# Patient Record
Sex: Female | Born: 1960 | Race: White | Hispanic: No | Marital: Married | Smoking: Former smoker
Health system: Southern US, Community
[De-identification: ages and names within clinical notes are randomized; demographics above are authoritative.]

## PROBLEM LIST (undated history)

## (undated) DIAGNOSIS — R011 Cardiac murmur, unspecified: Secondary | ICD-10-CM

## (undated) DIAGNOSIS — R112 Nausea with vomiting, unspecified: Secondary | ICD-10-CM

## (undated) DIAGNOSIS — Z9889 Other specified postprocedural states: Secondary | ICD-10-CM

## (undated) DIAGNOSIS — E559 Vitamin D deficiency, unspecified: Secondary | ICD-10-CM

## (undated) DIAGNOSIS — G4730 Sleep apnea, unspecified: Secondary | ICD-10-CM

## (undated) DIAGNOSIS — I10 Essential (primary) hypertension: Secondary | ICD-10-CM

## (undated) DIAGNOSIS — M549 Dorsalgia, unspecified: Secondary | ICD-10-CM

## (undated) DIAGNOSIS — F32A Depression, unspecified: Secondary | ICD-10-CM

## (undated) DIAGNOSIS — K219 Gastro-esophageal reflux disease without esophagitis: Secondary | ICD-10-CM

## (undated) DIAGNOSIS — E785 Hyperlipidemia, unspecified: Secondary | ICD-10-CM

## (undated) DIAGNOSIS — Z8619 Personal history of other infectious and parasitic diseases: Secondary | ICD-10-CM

## (undated) DIAGNOSIS — F419 Anxiety disorder, unspecified: Secondary | ICD-10-CM

## (undated) DIAGNOSIS — E042 Nontoxic multinodular goiter: Secondary | ICD-10-CM

## (undated) DIAGNOSIS — Z972 Presence of dental prosthetic device (complete) (partial): Secondary | ICD-10-CM

## (undated) DIAGNOSIS — T7840XA Allergy, unspecified, initial encounter: Secondary | ICD-10-CM

## (undated) DIAGNOSIS — G8929 Other chronic pain: Secondary | ICD-10-CM

## (undated) DIAGNOSIS — F329 Major depressive disorder, single episode, unspecified: Secondary | ICD-10-CM

## (undated) DIAGNOSIS — G43909 Migraine, unspecified, not intractable, without status migrainosus: Secondary | ICD-10-CM

## (undated) DIAGNOSIS — T753XXA Motion sickness, initial encounter: Secondary | ICD-10-CM

## (undated) DIAGNOSIS — M797 Fibromyalgia: Secondary | ICD-10-CM

## (undated) DIAGNOSIS — M5030 Other cervical disc degeneration, unspecified cervical region: Secondary | ICD-10-CM

## (undated) HISTORY — DX: Essential (primary) hypertension: I10

## (undated) HISTORY — DX: Migraine, unspecified, not intractable, without status migrainosus: G43.909

## (undated) HISTORY — DX: Nontoxic multinodular goiter: E04.2

## (undated) HISTORY — DX: Personal history of other infectious and parasitic diseases: Z86.19

## (undated) HISTORY — DX: Cardiac murmur, unspecified: R01.1

## (undated) HISTORY — PX: TONSILLECTOMY: SUR1361

## (undated) HISTORY — PX: TOTAL ABDOMINAL HYSTERECTOMY: SHX209

## (undated) HISTORY — DX: Gastro-esophageal reflux disease without esophagitis: K21.9

## (undated) HISTORY — DX: Other chronic pain: G89.29

## (undated) HISTORY — DX: Major depressive disorder, single episode, unspecified: F32.9

## (undated) HISTORY — PX: KNEE SURGERY: SHX244

## (undated) HISTORY — DX: Fibromyalgia: M79.7

## (undated) HISTORY — PX: LAPAROSCOPY: SHX197

## (undated) HISTORY — DX: Sleep apnea, unspecified: G47.30

## (undated) HISTORY — DX: Hyperlipidemia, unspecified: E78.5

## (undated) HISTORY — PX: CARPAL TUNNEL RELEASE: SHX101

## (undated) HISTORY — PX: ANTERIOR CERVICAL DECOMP/DISCECTOMY FUSION: SHX1161

## (undated) HISTORY — DX: Depression, unspecified: F32.A

## (undated) HISTORY — PX: HERNIA REPAIR: SHX51

## (undated) HISTORY — PX: NISSEN FUNDOPLICATION: SHX2091

## (undated) HISTORY — PX: NECK SURGERY: SHX720

## (undated) HISTORY — DX: Anxiety disorder, unspecified: F41.9

## (undated) HISTORY — PX: BLADDER SUSPENSION: SHX72

## (undated) HISTORY — PX: CHOLECYSTECTOMY: SHX55

## (undated) HISTORY — DX: Other cervical disc degeneration, unspecified cervical region: M50.30

## (undated) HISTORY — DX: Vitamin D deficiency, unspecified: E55.9

## (undated) HISTORY — DX: Allergy, unspecified, initial encounter: T78.40XA

## (undated) HISTORY — DX: Dorsalgia, unspecified: M54.9

---

## 2003-08-02 ENCOUNTER — Other Ambulatory Visit: Payer: Self-pay

## 2004-04-27 ENCOUNTER — Ambulatory Visit: Payer: Self-pay | Admitting: Obstetrics and Gynecology

## 2004-06-09 ENCOUNTER — Ambulatory Visit: Payer: Self-pay | Admitting: Obstetrics and Gynecology

## 2004-06-16 ENCOUNTER — Ambulatory Visit: Payer: Self-pay | Admitting: Pain Medicine

## 2004-06-24 ENCOUNTER — Ambulatory Visit: Payer: Self-pay | Admitting: Pain Medicine

## 2004-08-13 ENCOUNTER — Ambulatory Visit: Payer: Self-pay | Admitting: Pain Medicine

## 2004-08-19 ENCOUNTER — Ambulatory Visit: Payer: Self-pay | Admitting: Pain Medicine

## 2004-09-01 ENCOUNTER — Encounter: Admission: RE | Admit: 2004-09-01 | Discharge: 2004-09-01 | Payer: Self-pay | Admitting: Neurological Surgery

## 2004-09-08 ENCOUNTER — Ambulatory Visit: Payer: Self-pay | Admitting: Pain Medicine

## 2004-09-09 ENCOUNTER — Ambulatory Visit: Payer: Self-pay | Admitting: Pain Medicine

## 2004-09-16 ENCOUNTER — Ambulatory Visit (HOSPITAL_COMMUNITY): Admission: RE | Admit: 2004-09-16 | Discharge: 2004-09-16 | Payer: Self-pay | Admitting: Neurological Surgery

## 2004-09-28 ENCOUNTER — Encounter: Admission: RE | Admit: 2004-09-28 | Discharge: 2004-09-28 | Payer: Self-pay | Admitting: Neurological Surgery

## 2004-10-08 ENCOUNTER — Ambulatory Visit: Payer: Self-pay | Admitting: Pain Medicine

## 2004-10-14 ENCOUNTER — Ambulatory Visit: Payer: Self-pay | Admitting: Pain Medicine

## 2004-10-29 ENCOUNTER — Ambulatory Visit (HOSPITAL_COMMUNITY): Admission: RE | Admit: 2004-10-29 | Discharge: 2004-10-29 | Payer: Self-pay | Admitting: Neurological Surgery

## 2004-11-19 ENCOUNTER — Ambulatory Visit: Payer: Self-pay | Admitting: Neurological Surgery

## 2004-12-08 ENCOUNTER — Ambulatory Visit: Payer: Self-pay | Admitting: Pain Medicine

## 2004-12-16 ENCOUNTER — Ambulatory Visit: Payer: Self-pay | Admitting: Pain Medicine

## 2004-12-31 ENCOUNTER — Ambulatory Visit: Payer: Self-pay | Admitting: Pain Medicine

## 2005-01-13 ENCOUNTER — Ambulatory Visit: Payer: Self-pay | Admitting: Pain Medicine

## 2005-01-28 ENCOUNTER — Ambulatory Visit: Payer: Self-pay | Admitting: Pain Medicine

## 2005-02-01 ENCOUNTER — Ambulatory Visit: Payer: Self-pay | Admitting: Pain Medicine

## 2005-02-23 ENCOUNTER — Ambulatory Visit: Payer: Self-pay | Admitting: Pain Medicine

## 2005-02-24 ENCOUNTER — Ambulatory Visit: Payer: Self-pay | Admitting: Pain Medicine

## 2005-03-22 ENCOUNTER — Ambulatory Visit: Payer: Self-pay | Admitting: Pain Medicine

## 2005-04-06 ENCOUNTER — Ambulatory Visit: Payer: Self-pay | Admitting: Pain Medicine

## 2005-04-14 ENCOUNTER — Ambulatory Visit: Payer: Self-pay | Admitting: Pain Medicine

## 2005-04-15 ENCOUNTER — Ambulatory Visit: Payer: Self-pay | Admitting: Pain Medicine

## 2005-05-04 ENCOUNTER — Ambulatory Visit: Payer: Self-pay | Admitting: Pain Medicine

## 2005-05-11 ENCOUNTER — Ambulatory Visit: Payer: Self-pay | Admitting: Orthopaedic Surgery

## 2005-06-03 ENCOUNTER — Ambulatory Visit: Payer: Self-pay | Admitting: Pain Medicine

## 2005-06-16 ENCOUNTER — Ambulatory Visit: Payer: Self-pay | Admitting: Pain Medicine

## 2005-06-24 ENCOUNTER — Ambulatory Visit: Payer: Self-pay | Admitting: Family Medicine

## 2005-07-01 ENCOUNTER — Ambulatory Visit: Payer: Self-pay | Admitting: Pain Medicine

## 2005-07-14 ENCOUNTER — Ambulatory Visit: Payer: Self-pay | Admitting: Pain Medicine

## 2005-08-03 ENCOUNTER — Ambulatory Visit: Payer: Self-pay | Admitting: Pain Medicine

## 2005-08-03 ENCOUNTER — Ambulatory Visit: Payer: Self-pay | Admitting: Neurological Surgery

## 2005-08-25 ENCOUNTER — Ambulatory Visit: Payer: Self-pay | Admitting: Pain Medicine

## 2005-09-28 ENCOUNTER — Ambulatory Visit: Payer: Self-pay | Admitting: Pain Medicine

## 2005-10-06 ENCOUNTER — Ambulatory Visit: Payer: Self-pay | Admitting: Pain Medicine

## 2005-10-26 ENCOUNTER — Ambulatory Visit: Payer: Self-pay | Admitting: Pain Medicine

## 2005-11-08 ENCOUNTER — Ambulatory Visit: Payer: Self-pay | Admitting: Pain Medicine

## 2005-11-30 ENCOUNTER — Ambulatory Visit: Payer: Self-pay | Admitting: Pain Medicine

## 2005-12-29 ENCOUNTER — Ambulatory Visit: Payer: Self-pay | Admitting: Pain Medicine

## 2006-02-01 ENCOUNTER — Ambulatory Visit: Payer: Self-pay | Admitting: Pain Medicine

## 2006-02-09 ENCOUNTER — Ambulatory Visit: Payer: Self-pay | Admitting: Pain Medicine

## 2006-03-02 ENCOUNTER — Ambulatory Visit: Payer: Self-pay | Admitting: Pain Medicine

## 2006-03-24 ENCOUNTER — Ambulatory Visit: Payer: Self-pay | Admitting: Unknown Physician Specialty

## 2006-03-29 ENCOUNTER — Ambulatory Visit: Payer: Self-pay | Admitting: Pain Medicine

## 2006-04-04 ENCOUNTER — Ambulatory Visit: Payer: Self-pay | Admitting: Unknown Physician Specialty

## 2006-04-13 ENCOUNTER — Ambulatory Visit: Payer: Self-pay | Admitting: Surgery

## 2006-04-26 ENCOUNTER — Ambulatory Visit: Payer: Self-pay | Admitting: Surgery

## 2006-05-03 ENCOUNTER — Ambulatory Visit: Payer: Self-pay | Admitting: Pain Medicine

## 2006-05-11 ENCOUNTER — Ambulatory Visit: Payer: Self-pay | Admitting: Pain Medicine

## 2006-06-02 ENCOUNTER — Ambulatory Visit: Payer: Self-pay | Admitting: Pain Medicine

## 2006-06-06 ENCOUNTER — Ambulatory Visit: Payer: Self-pay | Admitting: Pain Medicine

## 2006-07-02 ENCOUNTER — Emergency Department: Payer: Self-pay | Admitting: Internal Medicine

## 2006-07-11 ENCOUNTER — Ambulatory Visit: Payer: Self-pay | Admitting: Pain Medicine

## 2006-07-13 ENCOUNTER — Ambulatory Visit: Payer: Self-pay | Admitting: Unknown Physician Specialty

## 2006-08-08 ENCOUNTER — Ambulatory Visit: Payer: Self-pay | Admitting: Neurology

## 2006-08-08 ENCOUNTER — Ambulatory Visit: Payer: Self-pay | Admitting: Pain Medicine

## 2006-08-17 ENCOUNTER — Ambulatory Visit: Payer: Self-pay | Admitting: Pain Medicine

## 2006-09-06 ENCOUNTER — Ambulatory Visit: Payer: Self-pay | Admitting: Pain Medicine

## 2006-09-14 ENCOUNTER — Ambulatory Visit: Payer: Self-pay | Admitting: Pain Medicine

## 2006-09-29 ENCOUNTER — Ambulatory Visit: Payer: Self-pay | Admitting: Family Medicine

## 2006-10-04 ENCOUNTER — Ambulatory Visit: Payer: Self-pay | Admitting: Pain Medicine

## 2006-10-12 ENCOUNTER — Ambulatory Visit: Payer: Self-pay | Admitting: Pain Medicine

## 2006-11-03 ENCOUNTER — Ambulatory Visit: Payer: Self-pay | Admitting: Pain Medicine

## 2006-11-09 ENCOUNTER — Ambulatory Visit: Payer: Self-pay | Admitting: Pain Medicine

## 2006-12-01 ENCOUNTER — Ambulatory Visit: Payer: Self-pay | Admitting: Pain Medicine

## 2006-12-14 ENCOUNTER — Ambulatory Visit: Payer: Self-pay | Admitting: Pain Medicine

## 2006-12-27 ENCOUNTER — Ambulatory Visit: Payer: Self-pay | Admitting: Pain Medicine

## 2007-01-11 ENCOUNTER — Ambulatory Visit: Payer: Self-pay | Admitting: Pain Medicine

## 2007-01-30 ENCOUNTER — Ambulatory Visit: Payer: Self-pay | Admitting: Pain Medicine

## 2007-02-23 ENCOUNTER — Ambulatory Visit: Payer: Self-pay | Admitting: Pain Medicine

## 2007-02-27 ENCOUNTER — Ambulatory Visit: Payer: Self-pay | Admitting: Pain Medicine

## 2007-03-30 ENCOUNTER — Ambulatory Visit: Payer: Self-pay | Admitting: Pain Medicine

## 2007-04-12 ENCOUNTER — Ambulatory Visit: Payer: Self-pay | Admitting: Pain Medicine

## 2007-04-27 ENCOUNTER — Ambulatory Visit: Payer: Self-pay | Admitting: Pain Medicine

## 2007-05-08 ENCOUNTER — Ambulatory Visit: Payer: Self-pay | Admitting: Unknown Physician Specialty

## 2007-05-22 ENCOUNTER — Ambulatory Visit: Payer: Self-pay | Admitting: Pain Medicine

## 2007-06-14 ENCOUNTER — Other Ambulatory Visit: Payer: Self-pay

## 2007-06-14 ENCOUNTER — Ambulatory Visit: Payer: Self-pay | Admitting: Surgery

## 2007-06-19 ENCOUNTER — Inpatient Hospital Stay: Payer: Self-pay | Admitting: Surgery

## 2007-06-26 ENCOUNTER — Ambulatory Visit: Payer: Self-pay | Admitting: Pain Medicine

## 2007-07-27 ENCOUNTER — Ambulatory Visit: Payer: Self-pay | Admitting: Pain Medicine

## 2007-07-31 ENCOUNTER — Ambulatory Visit: Payer: Self-pay | Admitting: Pain Medicine

## 2007-08-17 ENCOUNTER — Ambulatory Visit: Payer: Self-pay | Admitting: Pain Medicine

## 2007-08-28 ENCOUNTER — Ambulatory Visit: Payer: Self-pay | Admitting: Pain Medicine

## 2007-09-26 ENCOUNTER — Ambulatory Visit: Payer: Self-pay | Admitting: Pain Medicine

## 2007-10-02 ENCOUNTER — Ambulatory Visit: Payer: Self-pay | Admitting: Pain Medicine

## 2007-10-16 ENCOUNTER — Ambulatory Visit: Payer: Self-pay | Admitting: Pain Medicine

## 2007-11-01 ENCOUNTER — Ambulatory Visit: Payer: Self-pay | Admitting: Pain Medicine

## 2007-12-07 ENCOUNTER — Ambulatory Visit: Payer: Self-pay | Admitting: Family Medicine

## 2007-12-19 ENCOUNTER — Ambulatory Visit: Payer: Self-pay | Admitting: Pain Medicine

## 2007-12-25 ENCOUNTER — Ambulatory Visit: Payer: Self-pay | Admitting: Pain Medicine

## 2008-01-03 ENCOUNTER — Ambulatory Visit: Payer: Self-pay | Admitting: Pain Medicine

## 2008-01-23 ENCOUNTER — Ambulatory Visit: Payer: Self-pay | Admitting: Pain Medicine

## 2008-02-07 ENCOUNTER — Ambulatory Visit: Payer: Self-pay | Admitting: Pain Medicine

## 2008-02-22 ENCOUNTER — Ambulatory Visit: Payer: Self-pay | Admitting: Pain Medicine

## 2008-02-28 ENCOUNTER — Ambulatory Visit: Payer: Self-pay | Admitting: Pain Medicine

## 2008-03-21 ENCOUNTER — Ambulatory Visit: Payer: Self-pay | Admitting: Pain Medicine

## 2008-04-10 ENCOUNTER — Ambulatory Visit: Payer: Self-pay | Admitting: Pain Medicine

## 2008-05-28 ENCOUNTER — Ambulatory Visit: Payer: Self-pay | Admitting: Pain Medicine

## 2008-06-03 ENCOUNTER — Ambulatory Visit: Payer: Self-pay | Admitting: Pain Medicine

## 2008-06-25 ENCOUNTER — Ambulatory Visit: Payer: Self-pay | Admitting: Pain Medicine

## 2008-07-03 ENCOUNTER — Ambulatory Visit: Payer: Self-pay | Admitting: Pain Medicine

## 2008-08-01 ENCOUNTER — Ambulatory Visit: Payer: Self-pay | Admitting: Pain Medicine

## 2008-08-07 ENCOUNTER — Ambulatory Visit: Payer: Self-pay | Admitting: Pain Medicine

## 2008-09-10 ENCOUNTER — Ambulatory Visit: Payer: Self-pay | Admitting: Pain Medicine

## 2008-10-22 ENCOUNTER — Ambulatory Visit: Payer: Self-pay | Admitting: Pain Medicine

## 2008-10-30 ENCOUNTER — Ambulatory Visit: Payer: Self-pay | Admitting: Pain Medicine

## 2008-11-21 ENCOUNTER — Ambulatory Visit: Payer: Self-pay | Admitting: Pain Medicine

## 2008-12-04 ENCOUNTER — Ambulatory Visit: Payer: Self-pay | Admitting: Pain Medicine

## 2008-12-19 ENCOUNTER — Ambulatory Visit: Payer: Self-pay | Admitting: Pain Medicine

## 2009-01-01 ENCOUNTER — Ambulatory Visit: Payer: Self-pay | Admitting: Family Medicine

## 2009-01-13 ENCOUNTER — Ambulatory Visit: Payer: Self-pay | Admitting: Family Medicine

## 2009-01-16 ENCOUNTER — Ambulatory Visit: Payer: Self-pay | Admitting: Pain Medicine

## 2009-01-17 ENCOUNTER — Ambulatory Visit: Payer: Self-pay | Admitting: Pain Medicine

## 2009-03-04 ENCOUNTER — Ambulatory Visit: Payer: Self-pay | Admitting: Pain Medicine

## 2009-03-12 ENCOUNTER — Ambulatory Visit: Payer: Self-pay | Admitting: Pain Medicine

## 2009-04-03 ENCOUNTER — Ambulatory Visit: Payer: Self-pay | Admitting: Pain Medicine

## 2009-04-24 ENCOUNTER — Ambulatory Visit: Payer: Self-pay | Admitting: Pain Medicine

## 2009-05-27 ENCOUNTER — Ambulatory Visit: Payer: Self-pay | Admitting: Pain Medicine

## 2009-07-16 ENCOUNTER — Ambulatory Visit: Payer: Self-pay | Admitting: Pain Medicine

## 2009-08-12 ENCOUNTER — Ambulatory Visit: Payer: Self-pay | Admitting: Pain Medicine

## 2009-08-20 ENCOUNTER — Ambulatory Visit: Payer: Self-pay | Admitting: Pain Medicine

## 2009-09-15 ENCOUNTER — Ambulatory Visit: Payer: Self-pay | Admitting: Pain Medicine

## 2009-10-16 ENCOUNTER — Ambulatory Visit: Payer: Self-pay | Admitting: Pain Medicine

## 2009-10-29 ENCOUNTER — Ambulatory Visit: Payer: Self-pay | Admitting: Pain Medicine

## 2009-11-12 ENCOUNTER — Ambulatory Visit: Payer: Self-pay | Admitting: Pain Medicine

## 2009-11-26 ENCOUNTER — Ambulatory Visit: Payer: Self-pay | Admitting: Pain Medicine

## 2009-12-11 ENCOUNTER — Ambulatory Visit: Payer: Self-pay | Admitting: Pain Medicine

## 2010-01-21 ENCOUNTER — Ambulatory Visit: Payer: Self-pay | Admitting: Pain Medicine

## 2010-02-18 ENCOUNTER — Ambulatory Visit: Payer: Self-pay | Admitting: Pain Medicine

## 2010-03-18 ENCOUNTER — Ambulatory Visit: Payer: Self-pay | Admitting: Pain Medicine

## 2010-03-23 ENCOUNTER — Ambulatory Visit: Payer: Self-pay | Admitting: Pain Medicine

## 2010-04-28 ENCOUNTER — Ambulatory Visit: Payer: Self-pay | Admitting: Pain Medicine

## 2010-05-11 ENCOUNTER — Ambulatory Visit: Payer: Self-pay | Admitting: Pain Medicine

## 2010-05-26 ENCOUNTER — Ambulatory Visit: Payer: Self-pay | Admitting: Pain Medicine

## 2010-06-03 ENCOUNTER — Ambulatory Visit: Payer: Self-pay | Admitting: Pain Medicine

## 2010-06-25 ENCOUNTER — Ambulatory Visit: Payer: Self-pay | Admitting: Pain Medicine

## 2010-07-08 ENCOUNTER — Ambulatory Visit: Payer: Self-pay | Admitting: Pain Medicine

## 2010-07-30 ENCOUNTER — Ambulatory Visit: Payer: Self-pay | Admitting: Pain Medicine

## 2010-08-27 ENCOUNTER — Ambulatory Visit: Payer: Self-pay | Admitting: Pain Medicine

## 2010-09-09 ENCOUNTER — Ambulatory Visit: Payer: Self-pay | Admitting: Pain Medicine

## 2010-09-24 ENCOUNTER — Ambulatory Visit: Payer: Self-pay | Admitting: Pain Medicine

## 2010-09-24 ENCOUNTER — Ambulatory Visit: Payer: Self-pay | Admitting: Family Medicine

## 2010-10-07 ENCOUNTER — Ambulatory Visit: Payer: Self-pay | Admitting: Family Medicine

## 2010-10-22 ENCOUNTER — Ambulatory Visit: Payer: Self-pay | Admitting: Pain Medicine

## 2010-11-04 ENCOUNTER — Ambulatory Visit: Payer: Self-pay | Admitting: Pain Medicine

## 2010-11-16 ENCOUNTER — Ambulatory Visit: Payer: Self-pay | Admitting: Unknown Physician Specialty

## 2010-11-24 ENCOUNTER — Ambulatory Visit: Payer: Self-pay | Admitting: Pain Medicine

## 2010-12-16 ENCOUNTER — Ambulatory Visit: Payer: Self-pay | Admitting: Pain Medicine

## 2011-01-21 ENCOUNTER — Ambulatory Visit: Payer: Self-pay | Admitting: Pain Medicine

## 2011-02-03 ENCOUNTER — Ambulatory Visit: Payer: Self-pay | Admitting: Pain Medicine

## 2011-02-18 ENCOUNTER — Ambulatory Visit: Payer: Self-pay | Admitting: Pain Medicine

## 2011-03-23 ENCOUNTER — Ambulatory Visit: Payer: Self-pay | Admitting: Pain Medicine

## 2011-04-05 ENCOUNTER — Ambulatory Visit: Payer: Self-pay | Admitting: Unknown Physician Specialty

## 2011-04-07 ENCOUNTER — Ambulatory Visit: Payer: Self-pay | Admitting: Pain Medicine

## 2011-04-13 ENCOUNTER — Ambulatory Visit: Payer: Self-pay | Admitting: Pain Medicine

## 2011-04-20 ENCOUNTER — Ambulatory Visit: Payer: Self-pay | Admitting: Pain Medicine

## 2011-04-26 ENCOUNTER — Ambulatory Visit: Payer: Self-pay | Admitting: Pain Medicine

## 2011-05-31 ENCOUNTER — Ambulatory Visit: Payer: Self-pay | Admitting: Pain Medicine

## 2011-06-07 ENCOUNTER — Ambulatory Visit: Payer: Self-pay | Admitting: Pain Medicine

## 2011-07-13 ENCOUNTER — Ambulatory Visit: Payer: Self-pay | Admitting: Pain Medicine

## 2011-07-26 ENCOUNTER — Ambulatory Visit: Payer: Self-pay | Admitting: Pain Medicine

## 2011-08-13 ENCOUNTER — Ambulatory Visit: Payer: Self-pay | Admitting: Pain Medicine

## 2011-09-27 ENCOUNTER — Ambulatory Visit: Payer: Self-pay | Admitting: Pain Medicine

## 2011-11-03 ENCOUNTER — Ambulatory Visit: Payer: Self-pay | Admitting: Pain Medicine

## 2011-12-02 ENCOUNTER — Ambulatory Visit: Payer: Self-pay | Admitting: Pain Medicine

## 2011-12-15 ENCOUNTER — Ambulatory Visit: Payer: Self-pay | Admitting: Pain Medicine

## 2011-12-30 ENCOUNTER — Ambulatory Visit: Payer: Self-pay | Admitting: Pain Medicine

## 2012-01-24 ENCOUNTER — Ambulatory Visit: Payer: Self-pay | Admitting: Pain Medicine

## 2012-02-22 ENCOUNTER — Ambulatory Visit: Payer: Self-pay | Admitting: Pain Medicine

## 2012-03-01 ENCOUNTER — Ambulatory Visit: Payer: Self-pay | Admitting: Pain Medicine

## 2012-04-18 ENCOUNTER — Ambulatory Visit: Payer: Self-pay | Admitting: Pain Medicine

## 2012-05-11 ENCOUNTER — Ambulatory Visit: Payer: Self-pay | Admitting: Family Medicine

## 2012-06-14 ENCOUNTER — Ambulatory Visit: Payer: Self-pay | Admitting: Pain Medicine

## 2012-06-21 ENCOUNTER — Ambulatory Visit: Payer: Self-pay | Admitting: Pain Medicine

## 2012-07-01 ENCOUNTER — Emergency Department: Payer: Self-pay | Admitting: Emergency Medicine

## 2012-07-01 LAB — COMPREHENSIVE METABOLIC PANEL
Albumin: 4.2 g/dL (ref 3.4–5.0)
Chloride: 101 mmol/L (ref 98–107)
Potassium: 3.9 mmol/L (ref 3.5–5.1)
SGOT(AST): 27 U/L (ref 15–37)
SGPT (ALT): 30 U/L (ref 12–78)
Total Protein: 8.5 g/dL — ABNORMAL HIGH (ref 6.4–8.2)

## 2012-07-01 LAB — CK TOTAL AND CKMB (NOT AT ARMC)
CK, Total: 53 U/L (ref 21–215)
CK-MB: <0.5 ng/mL — ABNORMAL LOW (ref 0.5–3.6)

## 2012-07-01 LAB — CBC
HCT: 43.2 % (ref 35.0–47.0)
HGB: 15 g/dL (ref 12.0–16.0)
MCH: 32.1 pg (ref 26.0–34.0)
MCHC: 34.8 g/dL (ref 32.0–36.0)
RDW: 12.2 % (ref 11.5–14.5)

## 2012-07-01 LAB — TROPONIN I: Troponin-I: <0.02 ng/mL

## 2012-07-02 LAB — TROPONIN I: Troponin-I: <0.02 ng/mL

## 2012-07-18 ENCOUNTER — Ambulatory Visit: Payer: Self-pay | Admitting: Pain Medicine

## 2012-07-26 ENCOUNTER — Ambulatory Visit: Payer: Self-pay | Admitting: Pain Medicine

## 2012-09-06 ENCOUNTER — Ambulatory Visit: Payer: Self-pay | Admitting: Pain Medicine

## 2012-09-27 ENCOUNTER — Ambulatory Visit: Payer: Self-pay | Admitting: Pain Medicine

## 2012-10-02 ENCOUNTER — Emergency Department: Payer: Self-pay | Admitting: Emergency Medicine

## 2012-10-02 LAB — COMPREHENSIVE METABOLIC PANEL
Anion Gap: 10 (ref 7–16)
Calcium, Total: 9.6 mg/dL (ref 8.5–10.1)
Chloride: 105 mmol/L (ref 98–107)
Glucose: 107 mg/dL — ABNORMAL HIGH (ref 65–99)
Osmolality: 275 (ref 275–301)
Potassium: 3.9 mmol/L (ref 3.5–5.1)
SGPT (ALT): 22 U/L (ref 12–78)
Sodium: 137 mmol/L (ref 136–145)

## 2012-10-02 LAB — CBC
HCT: 38.2 % (ref 35.0–47.0)
MCHC: 34.7 g/dL (ref 32.0–36.0)
MCV: 91 fL (ref 80–100)
WBC: 8.5 10*3/uL (ref 3.6–11.0)

## 2012-10-02 LAB — CK TOTAL AND CKMB (NOT AT ARMC)
CK, Total: 63 U/L (ref 21–215)
CK-MB: 0.9 ng/mL (ref 0.5–3.6)

## 2012-10-02 LAB — TROPONIN I: Troponin-I: <0.02 ng/mL

## 2012-10-05 ENCOUNTER — Ambulatory Visit: Payer: Self-pay | Admitting: Family Medicine

## 2012-10-12 ENCOUNTER — Ambulatory Visit: Payer: Self-pay | Admitting: Family Medicine

## 2012-10-18 ENCOUNTER — Encounter: Payer: Self-pay | Admitting: *Deleted

## 2012-10-26 ENCOUNTER — Ambulatory Visit (INDEPENDENT_AMBULATORY_CARE_PROVIDER_SITE_OTHER): Payer: Medicare Other | Admitting: Cardiovascular Disease

## 2012-10-26 ENCOUNTER — Encounter: Payer: Self-pay | Admitting: Cardiovascular Disease

## 2012-10-26 VITALS — BP 140/92 | HR 58 | Ht 71.0 in | Wt 200.2 lb

## 2012-10-26 DIAGNOSIS — R002 Palpitations: Secondary | ICD-10-CM

## 2012-10-26 DIAGNOSIS — R079 Chest pain, unspecified: Secondary | ICD-10-CM | POA: Insufficient documentation

## 2012-10-26 DIAGNOSIS — R000 Tachycardia, unspecified: Secondary | ICD-10-CM

## 2012-10-26 NOTE — Progress Notes (Signed)
Primary care physician: Burnell Blanks, MD  HPI  This is a pleasant 52 year old female who is self-referred for evaluation of palpitations and chest pain. She reports being diagnosed with hypertension at the age of 83 after she had preeclampsia. She has been hypertensive since then. There is strong family history of hypertension at an early age. She reports having scarlet fever at the age of 67 with a heart murmur at that time. However, she reports having an echocardiogram at [facility] with no significant abnormalities. She suffers from fibromyalgia and chronic back pain. She is not aware of other cardiac history. She is known to have hyperlipidemia. She quit smoking about 3 years ago. She has been having increased palpitations mostly at night and morning when she is resting. She describes skipping in her heart followed by a pause. There has been no persistent tachycardia. This has not been associated with dizziness, syncope or presyncope. She had a Holter monitor done recently at [facility] which showed normal sinus rhythm with rare PACs and PVCs. She also complains of upper chest discomfort radiating to her neck described as an aching sensation mostly happening with stress. She is not able to do much physical activity due to chronic back pain. She has mild exertional dyspnea.  Allergies  Allergen Reactions  . Codeine   . Hydrocodone   . Morphine And Related      No current outpatient prescriptions on file prior to visit.   No current facility-administered medications on file prior to visit.     Past Medical History  Diagnosis Date  . GERD (gastroesophageal reflux disease)   . Fibromyalgia   . H/O scarlet fever   . Heart murmur     hx  . Hypertension   . Hyperlipidemia   . Multiple thyroid nodules   . DDD (degenerative disc disease), cervical     lumbar     Past Surgical History  Procedure Laterality Date  . Tonsillectomy    . Laparoscopy    . Total abdominal hysterectomy    .  Cholecystectomy    . Carpal tunnel release Bilateral   . Nissen fundoplication    . Anterior cervical decomp/discectomy fusion    . Hernia repair    . Bladder suspension    . Knee surgery Left      Family History  Problem Relation Age of Onset  . Heart disease Mother   . Heart attack Mother     x2     History   Social History  . Marital Status: Single    Spouse Name: N/A    Number of Children: N/A  . Years of Education: N/A   Occupational History  . Not on file.   Social History Main Topics  . Smoking status: Former Smoker -- 0.50 packs/day for 20 years    Types: Cigarettes  . Smokeless tobacco: Not on file  . Alcohol Use: Yes     Comment: 1 month  . Drug Use: No  . Sexually Active: Not on file   Other Topics Concern  . Not on file   Social History Narrative  . No narrative on file     ROS Constitutional: Negative for fever, chills, diaphoresis, activity change, appetite change and fatigue.  HENT: Negative for hearing loss, nosebleeds, congestion, sore throat, facial swelling, drooling, trouble swallowing, neck pain, voice change, sinus pressure and tinnitus.  Eyes: Negative for photophobia, pain, discharge and visual disturbance.  Respiratory: Negative for apnea, cough and wheezing.  Cardiovascular: Negative for  leg swelling.  Gastrointestinal: Negative for nausea, vomiting, abdominal pain, diarrhea, constipation, blood in stool and abdominal distention.  Genitourinary: Negative for dysuria, urgency, frequency, hematuria and decreased urine volume.  Musculoskeletal: Negative for myalgias, back pain, joint swelling, arthralgias and gait problem.  Skin: Negative for color change, pallor, rash and wound.  Neurological: Negative for dizziness, tremors, seizures, syncope, speech difficulty, weakness, light-headedness, numbness and headaches.  Psychiatric/Behavioral: Negative for suicidal ideas, hallucinations, behavioral problems and agitation. The patient is not  nervous/anxious.     PHYSICAL EXAM   BP 140/92  Pulse 58  Ht 5\' 11"  (1.803 m)  Wt 200 lb 4 oz (90.833 kg)  BMI 27.94 kg/m2 Constitutional: She is oriented to person, place, and time. She appears well-developed and well-nourished. No distress.  HENT: No nasal discharge.  Head: Normocephalic and atraumatic.  Eyes: Pupils are equal and round. Right eye exhibits no discharge. Left eye exhibits no discharge.  Neck: Normal range of motion. Neck supple. No JVD present. No thyromegaly present.  Cardiovascular: Normal rate, regular rhythm, normal heart sounds. Exam reveals no gallop and no friction rub. No murmur heard.  Pulmonary/Chest: Effort normal and breath sounds normal. No stridor. No respiratory distress. She has no wheezes. She has no rales. She exhibits no tenderness.  Abdominal: Soft. Bowel sounds are normal. She exhibits no distension. There is no tenderness. There is no rebound and no guarding.  Musculoskeletal: Normal range of motion. She exhibits no edema and no tenderness.  Neurological: She is alert and oriented to person, place, and time. Coordination normal.  Skin: Skin is warm and dry. No rash noted. She is not diaphoretic. No erythema. No pallor.  Psychiatric: She has a normal mood and affect. Her behavior is normal. Judgment and thought content normal.     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   ABNORMAL    ASSESSMENT AND PLAN

## 2012-10-26 NOTE — Assessment & Plan Note (Signed)
Her symptoms are suggestive of premature beats. Recent Holter monitor showed rare PACs and PVCs. Given his significantly abnormal EKG and her symptoms of palpitations, I will obtain an echocardiogram to make sure she has no other structural heart abnormalities.

## 2012-10-26 NOTE — Patient Instructions (Addendum)
Your physician has requested that you have a lexiscan myoview. For further information please visit [email or web address]. Please follow instruction sheet, as given.  Your physician has requested that you have an echocardiogram. Echocardiography is a painless test that uses sound waves to create images of your heart. It provides your doctor with information about the size and shape of your heart and how well your heart's chambers and valves are working. This procedure takes approximately one hour. There are no restrictions for this procedure.  Follow up after tests.  

## 2012-10-26 NOTE — Assessment & Plan Note (Signed)
Her chest pain is somewhat atypical. 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 This was also evident on previous EKGs. She is not aware of previous myocardial infarction. I recommend further evaluation with a pharmacologic nuclear stress test. She's not able to exercise on a treadmill due to chronic back pain.

## 2012-10-31 ENCOUNTER — Ambulatory Visit: Payer: Self-pay | Admitting: Pain Medicine

## 2012-11-06 ENCOUNTER — Telehealth: Payer: Self-pay | Admitting: *Deleted

## 2012-11-06 NOTE — Telephone Encounter (Signed)
Pt wants to know if she can have her stress test moved up. States that she is continuing to have CP

## 2012-11-06 NOTE — Telephone Encounter (Signed)
lmtcb

## 2012-11-07 ENCOUNTER — Other Ambulatory Visit (INDEPENDENT_AMBULATORY_CARE_PROVIDER_SITE_OTHER): Payer: Medicare Other

## 2012-11-07 ENCOUNTER — Other Ambulatory Visit: Payer: Self-pay

## 2012-11-07 DIAGNOSIS — R000 Tachycardia, unspecified: Secondary | ICD-10-CM

## 2012-11-07 DIAGNOSIS — R079 Chest pain, unspecified: Secondary | ICD-10-CM

## 2012-11-07 NOTE — Telephone Encounter (Signed)
I called # listed on note. A woman answered and tells me this is the wrong #. I took # off contact list and tried calling alternate # we had listed  [email or web address]

## 2012-11-08 NOTE — Progress Notes (Signed)
x2 lmtcb

## 2012-11-08 NOTE — Progress Notes (Signed)
lmtcb

## 2012-11-08 NOTE — Progress Notes (Signed)
Pt informed of echo results.

## 2012-11-10 NOTE — Telephone Encounter (Signed)
I have attempted to reach pt several times re:CP. Her stress test is scheduled for 5/12. Will leave as scheduled

## 2012-11-13 ENCOUNTER — Ambulatory Visit: Payer: Self-pay | Admitting: Cardiovascular Disease

## 2012-11-13 ENCOUNTER — Other Ambulatory Visit: Payer: Self-pay | Admitting: *Deleted

## 2012-11-13 DIAGNOSIS — R079 Chest pain, unspecified: Secondary | ICD-10-CM

## 2012-11-20 ENCOUNTER — Encounter: Payer: Self-pay | Admitting: Cardiovascular Disease

## 2012-11-20 ENCOUNTER — Ambulatory Visit (INDEPENDENT_AMBULATORY_CARE_PROVIDER_SITE_OTHER): Payer: Managed Care, Other (non HMO) | Admitting: Cardiovascular Disease

## 2012-11-20 VITALS — BP 122/70 | HR 76 | Ht 71.0 in | Wt 202.2 lb

## 2012-11-20 DIAGNOSIS — R002 Palpitations: Secondary | ICD-10-CM

## 2012-11-20 DIAGNOSIS — R079 Chest pain, unspecified: Secondary | ICD-10-CM

## 2012-11-20 NOTE — Assessment & Plan Note (Signed)
Controlled with current dose of metoprolol.

## 2012-11-20 NOTE — Patient Instructions (Addendum)
Your physician has requested that you have a cardiac catheterization. Cardiac catheterization is used to diagnose and/or treat various heart conditions. Doctors may recommend this procedure for a number of different reasons. The most common reason is to evaluate chest pain. Chest pain can be a symptom of coronary artery disease (CAD), and cardiac catheterization can show whether plaque is narrowing or blocking your heart's arteries. This procedure is also used to evaluate the valves, as well as measure the blood flow and oxygen levels in different parts of your heart. For further information please visit [email or web address] Please follow instruction sheet, as given.  Please go by [facility] Entrance to have Chest X Ray at your convenience prior to Thursday 5/22

## 2012-11-20 NOTE — Progress Notes (Signed)
Primary care physician: Burnell Blanks, MD  HPI  This is a pleasant 52 year old female who is here today for followup visit regarding palpitations and chest pain. She reports being diagnosed with hypertension at the age of 63 after she had preeclampsia. She has been hypertensive since then. There is strong family history of hypertension at an early age. She reports having scarlet fever at the age of 36 with a heart murmur at that time.  She suffers from fibromyalgia and chronic back pain. She is not aware of other cardiac history. She is known to have hyperlipidemia. She quit smoking about 3 years ago. She has been having increased palpitations mostly at night and morning when she is resting. She describes skipping in her heart followed by a pause. There has been no persistent tachycardia. This has not been associated with dizziness, syncope or presyncope. She had a Holter monitor done last month at [facility] which showed normal sinus rhythm with rare PACs and PVCs.She is not able to do much physical activity due to chronic back pain. She has mild exertional dyspnea. She underwent an echocardiogram which overall was unremarkable. She had a pharmacologic nuclear stress test which showed possible distal anterior wall ischemia with normal ejection fraction. She continues to describe frequent episodes of substernal aching sensation happening both with activities and at rest when she is under stress. It is usually last for 5-15 minutes.  Allergies  Allergen Reactions  . Codeine   . Hydrocodone   . Morphine And Related      Current Outpatient Prescriptions on File Prior to Visit  Medication Sig Dispense Refill  . buPROPion (WELLBUTRIN SR) 150 MG 12 hr tablet Take 150 mg by mouth daily.      . busPIRone (BUSPAR) 30 MG tablet Take 30 mg by mouth 2 (two) times daily.      Marland Kitchen estrogens, conjugated, (PREMARIN) 0.3 MG tablet Take 0.3 mg by mouth at bedtime. Take daily for 21 days then do not take for 7 days.       Marland Kitchen ezetimibe (ZETIA) 10 MG tablet Take 10 mg by mouth daily.      . fenofibrate micronized (LOFIBRA) 134 MG capsule Take 134 mg by mouth daily before breakfast.      . gabapentin (NEURONTIN) 600 MG tablet Take 600 mg by mouth 2 (two) times daily.      . methadone (DOLOPHINE) 5 MG tablet Take 5 mg by mouth every 4 (four) hours as needed for pain.      . metoprolol succinate (TOPROL-XL) 50 MG 24 hr tablet Take 50 mg by mouth 2 (two) times daily. Take with or immediately following a meal.      . Multiple Vitamin (MULTIVITAMIN) tablet Take 1 tablet by mouth daily.      . promethazine (PHENERGAN) 25 MG tablet Take 25 mg by mouth every 6 (six) hours as needed for nausea.      . simvastatin (ZOCOR) 80 MG tablet Take 80 mg by mouth at bedtime.      Marland Kitchen tiZANidine (ZANAFLEX) 2 MG tablet 1-2 tablets twice daily.      Marland Kitchen tolterodine (DETROL LA) 4 MG 24 hr capsule Take 4 mg by mouth daily.      Marland Kitchen topiramate (TOPAMAX) 50 MG tablet Take 50 mg by mouth 2 (two) times daily.       No current facility-administered medications on file prior to visit.     Past Medical History  Diagnosis Date  . GERD (gastroesophageal reflux disease)   .  Fibromyalgia   . H/O scarlet fever   . Heart murmur     hx  . Hypertension   . Hyperlipidemia   . Multiple thyroid nodules   . DDD (degenerative disc disease), cervical     lumbar     Past Surgical History  Procedure Laterality Date  . Tonsillectomy    . Laparoscopy    . Total abdominal hysterectomy    . Cholecystectomy    . Carpal tunnel release Bilateral   . Nissen fundoplication    . Anterior cervical decomp/discectomy fusion    . Hernia repair    . Bladder suspension    . Knee surgery Left      Family History  Problem Relation Age of Onset  . Heart disease Mother   . Heart attack Mother     x2     History   Social History  . Marital Status: Single    Spouse Name: N/A    Number of Children: N/A  . Years of Education: N/A   Occupational  History  . Not on file.   Social History Main Topics  . Smoking status: Former Smoker -- 0.50 packs/day for 20 years    Types: Cigarettes  . Smokeless tobacco: Not on file  . Alcohol Use: Yes     Comment: 1 month  . Drug Use: No  . Sexually Active: Not on file   Other Topics Concern  . Not on file   Social History Narrative  . No narrative on file     ROS Constitutional: Negative for fever, chills, diaphoresis, activity change, appetite change and fatigue.  HENT: Negative for hearing loss, nosebleeds, congestion, sore throat, facial swelling, drooling, trouble swallowing, neck pain, voice change, sinus pressure and tinnitus.  Eyes: Negative for photophobia, pain, discharge and visual disturbance.  Respiratory: Negative for apnea, cough and wheezing.  Cardiovascular: Negative for  leg swelling.  Gastrointestinal: Negative for nausea, vomiting, abdominal pain, diarrhea, constipation, blood in stool and abdominal distention.  Genitourinary: Negative for dysuria, urgency, frequency, hematuria and decreased urine volume.  Musculoskeletal: Negative for myalgias, back pain, joint swelling, arthralgias and gait problem.  Skin: Negative for color change, pallor, rash and wound.  Neurological: Negative for dizziness, tremors, seizures, syncope, speech difficulty, weakness, light-headedness, numbness and headaches.  Psychiatric/Behavioral: Negative for suicidal ideas, hallucinations, behavioral problems and agitation. The patient is not nervous/anxious.     PHYSICAL EXAM   BP 122/70  Pulse 76  Ht 5\' 11"  (1.803 m)  Wt 202 lb 4 oz (91.74 kg)  BMI 28.22 kg/m2 Constitutional: She is oriented to person, place, and time. She appears well-developed and well-nourished. No distress.  HENT: No nasal discharge.  Head: Normocephalic and atraumatic.  Eyes: Pupils are equal and round. Right eye exhibits no discharge. Left eye exhibits no discharge.  Neck: Normal range of motion. Neck supple. No  JVD present. No thyromegaly present.  Cardiovascular: Normal rate, regular rhythm, normal heart sounds. Exam reveals no gallop and no friction rub. No murmur heard.  Pulmonary/Chest: Effort normal and breath sounds normal. No stridor. No respiratory distress. She has no wheezes. She has no rales. She exhibits no tenderness.  Abdominal: Soft. Bowel sounds are normal. She exhibits no distension. There is no tenderness. There is no rebound and no guarding.  Musculoskeletal: Normal range of motion. She exhibits no edema and no tenderness.  Neurological: She is alert and oriented to person, place, and time. Coordination normal.  Skin: Skin is warm and dry. No rash  noted. She is not diaphoretic. No erythema. No pallor.  Psychiatric: She has a normal mood and affect. Her behavior is normal. Judgment and thought content normal.     ASSESSMENT AND PLAN

## 2012-11-20 NOTE — Assessment & Plan Note (Signed)
The patient continues to intermittent episodes of chest pain with some anginal in some atypical features . This is difficult to determine due to poor functional capacity related to chronic back pain. She obviously has multiple risk factors for coronary artery disease with abnormal EKG showing previous inferior infarct. Recent nuclear stress test was suggestive of distal anterior wall ischemia. Due to all of that, I recommend proceeding with cardiac catheterization and possible coronary intervention. Risks, benefits and benefits were discussed with her. We also discussed the possibility of obtaining coronary CTA as an alternative test.

## 2012-11-21 LAB — CBC WITH DIFFERENTIAL
Immature Grans (Abs): 0 10*3/uL (ref 0.0–0.1)
Immature Granulocytes: 0 % (ref 0–2)
Lymphocytes Absolute: 2 10*3/uL (ref 0.7–3.1)
Lymphs: 41 % (ref 14–46)
MCHC: 33.9 g/dL (ref 31.5–35.7)
Monocytes: 9 % (ref 4–12)
Platelets: 396 10*3/uL — ABNORMAL HIGH (ref 155–379)
RDW: 13.6 % (ref 12.3–15.4)
WBC: 4.9 10*3/uL (ref 3.4–10.8)

## 2012-11-21 LAB — BASIC METABOLIC PANEL
BUN/Creatinine Ratio: 13 (ref 9–23)
Chloride: 105 mmol/L (ref 97–108)
GFR calc Af Amer: 114 mL/min/{1.73_m2} (ref >59)
GFR calc non Af Amer: 99 mL/min/{1.73_m2} (ref >59)
Glucose: 102 mg/dL — ABNORMAL HIGH (ref 65–99)
Potassium: 4.6 mmol/L (ref 3.5–5.2)
Sodium: 139 mmol/L (ref 134–144)

## 2012-11-21 LAB — PROTIME-INR: INR: 0.9 (ref 0.8–1.2)

## 2012-11-22 ENCOUNTER — Ambulatory Visit: Payer: Self-pay | Admitting: Pain Medicine

## 2012-11-22 ENCOUNTER — Other Ambulatory Visit: Payer: Self-pay

## 2012-11-22 DIAGNOSIS — R079 Chest pain, unspecified: Secondary | ICD-10-CM

## 2012-11-23 ENCOUNTER — Ambulatory Visit: Payer: Self-pay | Admitting: Cardiovascular Disease

## 2012-11-23 DIAGNOSIS — R079 Chest pain, unspecified: Secondary | ICD-10-CM

## 2012-11-23 HISTORY — PX: CARDIAC CATHETERIZATION: SHX172

## 2012-12-05 ENCOUNTER — Ambulatory Visit: Payer: Self-pay | Admitting: Pain Medicine

## 2012-12-06 ENCOUNTER — Encounter: Payer: Self-pay | Admitting: *Deleted

## 2012-12-19 ENCOUNTER — Ambulatory Visit: Payer: Managed Care, Other (non HMO) | Admitting: Cardiovascular Disease

## 2012-12-20 ENCOUNTER — Ambulatory Visit: Payer: Self-pay | Admitting: Pain Medicine

## 2012-12-25 ENCOUNTER — Encounter: Payer: Self-pay | Admitting: Cardiovascular Disease

## 2012-12-25 ENCOUNTER — Ambulatory Visit (INDEPENDENT_AMBULATORY_CARE_PROVIDER_SITE_OTHER): Payer: Medicare Other | Admitting: Cardiovascular Disease

## 2012-12-25 VITALS — BP 100/58 | HR 63 | Ht 71.0 in | Wt 197.8 lb

## 2012-12-25 DIAGNOSIS — R079 Chest pain, unspecified: Secondary | ICD-10-CM

## 2012-12-25 DIAGNOSIS — R002 Palpitations: Secondary | ICD-10-CM

## 2012-12-25 DIAGNOSIS — E785 Hyperlipidemia, unspecified: Secondary | ICD-10-CM

## 2012-12-25 NOTE — Assessment & Plan Note (Signed)
Her symptoms are controlled with metoprolol. Symptoms are Likely due to premature beats.

## 2012-12-25 NOTE — Progress Notes (Signed)
Primary care physician: Burnell Blanks, MD  HPI  This is a pleasant 52 year old female who is here today for followup visit regarding palpitations and chest pain. She has prolonged history of hypertension and hyperlipidemia.  There is strong family history of hypertension at an early age. She reports having scarlet fever at the age of 76 with a heart murmur at that time.  She suffers from fibromyalgia and chronic back pain.  She was seen recently for palpitations mostly at night and morning when she is resting. She had a Holter monitor done last month at [facility] which showed normal sinus rhythm with rare PACs and PVCs.She is not able to do much physical activity due to chronic back pain. Other symptoms included exertional dyspnea and episodes of chest and back discomfort.  She underwent an echocardiogram which overall was unremarkable. She had a pharmacologic nuclear stress test which showed possible distal anterior wall ischemia with normal ejection fraction. Due to that, I proceeded with a left heart catheterization which showed minor luminal irregularities with no evidence of obstructive disease.  Allergies  Allergen Reactions  . Codeine   . Hydrocodone   . Morphine And Related      Current Outpatient Prescriptions on File Prior to Visit  Medication Sig Dispense Refill  . buPROPion (WELLBUTRIN SR) 150 MG 12 hr tablet Take 150 mg by mouth daily.      . busPIRone (BUSPAR) 30 MG tablet Take 30 mg by mouth 2 (two) times daily.      Marland Kitchen esomeprazole (NEXIUM) 40 MG capsule Take 40 mg by mouth 2 (two) times daily.      Marland Kitchen estrogens, conjugated, (PREMARIN) 0.3 MG tablet Take 0.3 mg by mouth at bedtime. Take daily for 21 days then do not take for 7 days.      Marland Kitchen ezetimibe (ZETIA) 10 MG tablet Take 10 mg by mouth daily.      . fenofibrate micronized (LOFIBRA) 134 MG capsule Take 134 mg by mouth daily before breakfast.      . gabapentin (NEURONTIN) 600 MG tablet Take 600 mg by mouth 2 (two) times daily.       . methadone (DOLOPHINE) 5 MG tablet Take 5 mg by mouth every 4 (four) hours as needed for pain.      . metoprolol succinate (TOPROL-XL) 50 MG 24 hr tablet Take 50 mg by mouth daily. Take with or immediately following a meal.      . Multiple Vitamin (MULTIVITAMIN) tablet Take 1 tablet by mouth daily.      . promethazine (PHENERGAN) 25 MG tablet Take 25 mg by mouth every 6 (six) hours as needed for nausea.      . simvastatin (ZOCOR) 80 MG tablet Take 80 mg by mouth at bedtime.      Marland Kitchen tiZANidine (ZANAFLEX) 2 MG tablet 1-2 tablets twice daily.      Marland Kitchen tolterodine (DETROL LA) 4 MG 24 hr capsule Take 4 mg by mouth daily.       No current facility-administered medications on file prior to visit.     Past Medical History  Diagnosis Date  . GERD (gastroesophageal reflux disease)   . Fibromyalgia   . H/O scarlet fever   . Heart murmur     hx  . Hypertension   . Hyperlipidemia   . Multiple thyroid nodules   . DDD (degenerative disc disease), cervical     lumbar     Past Surgical History  Procedure Laterality Date  . Tonsillectomy    .  Laparoscopy    . Total abdominal hysterectomy    . Cholecystectomy    . Carpal tunnel release Bilateral   . Nissen fundoplication    . Anterior cervical decomp/discectomy fusion    . Hernia repair    . Bladder suspension    . Knee surgery Left   . Cardiac catheterization  11/23/12    [facility];EF 65%     Family History  Problem Relation Age of Onset  . Heart disease Mother   . Heart attack Mother     x2     History   Social History  . Marital Status: Single    Spouse Name: N/A    Number of Children: N/A  . Years of Education: N/A   Occupational History  . Not on file.   Social History Main Topics  . Smoking status: Former Smoker -- 0.50 packs/day for 20 years    Types: Cigarettes  . Smokeless tobacco: Not on file  . Alcohol Use: Yes     Comment: 1 month  . Drug Use: No  . Sexually Active: Not on file   Other Topics Concern  .  Not on file   Social History Narrative  . No narrative on file       PHYSICAL EXAM   BP 100/58  Pulse 63  Ht 5\' 11"  (1.803 m)  Wt 197 lb 12 oz (89.699 kg)  BMI 27.59 kg/m2 Constitutional: She is oriented to person, place, and time. She appears well-developed and well-nourished. No distress.  HENT: No nasal discharge.  Head: Normocephalic and atraumatic.  Eyes: Pupils are equal and round. Right eye exhibits no discharge. Left eye exhibits no discharge.  Neck: Normal range of motion. Neck supple. No JVD present. No thyromegaly present.  Cardiovascular: Normal rate, regular rhythm, normal heart sounds. Exam reveals no gallop and no friction rub. No murmur heard.  Pulmonary/Chest: Effort normal and breath sounds normal. No stridor. No respiratory distress. She has no wheezes. She has no rales. She exhibits no tenderness.  Abdominal: Soft. Bowel sounds are normal. She exhibits no distension. There is no tenderness. There is no rebound and no guarding.  Musculoskeletal: Normal range of motion. She exhibits no edema and no tenderness.  Neurological: She is alert and oriented to person, place, and time. Coordination normal.  Skin: Skin is warm and dry. No rash noted. She is not diaphoretic. No erythema. No pallor.  Psychiatric: She has a normal mood and affect. Her behavior is normal. Judgment and thought content normal.  Right radial pulse is normal with no hematoma.  QIO:NGEXB  Rhythm  -Possible old inferior infarct  ABNORMAL    ASSESSMENT AND PLAN

## 2012-12-25 NOTE — Patient Instructions (Addendum)
Follow up as needed

## 2012-12-25 NOTE — Assessment & Plan Note (Addendum)
She seems to have severe mixed hyperlipidemia and currently on fenofibrate, simvastatin 80 mg daily and Zetia 10 mg daily. I suggest switching simvastatin to a smaller dose atorvastatin in order to minimize the risk of drug interaction. I asked her to discuss this with Dr. Nathanial Rancher.

## 2012-12-25 NOTE — Assessment & Plan Note (Signed)
Recent cardiac catheterization showed no evidence of obstructive coronary artery disease. There was only minor luminal irregularities. Continue treatment of risk factors. Followup with me as needed. She does have an abnormal EKG suggestive of previous infarct. However, cardiac imaging showed no evidence of infarcts.

## 2013-01-09 ENCOUNTER — Ambulatory Visit: Payer: Self-pay | Admitting: Pain Medicine

## 2013-01-17 ENCOUNTER — Ambulatory Visit: Payer: Self-pay | Admitting: Pain Medicine

## 2013-01-25 ENCOUNTER — Encounter: Payer: Self-pay | Admitting: *Deleted

## 2013-02-06 ENCOUNTER — Ambulatory Visit: Payer: Self-pay | Admitting: Pain Medicine

## 2013-02-27 ENCOUNTER — Encounter: Payer: Self-pay | Admitting: Cardiovascular Disease

## 2013-03-14 ENCOUNTER — Ambulatory Visit: Payer: Self-pay | Admitting: Pain Medicine

## 2013-03-26 ENCOUNTER — Ambulatory Visit: Payer: Self-pay | Admitting: Pain Medicine

## 2013-04-05 ENCOUNTER — Ambulatory Visit: Payer: Self-pay | Admitting: Pain Medicine

## 2013-05-10 ENCOUNTER — Ambulatory Visit: Payer: Self-pay | Admitting: Pain Medicine

## 2013-05-22 ENCOUNTER — Ambulatory Visit: Payer: Self-pay | Admitting: Family Medicine

## 2013-05-23 ENCOUNTER — Ambulatory Visit: Payer: Self-pay | Admitting: Pain Medicine

## 2013-06-12 ENCOUNTER — Ambulatory Visit: Payer: Self-pay | Admitting: Pain Medicine

## 2013-07-12 ENCOUNTER — Ambulatory Visit: Payer: Self-pay | Admitting: Pain Medicine

## 2013-07-23 ENCOUNTER — Ambulatory Visit: Payer: Self-pay | Admitting: Pain Medicine

## 2013-08-14 ENCOUNTER — Ambulatory Visit: Payer: Self-pay | Admitting: Pain Medicine

## 2013-08-20 ENCOUNTER — Ambulatory Visit: Payer: Self-pay | Admitting: Pain Medicine

## 2013-09-13 ENCOUNTER — Ambulatory Visit: Payer: Self-pay | Admitting: Pain Medicine

## 2013-09-19 ENCOUNTER — Ambulatory Visit: Payer: Self-pay | Admitting: Pain Medicine

## 2013-10-11 ENCOUNTER — Ambulatory Visit: Payer: Self-pay | Admitting: Pain Medicine

## 2013-11-13 ENCOUNTER — Ambulatory Visit: Payer: Self-pay | Admitting: Pain Medicine

## 2013-12-11 ENCOUNTER — Ambulatory Visit: Payer: Self-pay | Admitting: Pain Medicine

## 2013-12-26 ENCOUNTER — Ambulatory Visit: Payer: Self-pay | Admitting: Pain Medicine

## 2014-01-16 ENCOUNTER — Ambulatory Visit: Payer: Self-pay | Admitting: Pain Medicine

## 2014-02-12 ENCOUNTER — Ambulatory Visit: Payer: Self-pay | Admitting: Pain Medicine

## 2014-02-20 ENCOUNTER — Ambulatory Visit: Payer: Self-pay | Admitting: Pain Medicine

## 2014-03-14 ENCOUNTER — Ambulatory Visit: Payer: Self-pay | Admitting: Pain Medicine

## 2014-03-18 ENCOUNTER — Ambulatory Visit: Payer: Self-pay | Admitting: Pain Medicine

## 2014-04-16 ENCOUNTER — Ambulatory Visit: Payer: Self-pay | Admitting: Pain Medicine

## 2014-06-13 ENCOUNTER — Ambulatory Visit: Payer: Self-pay | Admitting: Pain Medicine

## 2014-07-01 ENCOUNTER — Ambulatory Visit: Payer: Self-pay | Admitting: Pain Medicine

## 2014-08-08 ENCOUNTER — Ambulatory Visit: Payer: Self-pay | Admitting: Pain Medicine

## 2014-08-28 ENCOUNTER — Ambulatory Visit: Payer: Self-pay | Admitting: Pain Medicine

## 2014-08-31 ENCOUNTER — Ambulatory Visit: Payer: Self-pay | Admitting: Pain Medicine

## 2014-10-01 ENCOUNTER — Ambulatory Visit: Payer: Self-pay | Admitting: Pain Medicine

## 2014-10-10 ENCOUNTER — Ambulatory Visit
Admit: 2014-10-10 | Disposition: A | Discharge status: Home / Self Care | Payer: Self-pay | Attending: Family Medicine | Admitting: Family Medicine

## 2014-10-23 ENCOUNTER — Ambulatory Visit
Admit: 2014-10-23 | Disposition: A | Discharge status: Home / Self Care | Payer: Self-pay | Attending: Family Medicine | Admitting: Family Medicine

## 2014-10-24 LAB — HM PAP SMEAR: HM Pap smear: NEGATIVE

## 2014-10-31 ENCOUNTER — Ambulatory Visit
Admit: 2014-10-31 | Disposition: A | Discharge status: Home / Self Care | Payer: Self-pay | Attending: Pain Medicine | Admitting: Pain Medicine

## 2014-11-24 ENCOUNTER — Other Ambulatory Visit: Payer: Self-pay | Admitting: Pain Medicine

## 2014-11-24 DIAGNOSIS — M7061 Trochanteric bursitis, right hip: Secondary | ICD-10-CM | POA: Insufficient documentation

## 2014-11-24 DIAGNOSIS — M5030 Other cervical disc degeneration, unspecified cervical region: Secondary | ICD-10-CM

## 2014-11-24 DIAGNOSIS — Z981 Arthrodesis status: Secondary | ICD-10-CM

## 2014-11-24 DIAGNOSIS — M7062 Trochanteric bursitis, left hip: Secondary | ICD-10-CM

## 2014-11-24 DIAGNOSIS — M47817 Spondylosis without myelopathy or radiculopathy, lumbosacral region: Secondary | ICD-10-CM

## 2014-11-24 DIAGNOSIS — M47812 Spondylosis without myelopathy or radiculopathy, cervical region: Secondary | ICD-10-CM

## 2014-11-24 DIAGNOSIS — M533 Sacrococcygeal disorders, not elsewhere classified: Secondary | ICD-10-CM | POA: Insufficient documentation

## 2014-11-25 ENCOUNTER — Ambulatory Visit: Payer: Managed Care, Other (non HMO) | Attending: Pain Medicine | Admitting: Pain Medicine

## 2014-11-25 ENCOUNTER — Encounter (INDEPENDENT_AMBULATORY_CARE_PROVIDER_SITE_OTHER): Payer: Self-pay

## 2014-11-25 ENCOUNTER — Encounter: Payer: Self-pay | Admitting: Pain Medicine

## 2014-11-25 VITALS — BP 107/58 | HR 56 | Temp 97.8°F | Resp 16 | Ht 70.0 in | Wt 180.0 lb

## 2014-11-25 DIAGNOSIS — M7061 Trochanteric bursitis, right hip: Secondary | ICD-10-CM

## 2014-11-25 DIAGNOSIS — M545 Low back pain: Secondary | ICD-10-CM | POA: Diagnosis present

## 2014-11-25 DIAGNOSIS — M5030 Other cervical disc degeneration, unspecified cervical region: Secondary | ICD-10-CM

## 2014-11-25 DIAGNOSIS — M79605 Pain in left leg: Secondary | ICD-10-CM | POA: Diagnosis present

## 2014-11-25 DIAGNOSIS — M533 Sacrococcygeal disorders, not elsewhere classified: Secondary | ICD-10-CM

## 2014-11-25 DIAGNOSIS — M47816 Spondylosis without myelopathy or radiculopathy, lumbar region: Secondary | ICD-10-CM | POA: Insufficient documentation

## 2014-11-25 DIAGNOSIS — M47812 Spondylosis without myelopathy or radiculopathy, cervical region: Secondary | ICD-10-CM

## 2014-11-25 DIAGNOSIS — M7062 Trochanteric bursitis, left hip: Secondary | ICD-10-CM

## 2014-11-25 DIAGNOSIS — M1288 Other specific arthropathies, not elsewhere classified, other specified site: Secondary | ICD-10-CM | POA: Insufficient documentation

## 2014-11-25 DIAGNOSIS — M5126 Other intervertebral disc displacement, lumbar region: Secondary | ICD-10-CM | POA: Diagnosis not present

## 2014-11-25 DIAGNOSIS — M79604 Pain in right leg: Secondary | ICD-10-CM | POA: Diagnosis present

## 2014-11-25 DIAGNOSIS — M48061 Spinal stenosis, lumbar region without neurogenic claudication: Secondary | ICD-10-CM | POA: Insufficient documentation

## 2014-11-25 MED ORDER — LIDOCAINE HCL (PF) 1 % IJ SOLN
INTRAMUSCULAR | Status: AC
  Administered 2014-11-25: 13:00:00
  Filled 2014-11-25: qty 5

## 2014-11-25 MED ORDER — DOXYCYCLINE HYCLATE 100 MG PO TABS
100.0000 mg | ORAL_TABLET | Freq: Two times a day (BID) | ORAL | Status: DC
Start: 2014-11-25 — End: 2015-02-26

## 2014-11-25 MED ORDER — MIDAZOLAM HCL 5 MG/5ML IJ SOLN
INTRAMUSCULAR | Status: AC
  Administered 2014-11-25: 4 mg via INTRAVENOUS
  Filled 2014-11-25: qty 5

## 2014-11-25 MED ORDER — TRIAMCINOLONE ACETONIDE 40 MG/ML IJ SUSP
INTRAMUSCULAR | Status: AC
  Administered 2014-11-25: 13:00:00
  Filled 2014-11-25: qty 1

## 2014-11-25 MED ORDER — ORPHENADRINE CITRATE 30 MG/ML IJ SOLN
INTRAMUSCULAR | Status: AC
  Filled 2014-11-25: qty 2

## 2014-11-25 MED ORDER — SODIUM CHLORIDE 0.9 % IJ SOLN
INTRAMUSCULAR | Status: AC
  Administered 2014-11-25: 13:00:00
  Filled 2014-11-25: qty 20

## 2014-11-25 MED ORDER — BUPIVACAINE HCL (PF) 0.25 % IJ SOLN
INTRAMUSCULAR | Status: AC
  Administered 2014-11-25: 13:00:00
  Filled 2014-11-25: qty 30

## 2014-11-25 MED ORDER — CEFAZOLIN SODIUM 1 G IJ SOLR
INTRAMUSCULAR | Status: AC
  Administered 2014-11-25: 13:00:00
  Filled 2014-11-25: qty 10

## 2014-11-25 MED ORDER — DOXYCYCLINE HYCLATE 100 MG PO TABS: 100.0000 mg | ORAL_TABLET | Freq: Two times a day (BID) | ORAL | Status: DC

## 2014-11-25 MED ORDER — FENTANYL CITRATE (PF) 100 MCG/2ML IJ SOLN
INTRAMUSCULAR | Status: AC
  Administered 2014-11-25: 100 ug via INTRAVENOUS
  Filled 2014-11-25: qty 2

## 2014-11-25 MED ORDER — METHADONE HCL 5 MG PO TABS: ORAL_TABLET | ORAL | Status: DC

## 2014-11-25 NOTE — Progress Notes (Signed)
PROCEDURE PERFORMED: Lumbar epidural steroid injection   NOTE: The patient is a 54 y.o. female who returns to [place] for further evaluation and treatment of pain involving the lumbar and lower extremity region. MRI revealed the patient to be with generative changes lumbar spine L3-4 L4-5 with annular disc bulging L4-5 and far lateral disc component in close proximity to the left extraforaminal L3 nerve root and L5-S1 broad-based disc bulging and bilateral facet arthropathy. The risks, benefits, and expectations of the procedure have been discussed and explained to the patient who was understanding and in agreement with suggested treatment plan. We will proceed with interventional treatment as discussed and explained to the patient who is willing to proceed with procedure as planned.   DESCRIPTION OF PROCEDURE: Lumbar epidural steroid injection with IV Versed, IV fentanyl conscious sedation, EKG, blood pressure, pulse, and pulse oximetry monitoring. The procedure was performed with the patient in the prone position under fluoroscopic guidance. A local anesthetic skin wheal of 1.5% plain lidocaine was accomplished at proposed entry site. An 18-gauge Tuohy epidural needle was inserted at the L 4 vertebral body level left of the midline via loss-of-resistance technique with negative heme and negative CSF return. A total of 40 mL of Preservative-Free normal saline with 40 mg of Kenalog injected incrementally via epidurally placed needle. Needle removed. The patient tolerated the injection well.   PLAN:   1. Medications: We will continue presently prescribed medications. 2. Will consider modification of treatment regimen pending response to treatment rendered on today's visit and follow-up evaluation. 3. The patient is to follow-up with primary care physician regarding blood pressure and general medical condition status post lumbar epidural steroid injection performed on today's  visit. 4. Surgical evaluation. 5. Neurological evaluation. 6. The patient may be a candidate for radiofrequency procedures, implantation device, and other treatment pending response to treatment and follow-up evaluation. 7. The patient has been advised to adhere to proper body mechanics and avoid activities which appear to aggravate condition. 8. The patient has been advised to call the Pain Management Center prior to scheduled return appointment should there be significant change in condition or should there be significant  1. Medications: We will continue presently prescribed medications.  2. Will consider modification of treatment regimen pending response to treatment rendered on today's visit and follow-up evaluation.  3. The patient is to follow-up with primary care physician regarding blood pressure and general medical condition status post lumbar epidural steroid injection performed on today's visit.  4. Surgical evaluation.  5. Neurological evaluation. 6. The patient may be a candidate for radiofrequency procedures, implantation device, and other treatment pending response to treatment and follow-up evaluation.  7. The patient has been advised to adhere to proper body mechanics and avoid activities which appear to aggravate condition.  8. The patient has been advised to call the Pain Management Center prior to scheduled return appointment should there be significant change in condition or should should patient have other concerns regarding condition prior to scheduled return appointment.  The patient is understanding and in agreement with suggested treatment plan.

## 2014-11-25 NOTE — Progress Notes (Signed)
   Subjective:    Patient ID: Jamie Kim, female    DOB: 29-Apr-1961, 54 y.o.   MRN: [id number]  HPI    Review of Systems     Objective:   Physical Exam        Assessment & Plan:

## 2014-11-25 NOTE — Patient Instructions (Addendum)
Continue present medications and antibioticcs  F/U PCP for evaliation of  BP and general medical  condition.  F/U surgical evaluation.  F/U nrurological evaluation.  May consider radiofrequency rhizolysis or intraspinal procedures pending response to present treatment and F/U evaluation.  Patient to call Pain Management Center should patient have concerns prior to scheduled return appointment.  GENERAL RISKS AND COMPLICATIONS  What are the risk, side effects and possible complications? Generally speaking, most procedures are safe.  However, with any procedure there are risks, side effects, and the possibility of complications.  The risks and complications are dependent upon the sites that are lesioned, or the type of nerve block to be performed.  The closer the procedure is to the spine, the more serious the risks are.  Great care is taken when placing the radio frequency needles, block needles or lesioning probes, but sometimes complications can occur. 1. Infection: Any time there is an injection through the skin, there is a risk of infection.  This is why sterile conditions are used for these blocks.  There are four possible types of infection. 1. Localized skin infection. 2. Central Nervous System Infection-This can be in the form of Meningitis, which can be deadly. 3. Epidural Infections-This can be in the form of an epidural abscess, which can cause pressure inside of the spine, causing compression of the spinal cord with subsequent paralysis. This would require an emergency surgery to decompress, and there are no guarantees that the patient would recover from the paralysis. 4. Discitis-This is an infection of the intervertebral discs.  It occurs in about 1% of discography procedures.  It is difficult to treat and it may lead to surgery.        2. Pain: the needles have to go through skin and soft tissues, will cause soreness.       3. Damage to internal structures:  The nerves to be  lesioned may be near blood vessels or    other nerves which can be potentially damaged.       4. Bleeding: Bleeding is more common if the patient is taking blood thinners such as  aspirin, Coumadin, Ticiid, Plavix, etc., or if he/she have some genetic predisposition  such as hemophilia. Bleeding into the spinal canal can cause compression of the spinal  cord with subsequent paralysis.  This would require an emergency surgery to  decompress and there are no guarantees that the patient would recover from the  paralysis.       5. Pneumothorax:  Puncturing of a lung is a possibility, every time a needle is introduced in  the area of the chest or upper back.  Pneumothorax refers to free air around the  collapsed lung(s), inside of the thoracic cavity (chest cavity).  Another two possible  complications related to a similar event would include: Hemothorax and Chylothorax.   These are variations of the Pneumothorax, where instead of air around the collapsed  lung(s), you may have blood or chyle, respectively.       6. Spinal headaches: They may occur with any procedures in the area of the spine.       7. Persistent CSF (Cerebro-Spinal Fluid) leakage: This is a rare problem, but may occur  with prolonged intrathecal or epidural catheters either due to the formation of a fistulous  track or a dural tear.       8. Nerve damage: By working so close to the spinal cord, there is always a possibility of  nerve damage,  which could be as serious as a permanent spinal cord injury with  paralysis.       9. Death:  Although rare, severe deadly allergic reactions known as "Anaphylactic  reaction" can occur to any of the medications used.      10. Worsening of the symptoms:  We can always make thing worse.  What are the chances of something like this happening? Chances of any of this occuring are extremely low.  By statistics, you have more of a chance of getting killed in a motor vehicle accident: while driving to the  hospital than any of the above occurring .  Nevertheless, you should be aware that they are possibilities.  In general, it is similar to taking a shower.  Everybody knows that you can slip, hit your head and get killed.  Does that mean that you should not shower again?  Nevertheless always keep in mind that statistics do not mean anything if you happen to be on the wrong side of them.  Even if a procedure has a 1 (one) in a 1,000,000 (million) chance of going wrong, it you happen to be that one..Also, keep in mind that by statistics, you have more of a chance of having something go wrong when taking medications.  Who should not have this procedure? If you are on a blood thinning medication (e.g. Coumadin, Plavix, see list of "Blood Thinners"), or if you have an active infection going on, you should not have the procedure.  If you are taking any blood thinners, please inform your physician.  How should I prepare for this procedure?  Do not eat or drink anything at least six hours prior to the procedure.  Bring a driver with you .  It cannot be a taxi.  Come accompanied by an adult that can drive you back, and that is strong enough to help you if your legs get weak or numb from the local anesthetic.  Take all of your medicines the morning of the procedure with just enough water to swallow them.  If you have diabetes, make sure that you are scheduled to have your procedure done first thing in the morning, whenever possible.  If you have diabetes, take only half of your insulin dose and notify our nurse that you have done so as soon as you arrive at the clinic.  If you are diabetic, but only take blood sugar pills (oral hypoglycemic), then do not take them on the morning of your procedure.  You may take them after you have had the procedure.  Do not take aspirin or any aspirin-containing medications, at least eleven (11) days prior to the procedure.  They may prolong bleeding.  Wear loose fitting  clothing that may be easy to take off and that you would not mind if it got stained with Betadine or blood.  Do not wear any jewelry or perfume  Remove any nail coloring.  It will interfere with some of our monitoring equipment.  NOTE: Remember that this is not meant to be interpreted as a complete list of all possible complications.  Unforeseen problems may occur.  BLOOD THINNERS The following drugs contain aspirin or other products, which can cause increased bleeding during surgery and should not be taken for 2 weeks prior to and 1 week after surgery.  If you should need take something for relief of minor pain, you may take acetaminophen which is found in Tylenol,m Datril, Anacin-3 and Panadol. It is not blood thinner. The  products listed below are.  Do not take any of the products listed below in addition to any listed on your instruction sheet.  A.P.C or A.P.C with Codeine Codeine Phosphate Capsules #3 Ibuprofen Ridaura  ABC compound Congesprin Imuran rimadil  Advil Cope Indocin Robaxisal  Alka-Seltzer Effervescent Pain Reliever and Antacid Coricidin or Coricidin-D  Indomethacin Rufen  Alka-Seltzer plus Cold Medicine Cosprin Ketoprofen S-A-C Tablets  Anacin Analgesic Tablets or Capsules Coumadin Korlgesic Salflex  Anacin Extra Strength Analgesic tablets or capsules CP-2 Tablets Lanoril Salicylate  Anaprox Cuprimine Capsules Levenox Salocol  Anexsia-D Dalteparin Magan Salsalate  Anodynos Darvon compound Magnesium Salicylate Sine-off  Ansaid Dasin Capsules Magsal Sodium Salicylate  Anturane Depen Capsules Marnal Soma  APF Arthritis pain formula Dewitt's Pills Measurin Stanback  Argesic Dia-Gesic Meclofenamic Sulfinpyrazone  Arthritis Bayer Timed Release Aspirin Diclofenac Meclomen Sulindac  Arthritis pain formula Anacin Dicumarol Medipren Supac  Analgesic (Safety coated) Arthralgen Diffunasal Mefanamic Suprofen  Arthritis Strength Bufferin Dihydrocodeine Mepro Compound Suprol   Arthropan liquid Dopirydamole Methcarbomol with Aspirin Synalgos  ASA tablets/Enseals Disalcid Micrainin Tagament  Ascriptin Doan's Midol Talwin  Ascriptin A/D Dolene Mobidin Tanderil  Ascriptin Extra Strength Dolobid Moblgesic Ticlid  Ascriptin with Codeine Doloprin or Doloprin with Codeine Momentum Tolectin  Asperbuf Duoprin Mono-gesic Trendar  Aspergum Duradyne Motrin or Motrin IB Triminicin  Aspirin plain, buffered or enteric coated Durasal Myochrisine Trigesic  Aspirin Suppositories Easprin Nalfon Trillsate  Aspirin with Codeine Ecotrin Regular or Extra Strength Naprosyn Uracel  Atromid-S Efficin Naproxen Ursinus  Auranofin Capsules Elmiron Neocylate Vanquish  Axotal Emagrin Norgesic Verin  Azathioprine Empirin or Empirin with Codeine Normiflo Vitamin E  Azolid Emprazil Nuprin Voltaren  Bayer Aspirin plain, buffered or children's or timed BC Tablets or powders Encaprin Orgaran Warfarin Sodium  Buff-a-Comp Enoxaparin Orudis Zorpin  Buff-a-Comp with Codeine Equegesic Os-Cal-Gesic   Buffaprin Excedrin plain, buffered or Extra Strength Oxalid   Bufferin Arthritis Strength Feldene Oxphenbutazone   Bufferin plain or Extra Strength Feldene Capsules Oxycodone with Aspirin   Bufferin with Codeine Fenoprofen Fenoprofen Pabalate or Pabalate-SF   Buffets II Flogesic Panagesic   Buffinol plain or Extra Strength Florinal or Florinal with Codeine Panwarfarin   Buf-Tabs Flurbiprofen Penicillamine   Butalbital Compound Four-way cold tablets Penicillin   Butazolidin Fragmin Pepto-Bismol   Carbenicillin Geminisyn Percodan   Carna Arthritis Reliever Geopen Persantine   Carprofen Gold's salt Persistin   Chloramphenicol Goody's Phenylbutazone   Chloromycetin Haltrain Piroxlcam   Clmetidine heparin Plaquenil   Cllnoril Hyco-pap Ponstel   Clofibrate Hydroxy chloroquine Propoxyphen         Before stopping any of these medications, be sure to consult the physician who ordered them.  Some, such as  Coumadin (Warfarin) are ordered to prevent or treat serious conditions such as "deep thrombosis", "pumonary embolisms", and other heart problems.  The amount of time that you may need off of the medication may also vary with the medication and the reason for which you were taking it.  If you are taking any of these medications, please make sure you notify your pain physician before you undergo any procedures.         Pain Management Discharge Instructions  General Discharge Instructions :  If you need to reach your doctor call: Monday-Friday 8:00 am - 4:00 pm at [phone] or toll free [phone].  After clinic hours [phone] to have operator reach doctor.  Bring all of your medication bottles to all your appointments in the pain clinic.  To cancel or reschedule your appointment with Pain  Management please remember to call 24 hours in advance to avoid a fee.  Refer to the educational materials which you have been given on: General Risks, I had my Procedure. Discharge Instructions, Post Sedation.  Post Procedure Instructions:  The drugs you were given will stay in your system until tomorrow, so for the next 24 hours you should not drive, make any legal decisions or drink any alcoholic beverages.  You may eat anything you prefer, but it is better to start with liquids then soups and crackers, and gradually work up to solid foods.  Please notify your doctor immediately if you have any unusual bleeding, trouble breathing or pain that is not related to your normal pain.  Depending on the type of procedure that was done, some parts of your body may feel week and/or numb.  This usually clears up by tonight or the next day.  Walk with the use of an assistive device or accompanied by an adult for the 24 hours.  You may use ice on the affected area for the first 24 hours.  Put ice in a Ziploc bag and cover with a towel and place against area 15 minutes on 15 minutes off.  You may  switch to heat after 24 hours.  A prescription for DOXYCYCLINE and METHADONE was given to you today.

## 2014-11-25 NOTE — Progress Notes (Signed)
Discharged via w/c at 1:45 pm. Tolerating po fluids well. Instructions (verbal and written) given to patient. Teachback x3.

## 2014-11-26 ENCOUNTER — Telehealth: Payer: Self-pay | Admitting: *Deleted

## 2014-11-26 NOTE — Telephone Encounter (Signed)
Patient denies any problems/concerns post procedure. 

## 2014-12-05 ENCOUNTER — Encounter: Payer: Managed Care, Other (non HMO) | Admitting: Pain Medicine

## 2014-12-26 ENCOUNTER — Encounter: Payer: Managed Care, Other (non HMO) | Admitting: Pain Medicine

## 2015-01-07 ENCOUNTER — Encounter: Payer: Self-pay | Admitting: Pain Medicine

## 2015-01-07 ENCOUNTER — Ambulatory Visit: Payer: Managed Care, Other (non HMO) | Attending: Pain Medicine | Admitting: Pain Medicine

## 2015-01-07 VITALS — BP 117/84 | HR 59 | Temp 97.8°F | Resp 18 | Ht 71.0 in | Wt 170.0 lb

## 2015-01-07 DIAGNOSIS — M5481 Occipital neuralgia: Secondary | ICD-10-CM | POA: Insufficient documentation

## 2015-01-07 DIAGNOSIS — M7060 Trochanteric bursitis, unspecified hip: Secondary | ICD-10-CM | POA: Diagnosis not present

## 2015-01-07 DIAGNOSIS — M47817 Spondylosis without myelopathy or radiculopathy, lumbosacral region: Secondary | ICD-10-CM

## 2015-01-07 DIAGNOSIS — M545 Low back pain: Secondary | ICD-10-CM | POA: Diagnosis present

## 2015-01-07 DIAGNOSIS — M47812 Spondylosis without myelopathy or radiculopathy, cervical region: Secondary | ICD-10-CM | POA: Diagnosis not present

## 2015-01-07 DIAGNOSIS — M48061 Spinal stenosis, lumbar region without neurogenic claudication: Secondary | ICD-10-CM

## 2015-01-07 DIAGNOSIS — M461 Sacroiliitis, not elsewhere classified: Secondary | ICD-10-CM | POA: Insufficient documentation

## 2015-01-07 DIAGNOSIS — M5030 Other cervical disc degeneration, unspecified cervical region: Secondary | ICD-10-CM | POA: Diagnosis not present

## 2015-01-07 DIAGNOSIS — M5136 Other intervertebral disc degeneration, lumbar region: Secondary | ICD-10-CM | POA: Diagnosis not present

## 2015-01-07 DIAGNOSIS — M79604 Pain in right leg: Secondary | ICD-10-CM | POA: Diagnosis present

## 2015-01-07 DIAGNOSIS — M47816 Spondylosis without myelopathy or radiculopathy, lumbar region: Secondary | ICD-10-CM | POA: Diagnosis not present

## 2015-01-07 DIAGNOSIS — M7062 Trochanteric bursitis, left hip: Secondary | ICD-10-CM

## 2015-01-07 DIAGNOSIS — M79605 Pain in left leg: Secondary | ICD-10-CM | POA: Diagnosis present

## 2015-01-07 DIAGNOSIS — M7061 Trochanteric bursitis, right hip: Secondary | ICD-10-CM

## 2015-01-07 DIAGNOSIS — Z981 Arthrodesis status: Secondary | ICD-10-CM | POA: Diagnosis not present

## 2015-01-07 DIAGNOSIS — M533 Sacrococcygeal disorders, not elsewhere classified: Secondary | ICD-10-CM | POA: Diagnosis not present

## 2015-01-07 MED ORDER — DULOXETINE HCL 60 MG PO CPEP: ORAL_CAPSULE | ORAL | Status: DC

## 2015-01-07 MED ORDER — METHADONE HCL 5 MG PO TABS: ORAL_TABLET | ORAL | Status: DC

## 2015-01-07 MED ORDER — GABAPENTIN 600 MG PO TABS: ORAL_TABLET | ORAL | Status: DC

## 2015-01-07 MED ORDER — TIZANIDINE HCL 2 MG PO TABS: ORAL_TABLET | ORAL | Status: DC

## 2015-01-07 NOTE — Progress Notes (Signed)
Safety precautions to be maintained throughout the outpatient stay will include: orient to surroundings, keep bed in low position, maintain call bell within reach at all times, provide assistance with transfer out of bed and ambulation.  

## 2015-01-07 NOTE — Patient Instructions (Addendum)
Continue present medications Neurontin and Cymbalta Zanaflex and methadone  Block of nerves to the sacroiliac joint to be performed Monday, 01/20/2015  F/U PCP for evaliation of  BP and general medical  condition.  F/U surgical evaluation  F/U neurological evaluation  May consider radiofrequency rhizolysis or intraspinal procedures pending response to present treatment and F/U evaluation.  Patient to call Pain Management Center should patient have concerns prior to scheduled return appointment.  Scripts for Tizanidine, Gabapentin, and  Cymbalta were sent to your pharmacy. You were given a script for Methadone today.Sacroiliac (SI) Joint Injection Patient Information  Description: The sacroiliac joint connects the scrum (very low back and tailbone) to the ilium (a pelvic bone which also forms half of the hip joint).  Normally this joint experiences very little motion.  When this joint becomes inflamed or unstable low back and or hip and pelvis pain may result.  Injection of this joint with local anesthetics (numbing medicines) and steroids can provide diagnostic information and reduce pain.  This injection is performed with the aid of x-ray guidance into the tailbone area while you are lying on your stomach.   You may experience an electrical sensation down the leg while this is being done.  You may also experience numbness.  We also may ask if we are reproducing your normal pain during the injection.  Conditions which may be treated SI injection:   Low back, buttock, hip or leg pain  Preparation for the Injection:  1. Do not eat any solid food or dairy products within 6 hours of your appointment.  2. You may drink clear liquids up to 2 hours before appointment.  Clear liquids include water, black coffee, juice or soda.  No milk or cream please. 3. You may take your regular medications, including pain medications with a sip of water before your appointment.  Diabetics should hold regular  insulin (if take separately) and take 1/2 normal NPH dose the morning of the procedure.  Carry some sugar containing items with you to your appointment. 4. A driver must accompany you and be prepared to drive you home after your procedure. 5. Bring all of your current medications with you. 6. An IV may be inserted and sedation may be given at the discretion of the physician. 7. A blood pressure cuff, EKG and other monitors will often be applied during the procedure.  Some patients may need to have extra oxygen administered for a short period.  8. You will be asked to provide medical information, including your allergies, prior to the procedure.  We must know immediately if you are taking blood thinners (like Coumadin/Warfarin) or if you are allergic to IV iodine contrast (dye).  We must know if you could possible be pregnant.  Possible side effects:   Bleeding from needle site  Infection (rare, may require surgery)  Nerve injury (rare)  Numbness & tingling (temporary)  A brief convulsion or seizure  Light-headedness (temporary)  Pain at injection site (several days)  Decreased blood pressure (temporary)  Weakness in the leg (temporary)   Call if you experience:   New onset weakness or numbness of an extremity below the injection site that last more than 8 hours.  Hives or difficulty breathing ( go to the emergency room)  Inflammation or drainage at the injection site  Any new symptoms which are concerning to you  Please note:  Although the local anesthetic injected can often make your back/ hip/ buttock/ leg feel good for several hours  after the injections, the pain will likely return.  It takes 3-7 days for steroids to work in the sacroiliac area.  You may not notice any pain relief for at least that one week.  If effective, we will often do a series of three injections spaced 3-6 weeks apart to maximally decrease your pain.  After the initial series, we generally will  wait some months before a repeat injection of the same type.  If you have any questions, please call [phone] [facility]
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
## 2015-01-07 NOTE — Progress Notes (Signed)
   Subjective:    Patient ID: Jamie Kim, female    DOB: Aug 01, 1960, 54 y.o.   MRN: [id number]  HPI    Review of Systems     Objective:   Physical Exam        Assessment & Plan:

## 2015-01-15 ENCOUNTER — Other Ambulatory Visit: Payer: Self-pay | Admitting: Pain Medicine

## 2015-01-27 ENCOUNTER — Ambulatory Visit: Payer: Managed Care, Other (non HMO) | Attending: Pain Medicine | Admitting: Pain Medicine

## 2015-01-27 VITALS — BP 116/83 | HR 77 | Temp 98.4°F | Resp 16 | Ht 70.0 in | Wt 170.0 lb

## 2015-01-27 DIAGNOSIS — M79604 Pain in right leg: Secondary | ICD-10-CM | POA: Diagnosis present

## 2015-01-27 DIAGNOSIS — M5126 Other intervertebral disc displacement, lumbar region: Secondary | ICD-10-CM | POA: Diagnosis not present

## 2015-01-27 DIAGNOSIS — M47812 Spondylosis without myelopathy or radiculopathy, cervical region: Secondary | ICD-10-CM

## 2015-01-27 DIAGNOSIS — M48061 Spinal stenosis, lumbar region without neurogenic claudication: Secondary | ICD-10-CM

## 2015-01-27 DIAGNOSIS — M545 Low back pain: Secondary | ICD-10-CM | POA: Diagnosis present

## 2015-01-27 DIAGNOSIS — M47817 Spondylosis without myelopathy or radiculopathy, lumbosacral region: Secondary | ICD-10-CM

## 2015-01-27 DIAGNOSIS — M5481 Occipital neuralgia: Secondary | ICD-10-CM

## 2015-01-27 DIAGNOSIS — M47816 Spondylosis without myelopathy or radiculopathy, lumbar region: Secondary | ICD-10-CM | POA: Diagnosis not present

## 2015-01-27 DIAGNOSIS — M533 Sacrococcygeal disorders, not elsewhere classified: Secondary | ICD-10-CM

## 2015-01-27 DIAGNOSIS — M79605 Pain in left leg: Secondary | ICD-10-CM | POA: Diagnosis present

## 2015-01-27 DIAGNOSIS — M7062 Trochanteric bursitis, left hip: Secondary | ICD-10-CM

## 2015-01-27 DIAGNOSIS — M5030 Other cervical disc degeneration, unspecified cervical region: Secondary | ICD-10-CM

## 2015-01-27 DIAGNOSIS — Z981 Arthrodesis status: Secondary | ICD-10-CM

## 2015-01-27 DIAGNOSIS — M7061 Trochanteric bursitis, right hip: Secondary | ICD-10-CM

## 2015-01-27 MED ORDER — MIDAZOLAM HCL 5 MG/5ML IJ SOLN
INTRAMUSCULAR | Status: AC
  Administered 2015-01-27: 3 mg via INTRAVENOUS
  Filled 2015-01-27: qty 5

## 2015-01-27 MED ORDER — BUPIVACAINE HCL (PF) 0.25 % IJ SOLN
INTRAMUSCULAR | Status: AC
  Administered 2015-01-27: 30 mg
  Filled 2015-01-27: qty 30

## 2015-01-27 MED ORDER — ORPHENADRINE CITRATE 30 MG/ML IJ SOLN
INTRAMUSCULAR | Status: AC
  Filled 2015-01-27: qty 2

## 2015-01-27 MED ORDER — TRIAMCINOLONE ACETONIDE 40 MG/ML IJ SUSP
INTRAMUSCULAR | Status: AC
  Administered 2015-01-27: 40 mg
  Filled 2015-01-27: qty 1

## 2015-01-27 MED ORDER — FENTANYL CITRATE (PF) 100 MCG/2ML IJ SOLN
INTRAMUSCULAR | Status: AC
  Administered 2015-01-27: 100 ug via INTRAVENOUS
  Filled 2015-01-27: qty 2

## 2015-01-27 NOTE — Progress Notes (Signed)
Subjective:    Patient ID: Jamie Kim, female    DOB: 1960/11/22, 54 y.o.   MRN: [id number]  HPI PROCEDURE:  Block of nerves to the sacroiliac joint.   NOTE:  The patient is a 54 y.o. female who returns to the [facility] for further evaluation and treatment of pain involving the lower back and lower extremity region with pain in the region of the buttocks as well. Prior MRI studies reveal multilevel degenerative changes lumbar spine with L3-4 right and L4-5 degenerative changes most significant with annular disc bulging L4-L5 level with for left lateral disc component and close proximity to the extraforaminal L5 nerve root and L5-S1 broad-based disc bulge and bilateral facet arthropathy. The patient is with severe tenderness to palpation over the PSIS and PII S regions and the patient has a positive Patrick's maneuver.   There is concern regarding a significant component of the patient's pain being due to sacroiliac joint dysfunction The risks, benefits, expectations of the procedure have been discussed and explained to the patient who is understanding and willing to proceed with interventional treatment in attempt to decrease severity of patient's symptoms, minimize the risk of medication escalation and  hopefully retard the progression of the patient's symptoms. We will proceed with what is felt to be a medically necessary procedure, block of nerves to the sacroiliac joint.   DESCRIPTION OF PROCEDURE:  Block of nerves to the sacroiliac joint.   The patient was taken to the fluoroscopy suite. With the patient in the prone position with EKG, blood pressure, pulse and pulse oximetry monitoring, IV Versed, IV fentanyl conscious sedation, Betadine prep of proposed entry site was performed.   Block of nerves at the L5 vertebral body level.   With the patient in prone position, under fluoroscopic guidance, a 22 -gauge needle was inserted at the L5 vertebral body level on the left  side. With 15 degrees oblique orientation a 22 -gauge needle was inserted in the region known as Burton's eye or eye of the Scotty dog. Following documentation of needle placement in the area of Burton's eye or eye of the Scotty dog under fluoroscopic guidance, needle placement was then accomplished at the sacral ala level on the left side.   Needle placement at the sacral ala.   With the patient in prone position under fluoroscopic guidance with AP view of the lumbosacral spine, a 22 -gauge needle was inserted in the region known as the sacral ala on the left side. Following documentation of needle placement on the left side under fluoroscopic guidance needle placement was then accomplished at the S1 foramen level.   Needle placement at the S1 foramen level.   With the patient in prone position under fluoroscopic guidance with AP view of the lumbosacral spine and cephalad orientation, a 22 -gauge needle was inserted at the superior and lateral border of the S1 foramen on the left side. Following documentation of needle placement at the S1 foramen level on the left side, needle placement was then accomplished at the S2 foramen level on the left side.   Needle placement at the S2 foramen level.   With the patient in prone position with AP view of the lumbosacral spine with cephalad orientation, a 22 - gauge needle was inserted at the superior and lateral border of the S2 foramen under fluoroscopic guidance on the left side. Following needle placement at the L5 vertebral body level, sacral ala, S1 foramen and S2 foramen on the left side,  needle placement was verified on lateral view under fluoroscopic guidance.  Following needle placement documentation on lateral view, each needle was injected with 1 mL of 0.25% bupivacaine and Kenalog.   BLOCK OF THE NERVES TO SACROILIAC JOINT ON THE RIGHT SIDE The procedure was performed on the right side at the same levels as was performed on the left side and  utilizing the same technique as on the left side and was performed under fluoroscopic guidance as on the left side   A total of 10mg  of Kenalog was utilized for the procedure.   PLAN:  1. Medications: The patient will continue presently prescribed medications  Cymbalta Neurontin Zanaflex and methadone 2. The patient will be considered for modification of treatment regimen pending response to the procedure performed on today's visit.  3. The patient is to follow-up with primary care physician Dr. Lisbeth Ply for evaluation of blood pressure and general medical condition following the procedure performed on today's visit.  4. Surgical evaluation as discussed with [facility]   5. Neurological evaluation as discussed.  6. The patient may be a candidate for radiofrequency procedures, implantation devices and other treatment pending response to treatment performed on today's visit and follow-up evaluation.  7. The patient has been advised to adhere to proper body mechanics and to avoid activities which may exacerbate the patient's symptoms.   [facility] as scheduled.       Review of Systems     Objective:   Physical Exam        Assessment & Plan:

## 2015-01-27 NOTE — Patient Instructions (Addendum)
Continue present medications Cymbalta Neurontin Zanaflex and methadone   F/U PCP for evaliation of  BP and general medical  condition.  F/U surgical evaluation  F/U neurological evaluation  May consider radiofrequency rhizolysis or intraspinal procedures pending response to present treatment and F/U evaluation.  Patient to call [facility] should patient have concerns prior to scheduled return appointment. Pain Management Discharge Instructions  General Discharge Instructions :  If you need to reach your doctor call: Monday-Friday 8:00 am - 4:00 pm at [phone] or toll free [phone].  After clinic hours [phone] to have operator reach doctor.  Bring all of your medication bottles to all your appointments in the pain clinic.  To cancel or reschedule your appointment with Pain Management please remember to call 24 hours in advance to avoid a fee.  Refer to the educational materials which you have been given on: General Risks, I had my Procedure. Discharge Instructions, Post Sedation.  Post Procedure Instructions:  The drugs you were given will stay in your system until tomorrow, so for the next 24 hours you should not drive, make any legal decisions or drink any alcoholic beverages.  You may eat anything you prefer, but it is better to start with liquids then soups and crackers, and gradually work up to solid foods.  Please notify your doctor immediately if you have any unusual bleeding, trouble breathing or pain that is not related to your normal pain.  Depending on the type of procedure that was done, some parts of your body may feel week and/or numb.  This usually clears up by tonight or the next day.  Walk with the use of an assistive device or accompanied by an adult for the 24 hours.  You may use ice on the affected area for the first 24 hours.  Put ice in a Ziploc bag and cover with a towel and place against area 15 minutes on 15 minutes off.  You may  switch to heat after 24 hours.Sacroiliac (SI) Joint Injection Patient Information  Description: The sacroiliac joint connects the scrum (very low back and tailbone) to the ilium (a pelvic bone which also forms half of the hip joint).  Normally this joint experiences very little motion.  When this joint becomes inflamed or unstable low back and or hip and pelvis pain may result.  Injection of this joint with local anesthetics (numbing medicines) and steroids can provide diagnostic information and reduce pain.  This injection is performed with the aid of x-ray guidance into the tailbone area while you are lying on your stomach.   You may experience an electrical sensation down the leg while this is being done.  You may also experience numbness.  We also may ask if we are reproducing your normal pain during the injection.  Conditions which may be treated SI injection:   Low back, buttock, hip or leg pain  Preparation for the Injection:  1. Do not eat any solid food or dairy products within 6 hours of your appointment.  2. You may drink clear liquids up to 2 hours before appointment.  Clear liquids include water, black coffee, juice or soda.  No milk or cream please. 3. You may take your regular medications, including pain medications with a sip of water before your appointment.  Diabetics should hold regular insulin (if take separately) and take 1/2 normal NPH dose the morning of the procedure.  Carry some sugar containing items with you to your appointment. 4. A driver must accompany  you and be prepared to drive you home after your procedure. 5. Bring all of your current medications with you. 6. An IV may be inserted and sedation may be given at the discretion of the physician. 7. A blood pressure cuff, EKG and other monitors will often be applied during the procedure.  Some patients may need to have extra oxygen administered for a short period.  8. You will be asked to provide medical information,  including your allergies, prior to the procedure.  We must know immediately if you are taking blood thinners (like Coumadin/Warfarin) or if you are allergic to IV iodine contrast (dye).  We must know if you could possible be pregnant.  Possible side effects:   Bleeding from needle site  Infection (rare, may require surgery)  Nerve injury (rare)  Numbness & tingling (temporary)  A brief convulsion or seizure  Light-headedness (temporary)  Pain at injection site (several days)  Decreased blood pressure (temporary)  Weakness in the leg (temporary)   Call if you experience:   New onset weakness or numbness of an extremity below the injection site that last more than 8 hours.  Hives or difficulty breathing ( go to the emergency room)  Inflammation or drainage at the injection site  Any new symptoms which are concerning to you  Please note:  Although the local anesthetic injected can often make your back/ hip/ buttock/ leg feel good for several hours after the injections, the pain will likely return.  It takes 3-7 days for steroids to work in the sacroiliac area.  You may not notice any pain relief for at least that one week.  If effective, we will often do a series of three injections spaced 3-6 weeks apart to maximally decrease your pain.  After the initial series, we generally will wait some months before a repeat injection of the same type.  If you have any questions, please call [phone] [facility]

## 2015-01-27 NOTE — Progress Notes (Signed)
Safety precautions to be maintained throughout the outpatient stay will include: orient to surroundings, keep bed in low position, maintain call bell within reach at all times, provide assistance with transfer out of bed and ambulation.  

## 2015-01-28 ENCOUNTER — Telehealth: Payer: Self-pay | Admitting: *Deleted

## 2015-01-28 NOTE — Telephone Encounter (Signed)
LVM

## 2015-02-05 ENCOUNTER — Other Ambulatory Visit: Payer: Self-pay | Admitting: Pain Medicine

## 2015-02-05 DIAGNOSIS — M48061 Spinal stenosis, lumbar region without neurogenic claudication: Secondary | ICD-10-CM

## 2015-02-05 DIAGNOSIS — M7062 Trochanteric bursitis, left hip: Secondary | ICD-10-CM

## 2015-02-05 DIAGNOSIS — M7061 Trochanteric bursitis, right hip: Secondary | ICD-10-CM

## 2015-02-05 DIAGNOSIS — M533 Sacrococcygeal disorders, not elsewhere classified: Secondary | ICD-10-CM

## 2015-02-05 DIAGNOSIS — M47817 Spondylosis without myelopathy or radiculopathy, lumbosacral region: Secondary | ICD-10-CM

## 2015-02-06 ENCOUNTER — Encounter: Payer: Self-pay | Admitting: Pain Medicine

## 2015-02-06 ENCOUNTER — Ambulatory Visit: Payer: Managed Care, Other (non HMO) | Attending: Pain Medicine | Admitting: Pain Medicine

## 2015-02-06 VITALS — BP 127/89 | HR 70 | Temp 97.9°F | Resp 16 | Ht 70.0 in | Wt 170.0 lb

## 2015-02-06 DIAGNOSIS — M461 Sacroiliitis, not elsewhere classified: Secondary | ICD-10-CM | POA: Insufficient documentation

## 2015-02-06 DIAGNOSIS — M7061 Trochanteric bursitis, right hip: Secondary | ICD-10-CM

## 2015-02-06 DIAGNOSIS — M1288 Other specific arthropathies, not elsewhere classified, other specified site: Secondary | ICD-10-CM | POA: Insufficient documentation

## 2015-02-06 DIAGNOSIS — M542 Cervicalgia: Secondary | ICD-10-CM | POA: Diagnosis present

## 2015-02-06 DIAGNOSIS — M47812 Spondylosis without myelopathy or radiculopathy, cervical region: Secondary | ICD-10-CM | POA: Diagnosis not present

## 2015-02-06 DIAGNOSIS — M5136 Other intervertebral disc degeneration, lumbar region: Secondary | ICD-10-CM | POA: Diagnosis not present

## 2015-02-06 DIAGNOSIS — M533 Sacrococcygeal disorders, not elsewhere classified: Secondary | ICD-10-CM | POA: Insufficient documentation

## 2015-02-06 DIAGNOSIS — M47816 Spondylosis without myelopathy or radiculopathy, lumbar region: Secondary | ICD-10-CM | POA: Insufficient documentation

## 2015-02-06 DIAGNOSIS — M7062 Trochanteric bursitis, left hip: Secondary | ICD-10-CM

## 2015-02-06 DIAGNOSIS — M5030 Other cervical disc degeneration, unspecified cervical region: Secondary | ICD-10-CM

## 2015-02-06 DIAGNOSIS — M47817 Spondylosis without myelopathy or radiculopathy, lumbosacral region: Secondary | ICD-10-CM

## 2015-02-06 DIAGNOSIS — R51 Headache: Secondary | ICD-10-CM | POA: Diagnosis present

## 2015-02-06 DIAGNOSIS — M5481 Occipital neuralgia: Secondary | ICD-10-CM | POA: Diagnosis not present

## 2015-02-06 DIAGNOSIS — M5126 Other intervertebral disc displacement, lumbar region: Secondary | ICD-10-CM | POA: Diagnosis not present

## 2015-02-06 DIAGNOSIS — M48061 Spinal stenosis, lumbar region without neurogenic claudication: Secondary | ICD-10-CM

## 2015-02-06 DIAGNOSIS — M7060 Trochanteric bursitis, unspecified hip: Secondary | ICD-10-CM | POA: Diagnosis not present

## 2015-02-06 DIAGNOSIS — Z981 Arthrodesis status: Secondary | ICD-10-CM

## 2015-02-06 DIAGNOSIS — M546 Pain in thoracic spine: Secondary | ICD-10-CM | POA: Diagnosis present

## 2015-02-06 MED ORDER — METHADONE HCL 5 MG PO TABS: ORAL_TABLET | ORAL | Status: DC

## 2015-02-06 NOTE — Patient Instructions (Signed)
Continue present medication Neurontin Zanaflex Cymbalta and methadone  F/U PCP Dr. Lisbeth Ply for evaliation of  BP and general medical  condition  F/U surgical evaluation  F/U neurological evaluation  May consider radiofrequency rhizolysis or intraspinal procedures pending response to present treatment and F/U evaluation   Patient to call [facility] should patient have concerns prior to scheduled return appointmen.

## 2015-02-06 NOTE — Progress Notes (Signed)
Safety precautions to be maintained throughout the outpatient stay will include: orient to surroundings, keep bed in low position, maintain call bell within reach at all times, provide assistance with transfer out of bed and ambulation.  

## 2015-02-06 NOTE — Progress Notes (Signed)
   Subjective:    Patient ID: Jamie Kim, female    DOB: 1960/08/22, 54 y.o.   MRN: [id number]  HPI  Patient is a 54 year old female returns to pain [facility] for further evaluation and treatment of pain involving the neck headaches and entire back upper extremity regions. Patient is quite pleased following previous procedure performed in [facility] states that she has minimal lower back lower extremity pain and was without headache is well we will avoid interventional treatment at this time and continue patient's present medications. The patient was understanding and in agreement status treatment plan.    Review of Systems     Objective:   Physical Exam  There was minimal tenderness over the region of the splenius capitis and occipitalis musculature regions. Palpation over the thoracic facet thoracic paraspinal musculature region cervical facet cervical paraspinal must region reproduced minimal discomfort. Patient appeared to be with unremarkable Spurling's maneuver with bilaterally equal grip strength. Tinel and Phalen's maneuver without increased pain of significant degree. Reevaluation was requested decreased grip strength on the left compared to the right. Palpation of the thoracic facet thoracic paraspinal muscles region was a tends to palpation of mild degree. Palpation over the lumbar paraspinal muscles lumbar facet region was with moderate tends to palpation. Lateral bending rotation and extension to palpation lumbar facets reproduce moderate discomfort. There was moderate tends to palpation over the PSIS and PII S region on the left and mild tenderness on the right. Mild tenderness of the greater trochanteric region iliotibial band region. Straight leg raising tolerated to 30 without increased pain dorsiflexion noted. Negative clonus negative Homans. Abdomen nontender with no costovertebral angle tenderness noted.      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 Geoffry Paradise  for evaliation of  BP and general medical  condition  F/U surgical evaluation. Patient will follow-up with Dr. Chanetta Marshall neurosurgery as needed   F/U neurological evaluation  May consider radiofrequency rhizolysis or intraspinal procedures pending response to present treatment and F/U evaluation   Patient to call [facility] should patient have concerns prior to scheduled return appointmen.

## 2015-02-26 ENCOUNTER — Ambulatory Visit: Payer: Managed Care, Other (non HMO) | Attending: Pain Medicine | Admitting: Pain Medicine

## 2015-02-26 ENCOUNTER — Encounter: Payer: Self-pay | Admitting: Pain Medicine

## 2015-02-26 VITALS — BP 148/86 | HR 63 | Temp 98.0°F | Resp 16 | Ht 70.0 in | Wt 170.0 lb

## 2015-02-26 DIAGNOSIS — M7061 Trochanteric bursitis, right hip: Secondary | ICD-10-CM

## 2015-02-26 DIAGNOSIS — M5481 Occipital neuralgia: Secondary | ICD-10-CM

## 2015-02-26 DIAGNOSIS — M47816 Spondylosis without myelopathy or radiculopathy, lumbar region: Secondary | ICD-10-CM | POA: Diagnosis not present

## 2015-02-26 DIAGNOSIS — Z981 Arthrodesis status: Secondary | ICD-10-CM

## 2015-02-26 DIAGNOSIS — M545 Low back pain: Secondary | ICD-10-CM | POA: Diagnosis present

## 2015-02-26 DIAGNOSIS — M79604 Pain in right leg: Secondary | ICD-10-CM | POA: Diagnosis present

## 2015-02-26 DIAGNOSIS — M47817 Spondylosis without myelopathy or radiculopathy, lumbosacral region: Secondary | ICD-10-CM

## 2015-02-26 DIAGNOSIS — M5030 Other cervical disc degeneration, unspecified cervical region: Secondary | ICD-10-CM

## 2015-02-26 DIAGNOSIS — M51369 Other intervertebral disc degeneration, lumbar region without mention of lumbar back pain or lower extremity pain: Secondary | ICD-10-CM

## 2015-02-26 DIAGNOSIS — M79605 Pain in left leg: Secondary | ICD-10-CM | POA: Diagnosis present

## 2015-02-26 DIAGNOSIS — M47812 Spondylosis without myelopathy or radiculopathy, cervical region: Secondary | ICD-10-CM

## 2015-02-26 DIAGNOSIS — M5136 Other intervertebral disc degeneration, lumbar region: Secondary | ICD-10-CM | POA: Insufficient documentation

## 2015-02-26 DIAGNOSIS — M5126 Other intervertebral disc displacement, lumbar region: Secondary | ICD-10-CM | POA: Diagnosis not present

## 2015-02-26 DIAGNOSIS — M533 Sacrococcygeal disorders, not elsewhere classified: Secondary | ICD-10-CM

## 2015-02-26 DIAGNOSIS — M7062 Trochanteric bursitis, left hip: Secondary | ICD-10-CM

## 2015-02-26 DIAGNOSIS — M48061 Spinal stenosis, lumbar region without neurogenic claudication: Secondary | ICD-10-CM

## 2015-02-26 MED ORDER — CIPROFLOXACIN IN D5W 400 MG/200ML IV SOLN
INTRAVENOUS | Status: AC
  Administered 2015-02-26: 400 mg via INTRAVENOUS
  Filled 2015-02-26: qty 200

## 2015-02-26 MED ORDER — MIDAZOLAM HCL 5 MG/5ML IJ SOLN
INTRAMUSCULAR | Status: AC
Start: 2015-02-26 — End: 2015-02-26
  Administered 2015-02-26: 4 mg via INTRAVENOUS
  Filled 2015-02-26: qty 5

## 2015-02-26 MED ORDER — FENTANYL CITRATE (PF) 100 MCG/2ML IJ SOLN
INTRAMUSCULAR | Status: AC
  Administered 2015-02-26: 100 ug via INTRAVENOUS
  Filled 2015-02-26: qty 2

## 2015-02-26 MED ORDER — METHADONE HCL 5 MG PO TABS: ORAL_TABLET | ORAL | Status: DC

## 2015-02-26 MED ORDER — LIDOCAINE HCL (PF) 1 % IJ SOLN
INTRAMUSCULAR | Status: AC
  Administered 2015-02-26: 13:00:00
  Filled 2015-02-26: qty 10

## 2015-02-26 MED ORDER — CIPROFLOXACIN HCL 250 MG PO TABS: 250.0000 mg | ORAL_TABLET | Freq: Two times a day (BID) | ORAL | Status: DC

## 2015-02-26 NOTE — Patient Instructions (Addendum)
Continue present medications Cymbalta  Neurontin Zanaflex and methadone. Please get Cipro antibiotic today and begin taking Cipro today as discussed  F/U PCP Dr. Geoffry Paradise  for evaliation of  BP and general medical  condition  F/U surgical evaluation Dr.Haglund as needed   F/U neurological evaluation  May consider radiofrequency rhizolysis or intraspinal procedures pending response to present treatment and F/U evaluation   Patient to call [facility] should patient have concerns prior to scheduled return appointmen. Pain Management Discharge Instructions  General Discharge Instructions :  If you need to reach your doctor call: Monday-Friday 8:00 am - 4:00 pm at [phone] or toll free [phone].  After clinic hours [phone] to have operator reach doctor.  Bring all of your medication bottles to all your appointments in the pain clinic.  To cancel or reschedule your appointment with Pain Management please remember to call 24 hours in advance to avoid a fee.  Refer to the educational materials which you have been given on: General Risks, I had my Procedure. Discharge Instructions, Post Sedation.  Post Procedure Instructions:  The drugs you were given will stay in your system until tomorrow, so for the next 24 hours you should not drive, make any legal decisions or drink any alcoholic beverages.  You may eat anything you prefer, but it is better to start with liquids then soups and crackers, and gradually work up to solid foods.  Please notify your doctor immediately if you have any unusual bleeding, trouble breathing or pain that is not related to your normal pain.  Depending on the type of procedure that was done, some parts of your body may feel week and/or numb.  This usually clears up by tonight or the next day.  Walk with the use of an assistive device or accompanied by an adult for the 24 hours.  You may use ice on the affected area for the first 24 hours.   Put ice in a Ziploc bag and cover with a towel and place against area 15 minutes on 15 minutes off.  You may switch to heat after 24 hours.Radiofrequency Lesioning Radiofrequency lesioning is a procedure to relieve pain. The procedure is often used for back, neck, or arm pain. Radiofrequency lesioning uses a specialized machine that creates radio waves to make heat. The heat damages the nerve that carries the pain signal. Pain relief usually lasts 6 months to 1 year.  LET YOUR CAREGIVER KNOW ABOUT:  Allergies to food or medicine.  Medicines taken, including vitamins, herbs, eyedrops, over-the-counter medicines, and creams.  Use of steroids (by mouth or creams).  Previous problems with anesthetics or numbing medicines.  History of bleeding problems or blood clots.  Previous surgery.  Other health problems, including diabetes and kidney problems.  Possibility of pregnancy, if this applies.  Breathing problems and smoking history.  Any recent colds or infections. RISKS AND COMPLICATIONS This procedure is generally safe. The risks and complications depend on what treatment site is used. General complications may include:  Pain or soreness at the injection site.  Infection at the injection site.  Damage to nerves or blood vessels. BEFORE THE PROCEDURE  Ask your caregiver about changing or stopping any medicines you are on before the procedure.  If you take blood thinners, ask if you should stop taking them before the procedure.  You may be asked to wash with an antibiotic soap before the procedure.  Do not eat or drink for 8 hours before your procedure or as  told by your caregiver.  Ask your caregiver what time you need to arrive for your procedure.  This is an outpatient procedure. This means you will be able to go home the same day. Make plans for someone to drive you home. PROCEDURE  You will be awake during the procedure. You need to be able to talk to the surgeon during  the procedure. However, you might be given medicine to help you relax (sedative).  Medicine to numb the area (local anesthetic) will be injected.  With the help of a type of X-ray (fluoroscopy), a radio frequency needle will be inserted into the area to be treated. Then, a wire that carries the radio waves (electrode) will be put through the radio frequency needle. An electrical pulse will be sent through the electrode to verify the correct nerve.  You will feel a tingling sensation similar to hitting your "funny bone." You may also have muscle twitching. The tissue around the needle tip is then heated when electric current is passed using the radio frequency machine. This numbs the nerves.  A bandage (dressing) will be put on the area after the procedure is done. AFTER THE PROCEDURE  You will stay in a recovery area until you are awake enough to eat and drink.  Once everything is back to normal, you will be able to go home.  You will need to arrange for someone to drive you home if you received a sedative or pain relieving medicine during the procedure. Document Released: 02/17/2011 Document Revised: 09/13/2011 Document Reviewed: 02/17/2011 [facility] Patient Information 2015 [place], [place]. This information is not intended to replace advice given to you by your health care provider. Make sure you discuss any questions you have with your health care provider.

## 2015-02-26 NOTE — Progress Notes (Signed)
Safety precautions to be maintained throughout the outpatient stay will include: orient to surroundings, keep bed in low position, maintain call bell within reach at all times, provide assistance with transfer out of bed and ambulation.  

## 2015-02-26 NOTE — Progress Notes (Signed)
Subjective:    Patient ID: Jamie Kim, female    DOB: 06-18-1961, 54 y.o.   MRN: [id number]  HPI  PROCEDURE PERFORMED: Radiofrequency rhizolysis (lunbar facet medial branch nerve radiofrequency rhizolysis).  The procedure was performed with the COOLIEF technique.  COOLIEF technique was performed using the Synergy cooled radiofrequency probe SIP-17-150-4, the synergy cooled radiofrequency introducer SII-17-150, and the synergy cooled sterile tube kit [id number].   HISTORY: The patient is a 54 y.o. female who returns to the [facility] for further evaluation of severely disabling pain involving the lumbar and lower extremity region.  MRI revealed degenerative disc disease lumbar spine with L3-4 and L4-5 degenerative changes with pain no disc bulging L4-L5 level with for left lateral disc component includes proximity to extraforaminal L5 nerve root and L5-S1 broad-based disc bulge with bilateral facet arthropathy there is concern regarding significant component of patient's pain being due to facet arthropathy with facet syndrome. Tinel's had significant relief of pain with prior lumbar facet medial branch nerve, blocks. This is his been made to proceed with radiofrequency rhizolysis lumbar facet, medial branch nerves, to provide longer lasting relief of patient's pain, minimize progression of patient's symptoms, and avoid the need for more involved treatment. The risks, benefits and expectations of the procedure have been discussed and explained to the patient who was with understanding and wished to proceed with interventional treatment in an attempt to decrease severely  disabling pain of the lumbar and lower extremity region.  We will proceed with what is felt to be a medically necessary procedure, radiofrequency rhizolysis (lumber facet medial branch nerve radiofrequency rhizolysis) using the COOLIEF technique.  DESCRIPTION OF PROCEDURE: COOLIEF Technique Radiofrequency Rhizolysis of  Lumbar Facets (Medial Branch Nerves Radiofrequency Rhizolysis) with IV Versed, IV Fentanyl conscious sedation, EKG, blood pressure, pulse, and pulse oximetry monitoring.  The procedure was performed with the patient in the prone position under fluoroscopic guidance.  Following Betadine prep of the proposed entry site, a 1.5% lidocaine plain skin wheal of the proposed needle entry site was  prepared in oblique orientation.   Left, L3 Radiofrequency Rhizolysis L3 Facet  (Medial Branch Nerve): Under fluoroscopic guidance, the radiofrequency needle was inserted at the L3 vertebral body level after a local anesthetic skin wheal of 1.5% lidocaine plain and Betadine prep of the proposed needle entry site was performed.  The needle was inserted under fluoroscopic guidance in the area known as Burton's eye or eye of the Scotty dog with needle placed at the superior and lateral border of the targeted area with oblique orientation utilizing the tunnel (gun  barrel approach) technique to the targeted area.  Left Radiofrequency Rhizolysis at L4 and L5 (Medial Branch Nerves) The procedure was performed at L4 and L5 vertebral body levels exactly as was performed at the L3 vertebral body level and under fluoroscopic guidance utilizing the identical technique as utilized at the L3 vertebral body level.  Needle was then verified on lateral view and following needle placement verification on lateral view, radiofrequency testing was then carried out with motor testing at 2 Hz stimulation without evidence of stimulation of the lower extremity. Following radiofrequency testing for motor testing, each radiofrequency probe was then prepared with 2 mL of preservative-free lidocaine and following 1 minute of anesthetizing each proposed radiofrequency lesioning  Level, the radiofrequency probe was then inserted in the left, L3 vertebral body level needle with radiofrequency lesioning carried out for 2-1/2 minutes with temperature of  the tissue being maintained at 80 degrees  centigrade for 2-1/2 minutes.  Radiofrequency probe was then inserted in the left, L4 vertebral body level needle and radiofrequency lesioning was performed at 80 degrees centigrade tissue temperature for 2-1/2 minutes.  Radiofrequency probe was then inserted in the left  L5 vertebral body level needle and radiofrequency lesioning was performed at 80 degrees centigrade tissue temperature for 2-1/2 minutes  The patient tolerated the procedure well.   PLAN: 1. Medications: The patient will continue the present medications. 2. The patient will follow up with Dr. Geoffry Paradise regarding blood pressure and general medical condition as discussed with the patient.  Will schedule the patient an appointment to follow up with Dr. Lisbeth Ply this week. 3. Surgical evaluation as discussed. 4. Neurological evaluation as discussed.  5. The patient has been advised to call the [facility] prior to scheduled return appointment should there be significant change in the patient's condition or should the patient have other concerns regarding condition prior to scheduled return appointment.  The patient is with understanding and is in agreement with suggested treatment plan.    Review of Systems     Objective:   Physical Exam        Assessment & Plan:

## 2015-02-27 ENCOUNTER — Telehealth: Payer: Self-pay

## 2015-02-27 NOTE — Telephone Encounter (Signed)
Left message

## 2015-03-13 ENCOUNTER — Ambulatory Visit: Payer: Managed Care, Other (non HMO) | Attending: Pain Medicine | Admitting: Pain Medicine

## 2015-03-13 VITALS — BP 152/99 | HR 78 | Temp 98.8°F | Resp 19 | Ht 70.0 in | Wt 170.0 lb

## 2015-03-13 DIAGNOSIS — M7022 Olecranon bursitis, left elbow: Secondary | ICD-10-CM

## 2015-03-13 DIAGNOSIS — M7061 Trochanteric bursitis, right hip: Secondary | ICD-10-CM

## 2015-03-13 DIAGNOSIS — M5136 Other intervertebral disc degeneration, lumbar region: Secondary | ICD-10-CM | POA: Insufficient documentation

## 2015-03-13 DIAGNOSIS — R51 Headache: Secondary | ICD-10-CM | POA: Diagnosis present

## 2015-03-13 DIAGNOSIS — M47812 Spondylosis without myelopathy or radiculopathy, cervical region: Secondary | ICD-10-CM | POA: Diagnosis not present

## 2015-03-13 DIAGNOSIS — M7060 Trochanteric bursitis, unspecified hip: Secondary | ICD-10-CM | POA: Insufficient documentation

## 2015-03-13 DIAGNOSIS — M5030 Other cervical disc degeneration, unspecified cervical region: Secondary | ICD-10-CM

## 2015-03-13 DIAGNOSIS — M7062 Trochanteric bursitis, left hip: Secondary | ICD-10-CM

## 2015-03-13 DIAGNOSIS — M5481 Occipital neuralgia: Secondary | ICD-10-CM

## 2015-03-13 DIAGNOSIS — M542 Cervicalgia: Secondary | ICD-10-CM | POA: Diagnosis present

## 2015-03-13 DIAGNOSIS — Z981 Arthrodesis status: Secondary | ICD-10-CM

## 2015-03-13 DIAGNOSIS — M48061 Spinal stenosis, lumbar region without neurogenic claudication: Secondary | ICD-10-CM

## 2015-03-13 DIAGNOSIS — M51369 Other intervertebral disc degeneration, lumbar region without mention of lumbar back pain or lower extremity pain: Secondary | ICD-10-CM

## 2015-03-13 DIAGNOSIS — M461 Sacroiliitis, not elsewhere classified: Secondary | ICD-10-CM | POA: Insufficient documentation

## 2015-03-13 DIAGNOSIS — M7030 Other bursitis of elbow, unspecified elbow: Secondary | ICD-10-CM | POA: Insufficient documentation

## 2015-03-13 DIAGNOSIS — M47816 Spondylosis without myelopathy or radiculopathy, lumbar region: Secondary | ICD-10-CM | POA: Insufficient documentation

## 2015-03-13 DIAGNOSIS — M533 Sacrococcygeal disorders, not elsewhere classified: Secondary | ICD-10-CM

## 2015-03-13 DIAGNOSIS — M47817 Spondylosis without myelopathy or radiculopathy, lumbosacral region: Secondary | ICD-10-CM

## 2015-03-13 MED ORDER — METHADONE HCL 5 MG PO TABS: ORAL_TABLET | ORAL | Status: DC

## 2015-03-13 NOTE — Progress Notes (Signed)
Subjective:    Patient ID: Jamie Kim, female    DOB: 10-24-1960, 54 y.o.   MRN: [id number]  HPI  Patient is a 54 year old female returns to pain management for further evaluation and treatment of pain involving the neck headaches upper back lower back lower extremity regions. Patient has significant improvement of pain on the left side of the lumbar region following radiofrequency rhizolysis lumbar facet medial branch nerves. We will proceed with radiofrequency rhizolysis lumbar facet medial branch nerves on the right side pending insurance approval. At the present time patient is with pain involving the region of the elbow. We will proceed with further evaluation of the elbow and  we will schedule orthopedic evaluation of the elbow at this time. There is concern regarding inflammation of the bursa and other abnormalities May BE contributing to the swelling and pain of the elbow. We will continue medications as prescribed. The patient was in agreement status treatment plan.    Review of Systems     Objective:   Physical Exam  There was tenderness over the splenius capitis and occipitalis region of mild degree. No masses of the head and neck were noted. There was tenderness of the acromioclavicular and glenohumeral joint region of mild degree. Patient appeared to be with unremarkable Spurling's maneuver. Patient was with slightly decreased grip strength. Tinel and Phalen's maneuver were without increase of pain significant degree. There was tenderness to palpation of the elbow with slightly increased warmth no erythema was noted. With slight if tends to palpation posterior aspect of the elbow near the olecranon with swelling noted as well. There was tends to palpation of the thoracic facet thoracic paraspinal muscles region of mild to moderate degree. There was mild to moderate tenderness over the lumbar facet lumbar paraspinal muscles. Palpation of the lumbar paraspinal muscles region was  with increased tends to palpation on the right compared to the left. There was tenderness over the PSIS PSIS region as well as the gluteal muscles gluteal and piriformis musculature region on the right greater than the left. Patient was with significant decrease tends to palpation on the left lumbar paraspinal muscle lumbar facet region compatible the right lumbar facet lumbar paraspinal muscles region. Palpation of the PSIS and PII S region with tenderness to palpation of mild degree on the left and moderate degree on the right. Straight leg raising tolerate phosphate 30 without increased warmth erythema There was negative clonus negative Homans The abdomen was nontender and no costovertebral maintenance noted     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 changes with pain no disc bulging L4-L5 level with for left lateral disc component includes proximity to extraforaminal L5 nerve root and L5-S1 broad-based disc bulge with bilateral facet arthropathy  Degenerative changes of the cervical spine C5-C6 C6-7 and C7 1 degenerative changes with prior cervical discectomy and fusion  Sacroiliac joint dysfunction, sacroiliitis  Lumbar facet syndrome  Trochanteric bursitis  Bursitis of the elbow (evaluate for other abnormalities of elbow)  Bilateral occipital neuralgia    PLAN   Continue present medication Neurontin Cymbalta Zanaflex and methadone  Radiofrequency rhizolysis lumbar facet, medial branch nerves to be performed on the right side pending insurance approval  F/U PCP Dr. Lisbeth Ply for evaliation of  BP and general medical  condition  F/U surgical evaluation of elbow as planned  F/U neurological evaluation. May consider pending follow-up evaluations  May consider radiofrequency rhizolysis or intraspinal procedures pending response to  present treatment and F/U evaluation   Patient to call [facility] should patient have  concerns prior to scheduled return appointment.

## 2015-03-13 NOTE — Progress Notes (Signed)
   Subjective:    Patient ID: Jamie Kim, female    DOB: 1960/08/12, 54 y.o.   MRN: [id number]  HPI    Review of Systems     Objective:   Physical Exam        Assessment & Plan:    Plan  TENS unit prescribed for patient with pain involving the cervical, thoracic, and lumbar regions and upper and lower extremity regions with degenerative disc disease cervical, thoracic, and lumbar regions and with myofascial pain as well

## 2015-03-13 NOTE — Patient Instructions (Addendum)
PLAN   Continue present medication Neurontin Zanaflex Cymbalta and methadone  Radiofrequency rhizolysis lumbar facet, medial branch nerves, to be performed once treatment of elbow is resolved  F/U PCP Dr. Lisbeth Ply for evaliation of swollen elbow with concern regarding inflamed bursa and other causes, BP, and general medical  condition  F/U surgical evaluation. Ask Caryl Pina the date of your orthopedic evaluation of elbow. If you cannot be seen this week go to the emergency room for evaluation. There is concern regarding inflammation of bursa and other causes  F/U neurological evaluation. May consider pending follow-up evaluations  You have prescription for TENS unit please ask nurses and Caryl Pina where it you would like to obtain a TENS unit. You may go to physical therapy or to a medical supply store  May consider radiofrequency rhizolysis or intraspinal procedures pending response to present treatment and F/U evaluation   Patient to call [facility] should patient have concerns prior to scheduled return appointment.

## 2015-04-09 ENCOUNTER — Ambulatory Visit: Payer: Managed Care, Other (non HMO) | Attending: Pain Medicine | Admitting: Pain Medicine

## 2015-04-09 ENCOUNTER — Encounter: Payer: Self-pay | Admitting: Pain Medicine

## 2015-04-09 VITALS — BP 114/68 | HR 72 | Temp 97.8°F | Resp 16 | Wt 175.0 lb

## 2015-04-09 DIAGNOSIS — M7062 Trochanteric bursitis, left hip: Secondary | ICD-10-CM

## 2015-04-09 DIAGNOSIS — M47812 Spondylosis without myelopathy or radiculopathy, cervical region: Secondary | ICD-10-CM | POA: Diagnosis not present

## 2015-04-09 DIAGNOSIS — M542 Cervicalgia: Secondary | ICD-10-CM | POA: Diagnosis present

## 2015-04-09 DIAGNOSIS — M5136 Other intervertebral disc degeneration, lumbar region: Secondary | ICD-10-CM | POA: Insufficient documentation

## 2015-04-09 DIAGNOSIS — G629 Polyneuropathy, unspecified: Secondary | ICD-10-CM | POA: Insufficient documentation

## 2015-04-09 DIAGNOSIS — M7030 Other bursitis of elbow, unspecified elbow: Secondary | ICD-10-CM | POA: Insufficient documentation

## 2015-04-09 DIAGNOSIS — M461 Sacroiliitis, not elsewhere classified: Secondary | ICD-10-CM | POA: Insufficient documentation

## 2015-04-09 DIAGNOSIS — M5030 Other cervical disc degeneration, unspecified cervical region: Secondary | ICD-10-CM

## 2015-04-09 DIAGNOSIS — M7061 Trochanteric bursitis, right hip: Secondary | ICD-10-CM

## 2015-04-09 DIAGNOSIS — M7060 Trochanteric bursitis, unspecified hip: Secondary | ICD-10-CM | POA: Insufficient documentation

## 2015-04-09 DIAGNOSIS — M48061 Spinal stenosis, lumbar region without neurogenic claudication: Secondary | ICD-10-CM

## 2015-04-09 DIAGNOSIS — M47816 Spondylosis without myelopathy or radiculopathy, lumbar region: Secondary | ICD-10-CM | POA: Diagnosis not present

## 2015-04-09 DIAGNOSIS — G609 Hereditary and idiopathic neuropathy, unspecified: Secondary | ICD-10-CM

## 2015-04-09 DIAGNOSIS — M5481 Occipital neuralgia: Secondary | ICD-10-CM

## 2015-04-09 DIAGNOSIS — M7022 Olecranon bursitis, left elbow: Secondary | ICD-10-CM

## 2015-04-09 DIAGNOSIS — M533 Sacrococcygeal disorders, not elsewhere classified: Secondary | ICD-10-CM | POA: Diagnosis not present

## 2015-04-09 DIAGNOSIS — M47817 Spondylosis without myelopathy or radiculopathy, lumbosacral region: Secondary | ICD-10-CM

## 2015-04-09 DIAGNOSIS — M545 Low back pain: Secondary | ICD-10-CM | POA: Diagnosis present

## 2015-04-09 DIAGNOSIS — Z981 Arthrodesis status: Secondary | ICD-10-CM

## 2015-04-09 MED ORDER — METHADONE HCL 5 MG PO TABS: ORAL_TABLET | ORAL | Status: DC

## 2015-04-09 MED ORDER — DULOXETINE HCL 60 MG PO CPEP: ORAL_CAPSULE | ORAL | Status: DC

## 2015-04-09 MED ORDER — GABAPENTIN 600 MG PO TABS: ORAL_TABLET | ORAL | Status: DC

## 2015-04-09 MED ORDER — METANX 3-35-2 MG PO TABS: ORAL_TABLET | ORAL | Status: DC

## 2015-04-09 MED ORDER — TIZANIDINE HCL 2 MG PO TABS: ORAL_TABLET | ORAL | Status: DC

## 2015-04-09 NOTE — Progress Notes (Signed)
Safety precautions to be maintained throughout the outpatient stay will include: orient to surroundings, keep bed in low position, maintain call bell within reach at all times, provide assistance with transfer out of bed and ambulation.  

## 2015-04-09 NOTE — Progress Notes (Signed)
Subjective:    Patient ID: Jamie Kim, female    DOB: 09-08-1960, 54 y.o.   MRN: [id number]  HPI Patient is 54 year old female who returns to [facility] for further evaluation and treatment of pain involving the lower back lower extremity regions as well as the neck and upper extremity regions. Patient had significant improvement of her pain of the lower back with the elimination of the shooting pain from the back to the lower extremities following radiofrequency rhizolysis lumbar facet, medial branch nerve radiofrequency rhizolysis. Patient is with greater than 75% relief of pain following the procedure. Patient states that she has burning tingling sensations of the feet and has experienced this sensation for several months. Patient states that the pain is most significant at night and interferes with ability to obtain restful sleep. Patient states that as a sharp burning stinging sensation which is quite severe. We discussed patient's condition and will proceed with lumbar sympathetic block at time return appointment in attempt to decrease severity of symptoms, minimize progression of symptoms, and avoid need for more involved treatment. We will also prescribe attention palpation in attempt to treat patient's symptoms. The patient will follow-up for neurosurgeon Dr. Pamala Hurry as discussed and as needed. The patient was with understanding and in agreement with suggested treatment plan.   Review of Systems     Objective:   Physical Exam There was tends of the splenius capitis and occipitalis musculature region of mild degree with well-healed surgical scar the cervical region without increased warmth and erythema in the region scar patient appeared to be with unremarkable Spurling's maneuver. There was mild tennis of the acromioclavicular and glenohumeral joint region and patient appeared to be with unremarkable Spurling's maneuver. Palpation over the thoracic facet thoracic  paraspinal musculature region was with mild tenderness to tenderness to palpation with no crepitus of the thoracic region noted. Palpation over the lumbar paraspinal muscles lumbar facet region associated with mild discomfort with significant decrease tenderness to palpation of the left compared to the right. Lateral bending and rotation and extension and palpation of the lumbar facets reproduced minimal discomfort on the left compared to the right. There was tenderness of the PSIS and PII S regions. Please note the patient's radiofrequency rhizolysis was performed of the left side which apparently is with evidence of significant decreased pain following the radiofrequency procedure. There was tenderness of the gluteal and piriformis musculature region of mild degree with mild tenderness of the greater trochanteric region iliotibial band region. Straight leg raising tolerates approximately 30 without increased pain with dorsiflexion noted. There appeared to be negative clonus negative Homans. There was no evidence of new lesions in the region of the feet there were areas of increased sensitivity to touch of the feet abdomen was nontender with no costovertebral angle tenderness       Assessment & Plan:   Neuropathy of extremities  Degenerative disc disease lumbar spine L3-4 and L4-5 degenerative changes with pain no disc bulging L4-L5 level with for left lateral disc component includes proximity to extraforaminal L5 nerve root and L5-S1 broad-based disc bulge with bilateral facet arthropathy  Degenerative changes of the cervical spine C5-C6 C6-7 and C7 1 degenerative changes with prior cervical discectomy and fusion  Sacroiliac joint dysfunction, sacroiliitis  Lumbar facet syndrome  Trochanteric bursitis  Bursitis of the elbow (evaluate for other abnormalities of elbow)   PLAN   Continue present medication Neurontin Zanaflex Cymbalta methadone  Lumbar synthetic block to be performed at  time  of return appointment  F/U PCP Dr. Lisbeth Ply for evaliation of  BP and general medical  condition  F/U surgical evaluation. May consider pending follow-up evaluations  F/U neurological evaluation. May consider pending follow-up evaluations  TENS unit prescribe the patient for treatment of pain involving multiple regions with pain due to degenerative disc disease myofascial pain arthritis neuropathy and other conditions  May consider radiofrequency rhizolysis or intraspinal procedures pending response to present treatment and F/U evaluation   Patient to call [facility] should patient have concerns prior to scheduled return appointment.

## 2015-04-09 NOTE — Patient Instructions (Addendum)
PLAN   Continue present medication Cymbalta Neurontin Zanaflex and methadone and began Metanx  Lumbar sympathetic block to be performed at time of return appointment  F/U PCP  Dr. Geoffry Paradise for evaliation of  BP and general medical  condition  F/U surgical evaluation. May consider pending follow-up evaluations Patient will follow-up with Dr.Haglund as needed   F/U neurological evaluation. May consider pending follow-up evaluations  May consider radiofrequency rhizolysis or intraspinal procedures pending response to present treatment and F/U evaluation   Patient to call [facility] Center should patient have concerns prior to scheduled return appointment.  Lumbar Sympathetic Block Patient Information  Description: The lumbar plexus is a group of nerves that are part of the sympathetic nervous system.  These nerves supply organs in the pelvis and legs.  Lumbar sympathetic blocks are utilized for the diagnosis and treatment of painful conditions in these areas.   The lumbar plexus is located on both sides of the aorta at approximately the level of the second lumbar vertebral body.  The block will be performed with you lying on your abdomen with a pillow underneath.  Using direct x-ray guidance,   The plexus will be located on both sides of the spine.  Numbing medicine will be used to deaden the skin prior to needle insertion.  In most cases, a small amount of sedation can be give by IV prior to the numbing medicine.  One or two small needles will be placed near the plexus and local anesthetic will be injected.  This may make your leg(s) feel warm.  The Entire block usually lasts about 15-25 minutes.  Conditions which may be treated by lumbar sympathetic block:   Reflex sympathetic dystrophy  Phantom limb pain  Peripheral neuropathy  Peripheral vascular disease ( inadequate blood flow )  Cancer pain of pelvis, leg and kidney  Preparation for the injection:  1. Do note eat any  solid food or diary products within 6 hours of your appointment. 2. You may drink clear liquids up to 2 hours before appointment.  Clear liquids include water, black coffee, juice or soda.  No milk or cream please. 3. You may take your regular medication, including pain medications, with a sip of water before you appointment.  Diabetics should hold regular insulin ( if taken separately ) and take 1/2 NPH dose the morning of the procedure .  Carry some sugar containing items with you to your appointment. 4. A driver must accompany you and be prepared to drive you home after your procedure. 5. Bring all your current medication with you. 6. An IV may be inserted and sedation may be given at the discretion of the physician.  7. A blood pressure cuff, EKG and other monitors will often be applied during the procedure.  Some patients may need to have extra oxygen administered for a short period. 8. You will be asked to provide medical information, including your allergies and medications, prior to the procedure.  We must know immediately if your taking blood thinners (like Coumadin/Warfarin) or if you are allergic to IV iodine contrast (dye).  We must know if you could possibly be pregnant.  Possible side-effects   Bleeding from needle site or deeper  Infection (rare, can require surgery)  Nerve injury (rare)  Numbness & tingling (temporary)  Collapsed lung (rare)  Spinal headache (a headache worse with upright posture)  Light-headedness (temporary)  Pain at injection site (several days)  Decreased blood pressure (temporary)  Weakness in legs (temporary)  Seizure or other drug reaction (rare)  Call if you experience:   Fever/chills associated with headache or increased back/ neck pain  Headache worsened by an upright position  New onset weakness or numbness of an extremity below the injection site  Hives or difficulty breathing ( go to the emergency room)  Inflammation or  drainage at the injections site(s)  New symptoms which are concerning to you  Please note:  If effective, we will often do a series of 2-3 injections spaced 3-6 weeks apart to maximally decrease your pain.  If initial series is effective, you may be a candidate for a more permanent block of the lumbar sympathetic plexus.  If you have any questions please call [phone] [facility]

## 2015-04-23 ENCOUNTER — Ambulatory Visit: Payer: Managed Care, Other (non HMO) | Attending: Pain Medicine | Admitting: Pain Medicine

## 2015-04-23 ENCOUNTER — Encounter: Payer: Self-pay | Admitting: Pain Medicine

## 2015-04-23 VITALS — BP 104/73 | HR 68 | Temp 98.1°F | Resp 16 | Ht 70.0 in | Wt 175.0 lb

## 2015-04-23 DIAGNOSIS — G609 Hereditary and idiopathic neuropathy, unspecified: Secondary | ICD-10-CM

## 2015-04-23 DIAGNOSIS — Z981 Arthrodesis status: Secondary | ICD-10-CM

## 2015-04-23 DIAGNOSIS — M5481 Occipital neuralgia: Secondary | ICD-10-CM

## 2015-04-23 DIAGNOSIS — M47812 Spondylosis without myelopathy or radiculopathy, cervical region: Secondary | ICD-10-CM

## 2015-04-23 DIAGNOSIS — M7022 Olecranon bursitis, left elbow: Secondary | ICD-10-CM

## 2015-04-23 DIAGNOSIS — R200 Anesthesia of skin: Secondary | ICD-10-CM | POA: Insufficient documentation

## 2015-04-23 DIAGNOSIS — M5136 Other intervertebral disc degeneration, lumbar region: Secondary | ICD-10-CM

## 2015-04-23 DIAGNOSIS — M5030 Other cervical disc degeneration, unspecified cervical region: Secondary | ICD-10-CM

## 2015-04-23 DIAGNOSIS — M51369 Other intervertebral disc degeneration, lumbar region without mention of lumbar back pain or lower extremity pain: Secondary | ICD-10-CM

## 2015-04-23 DIAGNOSIS — M47817 Spondylosis without myelopathy or radiculopathy, lumbosacral region: Secondary | ICD-10-CM

## 2015-04-23 DIAGNOSIS — M7061 Trochanteric bursitis, right hip: Secondary | ICD-10-CM

## 2015-04-23 DIAGNOSIS — G90529 Complex regional pain syndrome I of unspecified lower limb: Secondary | ICD-10-CM

## 2015-04-23 DIAGNOSIS — M533 Sacrococcygeal disorders, not elsewhere classified: Secondary | ICD-10-CM

## 2015-04-23 DIAGNOSIS — M7062 Trochanteric bursitis, left hip: Secondary | ICD-10-CM

## 2015-04-23 DIAGNOSIS — M48061 Spinal stenosis, lumbar region without neurogenic claudication: Secondary | ICD-10-CM

## 2015-04-23 DIAGNOSIS — M545 Low back pain: Secondary | ICD-10-CM | POA: Diagnosis present

## 2015-04-23 MED ORDER — CIPROFLOXACIN HCL 250 MG PO TABS: 250.0000 mg | ORAL_TABLET | Freq: Two times a day (BID) | ORAL | Status: DC

## 2015-04-23 MED ORDER — BUPIVACAINE HCL (PF) 0.5 % IJ SOLN
INTRAMUSCULAR | Status: AC
  Administered 2015-04-23: 30 mL
  Filled 2015-04-23: qty 30

## 2015-04-23 MED ORDER — CIPROFLOXACIN IN D5W 400 MG/200ML IV SOLN
INTRAVENOUS | Status: AC
  Administered 2015-04-23: 400 mg via INTRAVENOUS
  Filled 2015-04-23: qty 200

## 2015-04-23 MED ORDER — TRIAMCINOLONE ACETONIDE 40 MG/ML IJ SUSP: 40.0000 mg | Freq: Once | INTRAMUSCULAR | Status: DC

## 2015-04-23 MED ORDER — BUPIVACAINE HCL (PF) 0.5 % IJ SOLN: 30.0000 mL | Freq: Once | INTRAMUSCULAR | Status: DC

## 2015-04-23 MED ORDER — FENTANYL CITRATE (PF) 100 MCG/2ML IJ SOLN
100.0000 ug | Freq: Once | INTRAMUSCULAR | Status: DC
Start: 2015-04-23 — End: 2023-11-22

## 2015-04-23 MED ORDER — LACTATED RINGERS IV SOLN: 1000.0000 mL | INTRAVENOUS | Status: DC

## 2015-04-23 MED ORDER — LIDOCAINE HCL (PF) 1 % IJ SOLN: 10.0000 mL | Freq: Once | INTRAMUSCULAR | Status: DC

## 2015-04-23 MED ORDER — ORPHENADRINE CITRATE 30 MG/ML IJ SOLN
INTRAMUSCULAR | Status: AC
  Filled 2015-04-23: qty 2

## 2015-04-23 MED ORDER — CIPROFLOXACIN IN D5W 400 MG/200ML IV SOLN: 400.0000 mg | Freq: Once | INTRAVENOUS | Status: DC

## 2015-04-23 MED ORDER — MIDAZOLAM HCL 5 MG/5ML IJ SOLN: 5.0000 mg | Freq: Once | INTRAMUSCULAR | Status: DC

## 2015-04-23 MED ORDER — FENTANYL CITRATE (PF) 100 MCG/2ML IJ SOLN
INTRAMUSCULAR | Status: AC
  Administered 2015-04-23: 100 ug via INTRAVENOUS
  Filled 2015-04-23: qty 2

## 2015-04-23 MED ORDER — MIDAZOLAM HCL 5 MG/5ML IJ SOLN
INTRAMUSCULAR | Status: AC
  Administered 2015-04-23: 4 mg via INTRAVENOUS
  Filled 2015-04-23: qty 5

## 2015-04-23 MED ORDER — SODIUM CHLORIDE 0.9 % IJ SOLN: 20.0000 mL | Freq: Once | INTRAMUSCULAR | Status: DC

## 2015-04-23 MED ORDER — TRIAMCINOLONE ACETONIDE 40 MG/ML IJ SUSP
INTRAMUSCULAR | Status: AC
  Filled 2015-04-23: qty 8

## 2015-04-23 MED ORDER — ORPHENADRINE CITRATE 30 MG/ML IJ SOLN: 60.0000 mg | Freq: Once | INTRAMUSCULAR | Status: DC

## 2015-04-23 NOTE — Progress Notes (Signed)
Subjective:    Patient ID: Jamie Kim, female    DOB: 1960/09/24, 54 y.o.   MRN: [id number]  HPI PROCEDURE PERFORMED: Lumbar sympathetic block.  HISTORY OF PRESENT ILLNESS: The patient is 54 y.o. female who returns to [place] for further evaluation and treatment of pain involving the lower extremities. The patient is with history of degenerative disc disease of the lumbar spine and cervical spine with history of burning stinging throbbing sensation of the lower extremities with the sensation of cold temperature of the lower extremities as well . There is concern regarding the patient's pain being due to complex regional pain syndrome and component of neuropathic pain  Prior lumbar MRI revealed degenerative disc disease lumbar spine with L3-4 and L4-5 degenerative changes with pain no disc bulging L4-L5 level with for left lateral disc component includes proximity to extraforaminal L5 nerve root and L5-S1 broad-based disc bulge with bilateral facet arthropathy . There is concern regarding significant component of patient's pain and symptoms due to component of complex regional pain syndrome and neuropathic component of pain The risks, benefits, and expectations of the procedure were discussed and explained to the patient who was understanding and wished to proceed with interventional treatment as planned.   DESCRIPTION OF PROCEDURE: Lumbar sympathetic block with IV Versed, IV fentanyl conscious sedation, EKG, blood pressure, pulse, pulse oximetry monitoring. The procedure was performed with the patient in prone position under fluoroscopic guidance.   NEEDLE PLACEMENT AT L2, left side lumbar sympathetic block: With the patient in prone position and oblique orientation of 20 degrees, Betadine prep and local anesthetic skin wheal of 1.5% lidocaine plain was prepared at the proposed needle entry site. Under fluoroscopic guidance with 20 degrees oblique orientation, the 22 -gauge  needle was inserted at the lateral border of the L2 vertebral body on the left side.   NEEDLE PLACEMENT AT L3, left side lumbar sympathetic block: With the patient in the prone position and oblique orientation of 20 degrees, Betadine prep and local anesthetic skin wheal of 1.5% lidocaine plain was prepared at the proposed needle entry site. Under fluoroscopic guidance with 20 degrees  oblique orientation, the 22 -gauge needle was inserted at the lateral border of the L3 vertebral body on the left side.   NEEDLE PLACEMENT AT L4, left side lumbar sympathetic block: With the patient in prone position and oblique orientation of 20 degrees, Betadine prep and local anesthetic skin wheal of 1.5% lidocaine plain was prepared at the proposed needle entry site. Under fluoroscopic guidance with 20 degrees  oblique orientation, the 22 -gauge needle was inserted at the lateral border of the L4 vertebral body on the left side.    Following needle placement at the L2, L3 and L4 vertebral body levels on the left side, needle placement was then verified on lateral view with tip of the needle documented to be in the anterior third of the vertebral body of L2, L3 and L4 respectively. Following negative aspiration of each needle for heme and CSF, L2 vertebral body level needle was injected with 10 mL of 0.25% bupivacaine. L3 vertebral body level needle was injected with 10 mL of 0.25% bupivacaine. L4 vertebral body level was injected with 10 mL of 0.25% bupivacaine. Needles were removed. Please note temperature readings prior to lumbar sympathetic block were noted to be 89 degrees Fahrenheit and following completion of the lumbar sympathetic block temperature readings of the lower extremity were noted to be 90.1 degrees Fahrenheit. The patient tolerated the  procedure well.  PLAN:   1. Medications: We will continue presently prescribed medications at this time. 2. The patient is to follow-up with primary care physician Dr.  Lisbeth Ply for further evaluation of blood pressure and general medical condition as discussed. 3. Surgical evaluation as discussed. The patient will follow-up with Dr.Haglund as discussed 4. Neurological evaluation as discussed. May consider [facility] EMG studies and other studies 5. The patient may be candidate for radiofrequency procedures, implantation devices, and other treatment pending response to treatment and follow-up evaluation. 6. The patient has been advised to adhere to proper body mechanics. 7. The patient has been advised to call the Pain Management Center prior to scheduled return appointment should there be significant change in condition or have other concerns regarding condition prior to scheduled return appointment.   The patient was understanding and in agreement with suggested treatment plan.    Review of Systems     Objective:   Physical Exam        Assessment & Plan:

## 2015-04-23 NOTE — Progress Notes (Signed)
Safety precautions to be maintained throughout the outpatient stay will include: orient to surroundings, keep bed in low position, maintain call bell within reach at all times, provide assistance with transfer out of bed and ambulation.  

## 2015-04-23 NOTE — Patient Instructions (Addendum)
PLAN  Continue present medication and begin taking antibiotic Cipro as prescribed. Please obtain your antibiotic today and begin taking antibiotic today  F/U PCP Dr.Hamrick  for evaliation of  BP and general medical  condition.  F/U surgical evaluation.F/U  Haglund as needed  F/U neurological evaluation. May consider pending follow-up evaluations  May consider radiofrequency rhizolysis or intraspinal procedures pending response to present treatment and F/U evaluation.  Patient to call Pain Management Center should patient have concerns prior to scheduled return appointment.   Pain Management Discharge Instructions  General Discharge Instructions :  If you need to reach your doctor call: Monday-Friday 8:00 am - 4:00 pm at [phone] or toll free [phone].  After clinic hours [phone] to have operator reach doctor.  Bring all of your medication bottles to all your appointments in the pain clinic.  To cancel or reschedule your appointment with Pain Management please remember to call 24 hours in advance to avoid a fee.  Refer to the educational materials which you have been given on: General Risks, I had my Procedure. Discharge Instructions, Post Sedation.  Post Procedure Instructions:  The drugs you were given will stay in your system until tomorrow, so for the next 24 hours you should not drive, make any legal decisions or drink any alcoholic beverages.  You may eat anything you prefer, but it is better to start with liquids then soups and crackers, and gradually work up to solid foods.  Please notify your doctor immediately if you have any unusual bleeding, trouble breathing or pain that is not related to your normal pain.  Depending on the type of procedure that was done, some parts of your body may feel week and/or numb.  This usually clears up by tonight or the next day.  Walk with the use of an assistive device or accompanied by an adult for the 24 hours.  You may  use ice on the affected area for the first 24 hours.  Put ice in a Ziploc bag and cover with a towel and place against area 15 minutes on 15 minutes off.  You may switch to heat after 24 hours.  A prescription for CIPRO was sent to your pharmacy and should be available for pickup today.

## 2015-04-24 ENCOUNTER — Telehealth: Payer: Self-pay | Admitting: *Deleted

## 2015-04-24 NOTE — Telephone Encounter (Signed)
Message left

## 2015-04-29 ENCOUNTER — Other Ambulatory Visit: Payer: Self-pay | Admitting: Pain Medicine

## 2015-05-08 ENCOUNTER — Ambulatory Visit: Payer: Managed Care, Other (non HMO) | Attending: Pain Medicine | Admitting: Pain Medicine

## 2015-05-08 ENCOUNTER — Encounter: Payer: Self-pay | Admitting: Pain Medicine

## 2015-05-08 VITALS — BP 163/110 | HR 92 | Temp 99.5°F | Resp 18 | Ht 70.0 in | Wt 170.0 lb

## 2015-05-08 DIAGNOSIS — M7061 Trochanteric bursitis, right hip: Secondary | ICD-10-CM

## 2015-05-08 DIAGNOSIS — M533 Sacrococcygeal disorders, not elsewhere classified: Secondary | ICD-10-CM | POA: Insufficient documentation

## 2015-05-08 DIAGNOSIS — M5030 Other cervical disc degeneration, unspecified cervical region: Secondary | ICD-10-CM | POA: Insufficient documentation

## 2015-05-08 DIAGNOSIS — M542 Cervicalgia: Secondary | ICD-10-CM | POA: Diagnosis present

## 2015-05-08 DIAGNOSIS — G629 Polyneuropathy, unspecified: Secondary | ICD-10-CM | POA: Insufficient documentation

## 2015-05-08 DIAGNOSIS — M47816 Spondylosis without myelopathy or radiculopathy, lumbar region: Secondary | ICD-10-CM | POA: Insufficient documentation

## 2015-05-08 DIAGNOSIS — M47817 Spondylosis without myelopathy or radiculopathy, lumbosacral region: Secondary | ICD-10-CM

## 2015-05-08 DIAGNOSIS — M7022 Olecranon bursitis, left elbow: Secondary | ICD-10-CM

## 2015-05-08 DIAGNOSIS — M546 Pain in thoracic spine: Secondary | ICD-10-CM | POA: Diagnosis present

## 2015-05-08 DIAGNOSIS — Z981 Arthrodesis status: Secondary | ICD-10-CM

## 2015-05-08 DIAGNOSIS — M461 Sacroiliitis, not elsewhere classified: Secondary | ICD-10-CM | POA: Insufficient documentation

## 2015-05-08 DIAGNOSIS — M48061 Spinal stenosis, lumbar region without neurogenic claudication: Secondary | ICD-10-CM

## 2015-05-08 DIAGNOSIS — M7062 Trochanteric bursitis, left hip: Secondary | ICD-10-CM

## 2015-05-08 DIAGNOSIS — M47812 Spondylosis without myelopathy or radiculopathy, cervical region: Secondary | ICD-10-CM

## 2015-05-08 DIAGNOSIS — M5136 Other intervertebral disc degeneration, lumbar region: Secondary | ICD-10-CM | POA: Insufficient documentation

## 2015-05-08 DIAGNOSIS — M5481 Occipital neuralgia: Secondary | ICD-10-CM

## 2015-05-08 DIAGNOSIS — G90529 Complex regional pain syndrome I of unspecified lower limb: Secondary | ICD-10-CM

## 2015-05-08 MED ORDER — METHADONE HCL 5 MG PO TABS: ORAL_TABLET | ORAL | Status: DC

## 2015-05-08 MED ORDER — METANX 3-35-2 MG PO TABS: ORAL_TABLET | ORAL | Status: DC

## 2015-05-08 NOTE — Patient Instructions (Addendum)
PLAN   Continue present medication Cymbalta Neurontin Zanaflex and methadone Take vitamin B complex since insurance will not approve Metanx   F/U PCP  Dr. Geoffry Paradise for evaliation of  BP and general medical  condition Your blood pressure is extremely elevated Please see Dr. Lisbeth Ply today or go to emergency room for evaluation and treatment  F/U surgical evaluation. May consider pending follow-up evaluations Patient will follow-up with Dr.Haglund as needed   F/U neurological evaluation. May consider pending follow-up evaluations  May consider radiofrequency rhizolysis or intraspinal procedures pending response to present treatment and F/U evaluation   Patient to call Pain Management Center should patient have concerns prior to scheduled return appointment.

## 2015-05-08 NOTE — Progress Notes (Signed)
Safety precautions to be maintained throughout the outpatient stay will include: orient to surroundings, keep bed in low position, maintain call bell within reach at all times, provide assistance with transfer out of bed and ambulation.  

## 2015-05-08 NOTE — Progress Notes (Signed)
Subjective:    Patient ID: Jamie Kim, female    DOB: [birth year], 54 y.o.   MRN: [id number]  HPI   The patient is a 54 year old female who returns to pain management for further evaluation and treatment of pain involving the region of the neck entire back upper and lower extremity regions. The patient was quite upset on today's visit and her blood pressure was extremely elevated. The patient stated that  Social worker came to her house today and was quite disrupted. Patient stated that the social worker was very harsh and caused her child to cry. The patient stated that the visit by the social worker was quite traumatic. The patient is scheduled to  Take her child to counselor and psychiatrist today since the child was quite traumatized by the visit. We will continue patient's present medications and we have reminded patient to inform Dr. Lisbeth Ply her primary care physician of the severe elevation of her blood pressure on today's visit. We also suggested that the patient may wish to see Dr. Lisbeth Ply today or tomorrow or go to the emergency room for a recheck of her blood pressure and further treatment. The patient was understanding and agreement with suggested treatment plan we will avoid interventional treatment at this time. We will continue  Present medications. The patient was in agreement with the suggested treatment plan  The patient stated that the lumbar sympathetic block decreased the burning tingling sensations and paresthesias of the lower extremity significantly     Review of Systems     Objective:   Physical Exam  There was tenderness of the splenius capitis talus muscles of moderate degree. There is moderate muscle spasms of the cervical facet cervical paraspinal musculature region. There was well-healed cervical scar at the cervical region without increased warmth and erythema in the region of the scar. Patient was decreased grip strength. Tinel and Phalen's maneuver without  increase of pain of significant degree. There was tenderness of the thoracic facet thoracic paraspinal musculature region without crepitus of the thoracic region noted. Palpation of the lumbar paraspinal muscles and lumbar facet region reproduces moderate discomfort. There was tenderness to palpation over the PSIS and PSIS regions as well as the gluteal and piriformis musculature is restricted basically to approximately 20. There is no allodynia of the lower extremities there was no sensory deficit or dermatomal distribution . There was negative clonus negative Homans. Abdomen was nontender and no costovertebral angle tenderness was noted       Assessment & Plan:     Neuropathy of extremities  Degenerative disc disease lumbar spine L3-4 and L4-5 degenerative changes with pain no disc bulging L4-L5 level with for left lateral disc component includes proximity to extraforaminal L5 nerve root and L5-S1 broad-based disc bulge with bilateral facet arthropathy  Degenerative changes of the cervical spine C5-C6 C6-7 and C7 1 degenerative changes with prior cervical discectomy and fusion  Sacroiliac joint dysfunction, sacroiliitis  Lumbar facet syndrome    PLAN    Continue present medication Neurontin Zanaflex Cymbalta methadone  F/U PCP Dr. Lisbeth Ply for evaliation of severely elevated  BP and general medical  condition  F/U surgical evaluation. May consider pending follow-up evaluations  Psych appointment today following social worker  Visit. Patient states that the visit by this social worker was quite traumatic for her child and her  F/U neurological evaluation. May consider pending follow-up evaluations  Use TENS unit  May consider radiofrequency rhizolysis or intraspinal procedures pending response to present treatment  and F/U evaluation

## 2015-06-10 ENCOUNTER — Ambulatory Visit: Payer: Medicare Other | Admitting: Pain Medicine

## 2015-06-25 ENCOUNTER — Encounter: Payer: Self-pay | Admitting: Pain Medicine

## 2015-06-25 ENCOUNTER — Ambulatory Visit: Payer: Managed Care, Other (non HMO) | Attending: Pain Medicine | Admitting: Pain Medicine

## 2015-06-25 VITALS — BP 127/90 | HR 77 | Temp 98.2°F | Resp 18 | Ht 70.0 in | Wt 175.0 lb

## 2015-06-25 DIAGNOSIS — M47812 Spondylosis without myelopathy or radiculopathy, cervical region: Secondary | ICD-10-CM

## 2015-06-25 DIAGNOSIS — M533 Sacrococcygeal disorders, not elsewhere classified: Secondary | ICD-10-CM | POA: Diagnosis not present

## 2015-06-25 DIAGNOSIS — R51 Headache: Secondary | ICD-10-CM | POA: Diagnosis present

## 2015-06-25 DIAGNOSIS — M542 Cervicalgia: Secondary | ICD-10-CM | POA: Diagnosis present

## 2015-06-25 DIAGNOSIS — G629 Polyneuropathy, unspecified: Secondary | ICD-10-CM | POA: Insufficient documentation

## 2015-06-25 DIAGNOSIS — M461 Sacroiliitis, not elsewhere classified: Secondary | ICD-10-CM | POA: Diagnosis not present

## 2015-06-25 DIAGNOSIS — Z981 Arthrodesis status: Secondary | ICD-10-CM

## 2015-06-25 DIAGNOSIS — G90529 Complex regional pain syndrome I of unspecified lower limb: Secondary | ICD-10-CM

## 2015-06-25 DIAGNOSIS — M7062 Trochanteric bursitis, left hip: Secondary | ICD-10-CM

## 2015-06-25 DIAGNOSIS — M5030 Other cervical disc degeneration, unspecified cervical region: Secondary | ICD-10-CM

## 2015-06-25 DIAGNOSIS — M47816 Spondylosis without myelopathy or radiculopathy, lumbar region: Secondary | ICD-10-CM | POA: Insufficient documentation

## 2015-06-25 DIAGNOSIS — M7022 Olecranon bursitis, left elbow: Secondary | ICD-10-CM

## 2015-06-25 DIAGNOSIS — M48061 Spinal stenosis, lumbar region without neurogenic claudication: Secondary | ICD-10-CM

## 2015-06-25 DIAGNOSIS — M5136 Other intervertebral disc degeneration, lumbar region: Secondary | ICD-10-CM | POA: Diagnosis not present

## 2015-06-25 DIAGNOSIS — M7061 Trochanteric bursitis, right hip: Secondary | ICD-10-CM

## 2015-06-25 DIAGNOSIS — M5481 Occipital neuralgia: Secondary | ICD-10-CM

## 2015-06-25 DIAGNOSIS — M47817 Spondylosis without myelopathy or radiculopathy, lumbosacral region: Secondary | ICD-10-CM

## 2015-06-25 MED ORDER — METHADONE HCL 5 MG PO TABS: ORAL_TABLET | ORAL | Status: DC

## 2015-06-25 NOTE — Progress Notes (Signed)
Safety precautions to be maintained throughout the outpatient stay will include: orient to surroundings, keep bed in low position, maintain call bell within reach at all times, provide assistance with transfer out of bed and ambulation.  

## 2015-06-25 NOTE — Progress Notes (Signed)
Subjective:    Patient ID: Jamie Kim, female    DOB: [birth year], 54 y.o.   MRN: [id number]  HPI  The patient is a 54 year old female who returns to pain management Center for further evaluation and treatment of pain involving the region of the neck associated with headaches as well as pain involving upper extremity regions with prior surgical intervention of the cervical region and with weakness of the upper extremities. The patient has been considered for follow-up evaluation with [facility] neurosurgery department. Dr Pamala Hurry performed surgery of patient's cervical region previously. We discussed patient's overall condition and will proceed with interventional treatment for pain occurring in the cervical region associated with headaches at time return appointment. The patient also is a pain of the lumbar lower extremity region of lesser degree. We will continue medications as prescribed and we'll consider patient for follow-up evaluation neurosurgically as discussed and consider additional modifications of treatment regimen pending response to treatment and follow-up evaluation. The patient was with understanding and agreement suggested treatment plan. We will proceed with greater occipital nerve blocks to be performed at time return appointment as discussed and as explained to patient on today's visit. We will proceed with greater occipital nerve block in attempt to decrease severity of patient's symptoms, minimize progression of patient's symptoms, and avoid the need for more involved treatment. The greater occipital nerve blocks to be medically necessary procedure at this time. The patient was in agreement with suggested treatment plan     Review of Systems     Objective:   Physical Exam  There was tenderness to palpation of the splenius capitis and occipitalis musculature region a moderate to moderately severe degree. There were no definite findings on  Spurling's maneuver. There was tenderness of the splenius capitis and occipitalis musculature regions of moderate to moderately severe degree on the left as well as on the right. Palpation over the cervical facet cervical paraspinal musculature region reproduces moderate to moderately severe discomfort as well tends to palpation over the thoracic. Facet thoracic paraspinal musculature region reproduces moderate discomfort with no crepitus of the thoracic region noted. The patient appeared to be with decreased grip strength and Tinel and Phalen's maneuver were without increased pain of significant degree. Palpation over the region of the lumbar paraspinal musculature region lumbar facet region was with moderate tends to palpation with lateral bending rotation extension and palpation of the lumbar facets reproducing moderate discomfort. There was mild to moderate tenderness to palpation of the greater trochanteric region and iliotibial band regions. The knees were attends to palpation with negative anterior and posterior drawer signs without ballottement of the patella. There was no increased warmth erythema of the knees noted there was crepitus of the knees noted. EHL strength appeared to be decreased without a definite sensory deficit or dermatomal distribution detected. There was negative clonus negative Homans. Straight leg raising was tolerates approximately 30 without a definite increase of pain with dorsiflexion noted. No sensory deficit or dermatomal distribution was detected. Abdomen was nontender and no costovertebral tenderness was noted.      Assessment & Plan:    Neuropathy of extremities  Degenerative disc disease lumbar spine L3-4 and L4-5 degenerative changes with pain no disc bulging L4-L5 level with for left lateral disc component includes proximity to extraforaminal L5 nerve root and L5-S1 broad-based disc bulge with bilateral facet arthropathy  Degenerative changes of the cervical  spine C5-C6 C6-7 and C7 1 degenerative changes with prior cervical discectomy  and fusion  Sacroiliac joint dysfunction, sacroiliitis  Lumbar facet syndrome    PLAN  Continue present medication Cymbalta Neurontin Zanaflex and methadone Take vitamin B complex since insurance will not approve Metanx  Greater occipital nerve blocks to be performed at time of return appointment   F/U PCP  Dr. Geoffry Paradise for evaliation of  BP and general medical  condition  F/U surgical evaluation. May consider pending follow-up evaluations Patient will follow-up with Dr.Haglund as needed   F/U neurological evaluation. May consider pending follow-up evaluations  May consider radiofrequency rhizolysis or intraspinal procedures pending response to present treatment and F/U evaluation   Patient to call Pain Management Center should patient have concerns prior to scheduled return appointment.

## 2015-06-25 NOTE — Patient Instructions (Addendum)
PLAN   Continue present medication Cymbalta Neurontin Zanaflex and methadone Take vitamin B complex since insurance will not approve Metanx  Greater occipital nerve blocks next appt   F/U PCP  Dr. Geoffry Paradise for evaliation of  BP and general medical  condition  F/U surgical evaluation. May consider pending follow-up evaluations Patient will follow-up with Dr.Haglund as needed   F/U neurological evaluation. May consider pending follow-up evaluations  May consider radiofrequency rhizolysis or intraspinal procedures pending response to present treatment and F/U evaluation   Patient to call Pain Management Center should patient have concerns prior to scheduled return appointment. Occipital Nerve Block Patient Information  Description: The occipital nerves originate in the cervical (neck) spinal cord and travel upward through muscle and tissue to supply sensation to the back of the head and top of the scalp.  In addition, the nerves control some of the muscles of the scalp.  Occipital neuralgia is an irritation of these nerves which can cause headaches, numbness of the scalp, and neck discomfort.     The occipital nerve block will interrupt nerve transmission through these nerves and can relieve pain and spasm.  The block consists of insertion of a small needle under the skin in the back of the head to deposit local anesthetic (numbing medicine) and/or steroids around the nerve.  The entire block usually lasts less than 5 minutes.  Conditions which may be treated by occipital blocks:   Muscular pain and spasm of the scalp  Nerve irritation, back of the head  Headaches  Upper neck pain  Preparation for the injection:  1. Do not eat any solid food or dairy products within 6 hours of your appointment. 2. You may drink clear liquids up to 2 hours before appointment.  Clear liquids include water, black coffee, juice or soda.  No milk or cream please. 3. You may take your regular medication,  including pain medications, with a sip of water before you appointment.  Diabetics should hold regular insulin (if taken separately) and take 1/2 normal NPH dose the morning of the procedure.  Carry some sugar containing items with you to your appointment. 4. A driver must accompany you and be prepared to drive you home after your procedure. 5. Bring all your current medications with you. 6. An IV may be inserted and sedation may be given at the discretion of the physician. 7. A blood pressure cuff, EKG, and other monitors will often be applied during the procedure.  Some patients may need to have extra oxygen administered for a short period. 8. You will be asked to provide medical information, including your allergies and medications, prior to the procedure.  We must know immediately if you are taking blood thinners (like Coumadin/Warfarin) or if you are allergic to IV iodine contrast (dye).  We must know if you could possible be pregnant.  9. Do not wear a high collared shirt or turtleneck.  Tie long hair up in the back if possible.  Possible side-effects:   Bleeding from needle site  Infection (rare, may require surgery)  Nerve injury (rare)  Hair on back of neck can be tinged with iodine scrub (this will wash out)  Light-headedness (temporary)  Pain at injection site (several days)  Decreased blood pressure (rare, temporary)  Seizure (very rare)  Call if you experience:   Hives or difficulty breathing ( go to the emergency room)  Inflammation or drainage at the injection site(s)  Please note:  Although the local anesthetic injected  can often make your painful muscles or headache feel good for several hours after the injection, the pain may return.  It takes 3-7 days for steroids to work.  You may not notice any pain relief for at least one week.  If effective, we will often do a series of injections spaced 3-6 weeks apart to maximally decrease your pain.  If you have any  questions, please call [phone] [facility]

## 2015-07-24 ENCOUNTER — Encounter: Payer: Medicare Other | Admitting: Pain Medicine

## 2015-07-28 ENCOUNTER — Encounter: Payer: Self-pay | Admitting: Pain Medicine

## 2015-07-28 ENCOUNTER — Ambulatory Visit: Payer: Managed Care, Other (non HMO) | Attending: Pain Medicine | Admitting: Pain Medicine

## 2015-07-28 DIAGNOSIS — M533 Sacrococcygeal disorders, not elsewhere classified: Secondary | ICD-10-CM

## 2015-07-28 DIAGNOSIS — Z9889 Other specified postprocedural states: Secondary | ICD-10-CM | POA: Insufficient documentation

## 2015-07-28 DIAGNOSIS — M47817 Spondylosis without myelopathy or radiculopathy, lumbosacral region: Secondary | ICD-10-CM

## 2015-07-28 DIAGNOSIS — M542 Cervicalgia: Secondary | ICD-10-CM | POA: Diagnosis not present

## 2015-07-28 DIAGNOSIS — M5030 Other cervical disc degeneration, unspecified cervical region: Secondary | ICD-10-CM

## 2015-07-28 DIAGNOSIS — R51 Headache: Secondary | ICD-10-CM | POA: Diagnosis not present

## 2015-07-28 DIAGNOSIS — M5136 Other intervertebral disc degeneration, lumbar region: Secondary | ICD-10-CM

## 2015-07-28 DIAGNOSIS — Z981 Arthrodesis status: Secondary | ICD-10-CM

## 2015-07-28 DIAGNOSIS — M48061 Spinal stenosis, lumbar region without neurogenic claudication: Secondary | ICD-10-CM

## 2015-07-28 DIAGNOSIS — M47812 Spondylosis without myelopathy or radiculopathy, cervical region: Secondary | ICD-10-CM

## 2015-07-28 DIAGNOSIS — M7022 Olecranon bursitis, left elbow: Secondary | ICD-10-CM

## 2015-07-28 DIAGNOSIS — M5481 Occipital neuralgia: Secondary | ICD-10-CM

## 2015-07-28 DIAGNOSIS — M7062 Trochanteric bursitis, left hip: Secondary | ICD-10-CM

## 2015-07-28 DIAGNOSIS — M7061 Trochanteric bursitis, right hip: Secondary | ICD-10-CM

## 2015-07-28 DIAGNOSIS — G90529 Complex regional pain syndrome I of unspecified lower limb: Secondary | ICD-10-CM

## 2015-07-28 MED ORDER — TRIAMCINOLONE ACETONIDE 40 MG/ML IJ SUSP
INTRAMUSCULAR | Status: AC
  Administered 2015-07-28: 13:00:00
  Filled 2015-07-28: qty 1

## 2015-07-28 MED ORDER — LACTATED RINGERS IV SOLN: 1000.0000 mL | INTRAVENOUS | Status: DC

## 2015-07-28 MED ORDER — FENTANYL CITRATE (PF) 100 MCG/2ML IJ SOLN
INTRAMUSCULAR | Status: AC
  Administered 2015-07-28: 100 ug via INTRAVENOUS
  Filled 2015-07-28: qty 2

## 2015-07-28 MED ORDER — ORPHENADRINE CITRATE 30 MG/ML IJ SOLN: 60.0000 mg | Freq: Once | INTRAMUSCULAR | Status: DC

## 2015-07-28 MED ORDER — FENTANYL CITRATE (PF) 100 MCG/2ML IJ SOLN: 100.0000 ug | Freq: Once | INTRAMUSCULAR | Status: DC

## 2015-07-28 MED ORDER — MIDAZOLAM HCL 5 MG/5ML IJ SOLN
INTRAMUSCULAR | Status: AC
  Administered 2015-07-28: 3 mg via INTRAVENOUS
  Filled 2015-07-28: qty 5

## 2015-07-28 MED ORDER — MIDAZOLAM HCL 5 MG/5ML IJ SOLN: 5.0000 mg | Freq: Once | INTRAMUSCULAR | Status: DC

## 2015-07-28 MED ORDER — ORPHENADRINE CITRATE 30 MG/ML IJ SOLN
INTRAMUSCULAR | Status: AC
  Filled 2015-07-28: qty 2

## 2015-07-28 MED ORDER — BUPIVACAINE HCL (PF) 0.25 % IJ SOLN
INTRAMUSCULAR | Status: AC
  Administered 2015-07-28: 13:00:00
  Filled 2015-07-28: qty 30

## 2015-07-28 MED ORDER — TRIAMCINOLONE ACETONIDE 40 MG/ML IJ SUSP: 40.0000 mg | Freq: Once | INTRAMUSCULAR | Status: DC

## 2015-07-28 MED ORDER — BUPIVACAINE HCL (PF) 0.25 % IJ SOLN: 30.0000 mL | Freq: Once | INTRAMUSCULAR | Status: DC

## 2015-07-28 MED ORDER — METHADONE HCL 5 MG PO TABS: ORAL_TABLET | ORAL | Status: DC

## 2015-07-28 NOTE — Progress Notes (Signed)
Safety precautions to be maintained throughout the outpatient stay will include: orient to surroundings, keep bed in low position, maintain call bell within reach at all times, provide assistance with transfer out of bed and ambulation.  

## 2015-07-28 NOTE — Patient Instructions (Addendum)
PLAN   Continue present medication Cymbalta Neurontin Zanaflex and methadone Take vitamin B complex since insurance will not approve Metanx  Greater occipital nerve blocks next appt   F/U PCP  Dr. Geoffry Paradise for evaliation of  BP and general medical  condition  F/U surgical evaluation. May consider pending follow-up evaluations Patient will follow-up with Dr.Haglund as needed   F/U neurological evaluation. May consider pending follow-up evaluations  May consider radiofrequency rhizolysis or intraspinal procedures pending response to present treatment and F/U evaluation   Patient to call [facility] should patient have concerns prior to scheduled return appointment.Pain Management Discharge Instructions  General Discharge Instructions :  If you need to reach your doctor call: Monday-Friday 8:00 am - 4:00 pm at [phone] or toll free [phone].  After clinic hours [phone] to have operator reach doctor.  Bring all of your medication bottles to all your appointments in the pain clinic.  To cancel or reschedule your appointment with Pain Management please remember to call 24 hours in advance to avoid a fee.  Refer to the educational materials which you have been given on: General Risks, I had my Procedure. Discharge Instructions, Post Sedation.  Post Procedure Instructions:  The drugs you were given will stay in your system until tomorrow, so for the next 24 hours you should not drive, make any legal decisions or drink any alcoholic beverages.  You may eat anything you prefer, but it is better to start with liquids then soups and crackers, and gradually work up to solid foods.  Please notify your doctor immediately if you have any unusual bleeding, trouble breathing or pain that is not related to your normal pain.  Depending on the type of procedure that was done, some parts of your body may feel week and/or numb.  This usually clears up by tonight or the next  day.  Walk with the use of an assistive device or accompanied by an adult for the 24 hours.  You may use ice on the affected area for the first 24 hours.  Put ice in a Ziploc bag and cover with a towel and place against area 15 minutes on 15 minutes off.  You may switch to heat after 24 hours.Occipital Nerve Block Patient Information  Description: The occipital nerves originate in the cervical (neck) spinal cord and travel upward through muscle and tissue to supply sensation to the back of the head and top of the scalp.  In addition, the nerves control some of the muscles of the scalp.  Occipital neuralgia is an irritation of these nerves which can cause headaches, numbness of the scalp, and neck discomfort.     The occipital nerve block will interrupt nerve transmission through these nerves and can relieve pain and spasm.  The block consists of insertion of a small needle under the skin in the back of the head to deposit local anesthetic (numbing medicine) and/or steroids around the nerve.  The entire block usually lasts less than 5 minutes.  Conditions which may be treated by occipital blocks:   Muscular pain and spasm of the scalp  Nerve irritation, back of the head  Headaches  Upper neck pain  Preparation for the injection:  1. Do not eat any solid food or dairy products within 6 hours of your appointment. 2. You may drink clear liquids up to 2 hours before appointment.  Clear liquids include water, black coffee, juice or soda.  No milk or cream please. 3. You may take  your regular medication, including pain medications, with a sip of water before you appointment.  Diabetics should hold regular insulin (if taken separately) and take 1/2 normal NPH dose the morning of the procedure.  Carry some sugar containing items with you to your appointment. 4. A driver must accompany you and be prepared to drive you home after your procedure. 5. Bring all your current medications with you. 6. An  IV may be inserted and sedation may be given at the discretion of the physician. 7. A blood pressure cuff, EKG, and other monitors will often be applied during the procedure.  Some patients may need to have extra oxygen administered for a short period. 8. You will be asked to provide medical information, including your allergies and medications, prior to the procedure.  We must know immediately if you are taking blood thinners (like Coumadin/Warfarin) or if you are allergic to IV iodine contrast (dye).  We must know if you could possible be pregnant.  9. Do not wear a high collared shirt or turtleneck.  Tie long hair up in the back if possible.  Possible side-effects:   Bleeding from needle site  Infection (rare, may require surgery)  Nerve injury (rare)  Hair on back of neck can be tinged with iodine scrub (this will wash out)  Light-headedness (temporary)  Pain at injection site (several days)  Decreased blood pressure (rare, temporary)  Seizure (very rare)  Call if you experience:   Hives or difficulty breathing ( go to the emergency room)  Inflammation or drainage at the injection site(s)  Please note:  Although the local anesthetic injected can often make your painful muscles or headache feel good for several hours after the injection, the pain may return.  It takes 3-7 days for steroids to work.  You may not notice any pain relief for at least one week.  If effective, we will often do a series of injections spaced 3-6 weeks apart to maximally decrease your pain.  If you have any questions, please call [phone] [facility]

## 2015-07-28 NOTE — Progress Notes (Signed)
   Subjective:    Patient ID: Jamie Kim, female    DOB: 1961-01-05, 55 y.o.   MRN: [id number]  HPI  NOTE: The patient is a 55 y.o.-year-old female who returns to the [facility] for further evaluation and treatment of pain consisting of pain involving the region of the neck and headache.  Patient is with prior studies revealing patient to be with degenerative changes of the cervical spine with prior surgical intervention of the cervical region. The patient is with severe pain radiating from the cervical region toward the occipital region. There is concern regarding significant component of patient's headaches been due to greater occipital neuralgia .  The risks, benefits, and expectations of the procedure have been discussed and explained to patient, who is understanding and wishes to proceed with interventional treatment as discussed and as explained to patient.  Will proceed with greater occipital nerve blocks with myoneural block injections at this time as discussed and as explained to patient.  All are understanding and in agreement with suggested treatment plan.    PROCEDURE:  Greater occipital nerve block on the left side with IV Versed, IV Fentanyl, conscious sedation, EKG, blood pressure, pulse, pulse oximetry monitoring.  Procedure performed with patient in prone position.  Greater occipital nerve block on the left side.   With patient in prone position, Betadine prep of proposed entry site accomplished.  Following identification of the nuchal ridge, 22 -gauge needle was inserted at the level of the nuchal ridge medial to the occipital artery.  Following negative aspiration, 4cc 0.25% bupivacaine with Kenalog injected for left greater occipital nerve block.  Needle was removed.  Patient tolerated injection well.   Greater occipital nerve block on the rightt side. The greater occipital nerve block on the right side was performed exactly as the left greater occipital nerve  block was performed and utilizing the same technique.  Myoneural block injections of the cervical paraspinal musculature region Following Betadine prep of proposed entry site a 22-gauge needle was inserted in the cervical paraspinal musculature region and following negative aspiration 2 cc of 0.25% bupivacaine was injected in the cervical paraspinal musculature region 4  The patient tolerated procedure well   A total of 10 mg Kenalog was utilized for the entire procedure.  PLAN:    1. Medications: Will continue presently prescribed medications Neurontin Zanaflex Cymbalta vitamin B complex and methadone at this time. 2. Patient to follow up with primary care physician Dr.Hamrick  for evaluation of blood pressure and general medical condition status post procedure performed on today's visit. 3. Neurological evaluation for further assessment of headaches for further studies as discussed. 4. Surgical evaluation as discussed. Patient will follow-up with Dr. Pamala Hurry of [facility]   5. Patient may be candidate for Botox injections, radiofrequency procedures, as well as implantation type procedures pending response to treatment rendered on today's visit and pending follow-up evaluation 6. Psych evaluation has been addressed 7. Patient has been advised to adhere to proper body mechanics and to avoid activities which appear to aggravate condition.cations:  Will continue presently prescribed medications at this time. The patient is understanding and in agreement with the suggested treatment plan.   Review of Systems     Objective:   Physical Exam        Assessment & Plan:

## 2015-07-29 ENCOUNTER — Telehealth: Payer: Self-pay | Admitting: *Deleted

## 2015-07-29 NOTE — Telephone Encounter (Signed)
Message left

## 2015-08-18 ENCOUNTER — Ambulatory Visit: Payer: Managed Care, Other (non HMO) | Attending: Pain Medicine | Admitting: Pain Medicine

## 2015-08-18 ENCOUNTER — Encounter: Payer: Self-pay | Admitting: Pain Medicine

## 2015-08-18 VITALS — BP 115/85 | HR 67 | Temp 97.8°F | Resp 16 | Ht 70.0 in | Wt 175.0 lb

## 2015-08-18 DIAGNOSIS — M7061 Trochanteric bursitis, right hip: Secondary | ICD-10-CM

## 2015-08-18 DIAGNOSIS — M542 Cervicalgia: Secondary | ICD-10-CM | POA: Diagnosis present

## 2015-08-18 DIAGNOSIS — M19041 Primary osteoarthritis, right hand: Secondary | ICD-10-CM | POA: Insufficient documentation

## 2015-08-18 DIAGNOSIS — M533 Sacrococcygeal disorders, not elsewhere classified: Secondary | ICD-10-CM | POA: Insufficient documentation

## 2015-08-18 DIAGNOSIS — M47816 Spondylosis without myelopathy or radiculopathy, lumbar region: Secondary | ICD-10-CM | POA: Diagnosis not present

## 2015-08-18 DIAGNOSIS — G90529 Complex regional pain syndrome I of unspecified lower limb: Secondary | ICD-10-CM

## 2015-08-18 DIAGNOSIS — M48061 Spinal stenosis, lumbar region without neurogenic claudication: Secondary | ICD-10-CM

## 2015-08-18 DIAGNOSIS — M546 Pain in thoracic spine: Secondary | ICD-10-CM | POA: Diagnosis present

## 2015-08-18 DIAGNOSIS — M461 Sacroiliitis, not elsewhere classified: Secondary | ICD-10-CM | POA: Insufficient documentation

## 2015-08-18 DIAGNOSIS — Z9889 Other specified postprocedural states: Secondary | ICD-10-CM | POA: Diagnosis not present

## 2015-08-18 DIAGNOSIS — M19042 Primary osteoarthritis, left hand: Secondary | ICD-10-CM | POA: Diagnosis not present

## 2015-08-18 DIAGNOSIS — Z981 Arthrodesis status: Secondary | ICD-10-CM

## 2015-08-18 DIAGNOSIS — M47817 Spondylosis without myelopathy or radiculopathy, lumbosacral region: Secondary | ICD-10-CM

## 2015-08-18 DIAGNOSIS — M5030 Other cervical disc degeneration, unspecified cervical region: Secondary | ICD-10-CM

## 2015-08-18 DIAGNOSIS — M5136 Other intervertebral disc degeneration, lumbar region: Secondary | ICD-10-CM | POA: Diagnosis not present

## 2015-08-18 DIAGNOSIS — M5481 Occipital neuralgia: Secondary | ICD-10-CM | POA: Insufficient documentation

## 2015-08-18 DIAGNOSIS — R51 Headache: Secondary | ICD-10-CM | POA: Diagnosis present

## 2015-08-18 DIAGNOSIS — M7022 Olecranon bursitis, left elbow: Secondary | ICD-10-CM

## 2015-08-18 DIAGNOSIS — M7062 Trochanteric bursitis, left hip: Secondary | ICD-10-CM

## 2015-08-18 DIAGNOSIS — M5126 Other intervertebral disc displacement, lumbar region: Secondary | ICD-10-CM | POA: Diagnosis not present

## 2015-08-18 DIAGNOSIS — M47812 Spondylosis without myelopathy or radiculopathy, cervical region: Secondary | ICD-10-CM | POA: Diagnosis not present

## 2015-08-18 MED ORDER — METHADONE HCL 5 MG PO TABS: ORAL_TABLET | ORAL | Status: DC

## 2015-08-18 MED ORDER — GABAPENTIN 600 MG PO TABS: ORAL_TABLET | ORAL | Status: DC

## 2015-08-18 MED ORDER — TIZANIDINE HCL 2 MG PO TABS: ORAL_TABLET | ORAL | Status: DC

## 2015-08-18 MED ORDER — DULOXETINE HCL 60 MG PO CPEP: ORAL_CAPSULE | ORAL | Status: DC

## 2015-08-18 NOTE — Progress Notes (Signed)
Safety precautions to be maintained throughout the outpatient stay will include: orient to surroundings, keep bed in low position, maintain call bell within reach at all times, provide assistance with transfer out of bed and ambulation.  

## 2015-08-18 NOTE — Patient Instructions (Addendum)
PLAN   Continue present medication Cymbalta Neurontin Zanaflex and methadone Take vitamin B complex since insurance will not approve Metanx  Greater occipital nerve blocks to be performed at time of return appointment   F/U PCP  Dr. Geoffry Paradise for evaliation of  BP and general medical  condition  F/U surgical evaluation. May consider pending follow-up evaluations Patient will follow-up with Dr.Haglund as needed   F/U neurological evaluation. May consider pending follow-up evaluations  Ask secretary the date of your appointment with hand surgeon  May consider radiofrequency rhizolysis or intraspinal procedures pending response to present treatment and F/U evaluation   Patient to call Pain Management Center should patient have concerns prior to scheduled return appointment. Occipital Nerve Block Patient Information  Description: The occipital nerves originate in the cervical (neck) spinal cord and travel upward through muscle and tissue to supply sensation to the back of the head and top of the scalp.  In addition, the nerves control some of the muscles of the scalp.  Occipital neuralgia is an irritation of these nerves which can cause headaches, numbness of the scalp, and neck discomfort.     The occipital nerve block will interrupt nerve transmission through these nerves and can relieve pain and spasm.  The block consists of insertion of a small needle under the skin in the back of the head to deposit local anesthetic (numbing medicine) and/or steroids around the nerve.  The entire block usually lasts less than 5 minutes.  Conditions which may be treated by occipital blocks:   Muscular pain and spasm of the scalp  Nerve irritation, back of the head  Headaches  Upper neck pain  Preparation for the injection:  1. Do not eat any solid food or dairy products within 6 hours of your appointment. 2. You may drink clear liquids up to 2 hours before appointment.  Clear liquids include  water, black coffee, juice or soda.  No milk or cream please. 3. You may take your regular medication, including pain medications, with a sip of water before you appointment.  Diabetics should hold regular insulin (if taken separately) and take 1/2 normal NPH dose the morning of the procedure.  Carry some sugar containing items with you to your appointment. 4. A driver must accompany you and be prepared to drive you home after your procedure. 5. Bring all your current medications with you. 6. An IV may be inserted and sedation may be given at the discretion of the physician. 7. A blood pressure cuff, EKG, and other monitors will often be applied during the procedure.  Some patients may need to have extra oxygen administered for a short period. 8. You will be asked to provide medical information, including your allergies and medications, prior to the procedure.  We must know immediately if you are taking blood thinners (like Coumadin/Warfarin) or if you are allergic to IV iodine contrast (dye).  We must know if you could possible be pregnant.  9. Do not wear a high collared shirt or turtleneck.  Tie long hair up in the back if possible.  Possible side-effects:   Bleeding from needle site  Infection (rare, may require surgery)  Nerve injury (rare)  Hair on back of neck can be tinged with iodine scrub (this will wash out)  Light-headedness (temporary)  Pain at injection site (several days)  Decreased blood pressure (rare, temporary)  Seizure (very rare)  Call if you experience:   Hives or difficulty breathing ( go to the emergency room)  Inflammation or drainage at the injection site(s)  Please note:  Although the local anesthetic injected can often make your painful muscles or headache feel good for several hours after the injection, the pain may return.  It takes 3-7 days for steroids to work.  You may not notice any pain relief for at least one week.  If effective, we will  often do a series of injections spaced 3-6 weeks apart to maximally decrease your pain.  If you have any questions, please call [phone] [facility]  What are the risk, side effects and possible complications? Generally speaking, most procedures are safe.  However, with any procedure there are risks, side effects, and the possibility of complications.  The risks and complications are dependent upon the sites that are lesioned, or the type of nerve block to be performed.  The closer the procedure is to the spine, the more serious the risks are.  Great care is taken when placing the radio frequency needles, block needles or lesioning probes, but sometimes complications can occur. 1. Infection: Any time there is an injection through the skin, there is a risk of infection.  This is why sterile conditions are used for these blocks.  There are four possible types of infection. 1. Localized skin infection. 2. Central Nervous System Infection-This can be in the form of Meningitis, which can be deadly. 3. Epidural Infections-This can be in the form of an epidural abscess, which can cause pressure inside of the spine, causing compression of the spinal cord with subsequent paralysis. This would require an emergency surgery to decompress, and there are no guarantees that the patient would recover from the paralysis. 4. Discitis-This is an infection of the intervertebral discs.  It occurs in about 1% of discography procedures.  It is difficult to treat and it may lead to surgery.        2. Pain: the needles have to go through skin and soft tissues, will cause soreness.       3. Damage to internal structures:  The nerves to be lesioned may be near blood vessels or    other nerves which can be potentially damaged.       4. Bleeding: Bleeding is more common if the patient is taking blood thinners such as  aspirin, Coumadin, Ticiid, Plavix, etc.,  or if he/she have some genetic predisposition  such as hemophilia. Bleeding into the spinal canal can cause compression of the spinal  cord with subsequent paralysis.  This would require an emergency surgery to  decompress and there are no guarantees that the patient would recover from the  paralysis.       5. Pneumothorax:  Puncturing of a lung is a possibility, every time a needle is introduced in  the area of the chest or upper back.  Pneumothorax refers to free air around the  collapsed lung(s), inside of the thoracic cavity (chest cavity).  Another two possible  complications related to a similar event would include: Hemothorax and Chylothorax.   These are variations of the Pneumothorax, where instead of air around the collapsed  lung(s), you may have blood or chyle, respectively.       6. Spinal headaches: They may occur with any procedures in the area of the spine.       7. Persistent CSF (Cerebro-Spinal Fluid) leakage: This is a rare problem, but may occur  with prolonged intrathecal or epidural catheters either due to the  formation of a fistulous  track or a dural tear.       8. Nerve damage: By working so close to the spinal cord, there is always a possibility of  nerve damage, which could be as serious as a permanent spinal cord injury with  paralysis.       9. Death:  Although rare, severe deadly allergic reactions known as "Anaphylactic  reaction" can occur to any of the medications used.      10. Worsening of the symptoms:  We can always make thing worse.  What are the chances of something like this happening? Chances of any of this occuring are extremely low.  By statistics, you have more of a chance of getting killed in a motor vehicle accident: while driving to the hospital than any of the above occurring .  Nevertheless, you should be aware that they are possibilities.  In general, it is similar to taking a shower.  Everybody knows that you can slip, hit your head and get killed.  Does  that mean that you should not shower again?  Nevertheless always keep in mind that statistics do not mean anything if you happen to be on the wrong side of them.  Even if a procedure has a 1 (one) in a 1,000,000 (million) chance of going wrong, it you happen to be that one..Also, keep in mind that by statistics, you have more of a chance of having something go wrong when taking medications.  Who should not have this procedure? If you are on a blood thinning medication (e.g. Coumadin, Plavix, see list of "Blood Thinners"), or if you have an active infection going on, you should not have the procedure.  If you are taking any blood thinners, please inform your physician.  How should I prepare for this procedure?  Do not eat or drink anything at least six hours prior to the procedure.  Bring a driver with you .  It cannot be a taxi.  Come accompanied by an adult that can drive you back, and that is strong enough to help you if your legs get weak or numb from the local anesthetic.  Take all of your medicines the morning of the procedure with just enough water to swallow them.  If you have diabetes, make sure that you are scheduled to have your procedure done first thing in the morning, whenever possible.  If you have diabetes, take only half of your insulin dose and notify our nurse that you have done so as soon as you arrive at the clinic.  If you are diabetic, but only take blood sugar pills (oral hypoglycemic), then do not take them on the morning of your procedure.  You may take them after you have had the procedure.  Do not take aspirin or any aspirin-containing medications, at least eleven (11) days prior to the procedure.  They may prolong bleeding.  Wear loose fitting clothing that may be easy to take off and that you would not mind if it got stained with Betadine or blood.  Do not wear any jewelry or perfume  Remove any nail coloring.  It will interfere with some of our monitoring  equipment.  NOTE: Remember that this is not meant to be interpreted as a complete list of all possible complications.  Unforeseen problems may occur.  BLOOD THINNERS The following drugs contain aspirin or other products, which can cause increased bleeding during surgery and should not be taken for 2 weeks prior to  and 1 week after surgery.  If you should need take something for relief of minor pain, you may take acetaminophen which is found in Tylenol,m Datril, Anacin-3 and Panadol. It is not blood thinner. The products listed below are.  Do not take any of the products listed below in addition to any listed on your instruction sheet.  A.P.C or A.P.C with Codeine Codeine Phosphate Capsules #3 Ibuprofen Ridaura  ABC compound Congesprin Imuran rimadil  Advil Cope Indocin Robaxisal  Alka-Seltzer Effervescent Pain Reliever and Antacid Coricidin or Coricidin-D  Indomethacin Rufen  Alka-Seltzer plus Cold Medicine Cosprin Ketoprofen S-A-C Tablets  Anacin Analgesic Tablets or Capsules Coumadin Korlgesic Salflex  Anacin Extra Strength Analgesic tablets or capsules CP-2 Tablets Lanoril Salicylate  Anaprox Cuprimine Capsules Levenox Salocol  Anexsia-D Dalteparin Magan Salsalate  Anodynos Darvon compound Magnesium Salicylate Sine-off  Ansaid Dasin Capsules Magsal Sodium Salicylate  Anturane Depen Capsules Marnal Soma  APF Arthritis pain formula Dewitt's Pills Measurin Stanback  Argesic Dia-Gesic Meclofenamic Sulfinpyrazone  Arthritis Bayer Timed Release Aspirin Diclofenac Meclomen Sulindac  Arthritis pain formula Anacin Dicumarol Medipren Supac  Analgesic (Safety coated) Arthralgen Diffunasal Mefanamic Suprofen  Arthritis Strength Bufferin Dihydrocodeine Mepro Compound Suprol  Arthropan liquid Dopirydamole Methcarbomol with Aspirin Synalgos  ASA tablets/Enseals Disalcid Micrainin Tagament  Ascriptin Doan's Midol Talwin  Ascriptin A/D Dolene Mobidin Tanderil  Ascriptin Extra Strength Dolobid  Moblgesic Ticlid  Ascriptin with Codeine Doloprin or Doloprin with Codeine Momentum Tolectin  Asperbuf Duoprin Mono-gesic Trendar  Aspergum Duradyne Motrin or Motrin IB Triminicin  Aspirin plain, buffered or enteric coated Durasal Myochrisine Trigesic  Aspirin Suppositories Easprin Nalfon Trillsate  Aspirin with Codeine Ecotrin Regular or Extra Strength Naprosyn Uracel  Atromid-S Efficin Naproxen Ursinus  Auranofin Capsules Elmiron Neocylate Vanquish  Axotal Emagrin Norgesic Verin  Azathioprine Empirin or Empirin with Codeine Normiflo Vitamin E  Azolid Emprazil Nuprin Voltaren  Bayer Aspirin plain, buffered or children's or timed BC Tablets or powders Encaprin Orgaran Warfarin Sodium  Buff-a-Comp Enoxaparin Orudis Zorpin  Buff-a-Comp with Codeine Equegesic Os-Cal-Gesic   Buffaprin Excedrin plain, buffered or Extra Strength Oxalid   Bufferin Arthritis Strength Feldene Oxphenbutazone   Bufferin plain or Extra Strength Feldene Capsules Oxycodone with Aspirin   Bufferin with Codeine Fenoprofen Fenoprofen Pabalate or Pabalate-SF   Buffets II Flogesic Panagesic   Buffinol plain or Extra Strength Florinal or Florinal with Codeine Panwarfarin   Buf-Tabs Flurbiprofen Penicillamine   Butalbital Compound Four-way cold tablets Penicillin   Butazolidin Fragmin Pepto-Bismol   Carbenicillin Geminisyn Percodan   Carna Arthritis Reliever Geopen Persantine   Carprofen Gold's salt Persistin   Chloramphenicol Goody's Phenylbutazone   Chloromycetin Haltrain Piroxlcam   Clmetidine heparin Plaquenil   Cllnoril Hyco-pap Ponstel   Clofibrate Hydroxy chloroquine Propoxyphen         Before stopping any of these medications, be sure to consult the physician who ordered them.  Some, such as Coumadin (Warfarin) are ordered to prevent or treat serious conditions such as "deep thrombosis", "pumonary embolisms", and other heart problems.  The amount of time that you may need off of the medication may also vary with  the medication and the reason for which you were taking it.  If you are taking any of these medications, please make sure you notify your pain physician before you undergo any procedures.

## 2015-08-18 NOTE — Progress Notes (Signed)
Subjective:    Patient ID: Jamie Kim, female    DOB: [birth year], 55 y.o.   MRN: [id number]  HPI  The patient is a 55 year old female who returns to pain management for further evaluation and treatment of pain involving the neck with pain in the neck radiating to the back of the hip precipitating headaches. The patient is status post surgical intervention of the cervical region without plans for additional surgery at this time. Patient also has had difficulty picking up objects with some weakness of the lower extremities. Patient admits to significant pain occurring in the region of the hand with pain occurring at the base of the thumb with some weakness of the upper extremities as well. We discussed further evaluation with neurosurgeon Dr.Haglund previously and at the present time we will refer patient to hand surgeon Dr.Grammig for further evaluation of pain of the hands and weakness of the hands. We will also schedule patient for greater occipital nerve block to be performed at time return appointment was severely disabling pain occurring in the region of the neck associated with severe headache. The patient states that the headaches are similar headache she was previously had which have responded to greater occipital nerve blocks previously. We will continue presently prescribed medications. All understanding and agreement suggested treatment plan the patient denies trauma change in events of daily living the call significant change in symptomatology    Review of Systems     Objective:   Physical Exam  There was moderately severe tends to palpation of the splenius capitis and occipitalis musculature regions on the left as well as on the right. There was moderate tenderness of the cervical facet cervical paraspinal musculature region. The patient appeared to be with slightly decreased grip strength with significant pain at the base of the thumbs the left and right hands with no increased  warmth erythema of the hands noted there was decreased grip strength of the hands noted no obvious nodules or nodes of the hands were noted. Palpation over the metacarpophalangeal region was with moderate tends to palpation as well. Tinel and Phalen's maneuver associated with mild to moderate discomfort. There was tenderness of the acromial clavicular and glenohumeral joint region and patient appeared to be with unremarkable Spurling's maneuver. Palpation of the thoracic facet thoracic paraspinal must reason associated with moderate muscle spasm without crepitus of the thoracic region. Palpation of the lumbar paraspinal muscles lumbar facet region associated with moderate discomfort with lateral bending rotation extension and palpation over the lumbar facets reproducing moderate discomfort. Straight leg raise was tolerated to approximately 20 without increased pain with dorsiflexion noted with negative clonus negative Homans. EHL strength slightly decreased There was no definite sensory deficit or dermatomal distribution detected. There was negative clonus negative Homans. Abdomen nontender with no costovertebral tenderness noted.      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 Geoffry Paradise for evaliation of  BP and general medical  condition  F/U surgical evaluation. May consider pending follow-up evaluations Patient will follow-up with Dr.Haglund as needed   F/U neurological evaluation. May consider pending follow-up evaluations  Ask secretary the date of your appointment with hand surgeon  May consider radiofrequency rhizolysis or intraspinal procedures pending response to present treatment and F/U evaluation   Patient to call [facility] should patient have concerns prior to scheduled return appointment.

## 2015-08-27 DIAGNOSIS — E782 Mixed hyperlipidemia: Secondary | ICD-10-CM | POA: Diagnosis not present

## 2015-08-27 DIAGNOSIS — I10 Essential (primary) hypertension: Secondary | ICD-10-CM | POA: Diagnosis not present

## 2015-08-27 DIAGNOSIS — K219 Gastro-esophageal reflux disease without esophagitis: Secondary | ICD-10-CM | POA: Diagnosis not present

## 2015-08-27 DIAGNOSIS — E559 Vitamin D deficiency, unspecified: Secondary | ICD-10-CM | POA: Diagnosis not present

## 2015-08-27 DIAGNOSIS — E663 Overweight: Secondary | ICD-10-CM | POA: Diagnosis not present

## 2015-08-27 DIAGNOSIS — R7303 Prediabetes: Secondary | ICD-10-CM | POA: Diagnosis not present

## 2015-08-27 DIAGNOSIS — E042 Nontoxic multinodular goiter: Secondary | ICD-10-CM | POA: Diagnosis not present

## 2015-08-27 DIAGNOSIS — Z6828 Body mass index (BMI) 28.0-28.9, adult: Secondary | ICD-10-CM | POA: Diagnosis not present

## 2015-09-03 ENCOUNTER — Other Ambulatory Visit: Payer: Self-pay | Admitting: Pain Medicine

## 2015-09-11 ENCOUNTER — Ambulatory Visit: Payer: Managed Care, Other (non HMO) | Attending: Pain Medicine | Admitting: Pain Medicine

## 2015-09-11 VITALS — BP 126/95 | HR 71 | Temp 98.1°F | Resp 16 | Ht 70.0 in | Wt 170.0 lb

## 2015-09-11 DIAGNOSIS — Z981 Arthrodesis status: Secondary | ICD-10-CM

## 2015-09-11 DIAGNOSIS — M5136 Other intervertebral disc degeneration, lumbar region: Secondary | ICD-10-CM | POA: Diagnosis not present

## 2015-09-11 DIAGNOSIS — M461 Sacroiliitis, not elsewhere classified: Secondary | ICD-10-CM | POA: Insufficient documentation

## 2015-09-11 DIAGNOSIS — M19041 Primary osteoarthritis, right hand: Secondary | ICD-10-CM

## 2015-09-11 DIAGNOSIS — Z9889 Other specified postprocedural states: Secondary | ICD-10-CM | POA: Diagnosis not present

## 2015-09-11 DIAGNOSIS — G5603 Carpal tunnel syndrome, bilateral upper limbs: Secondary | ICD-10-CM

## 2015-09-11 DIAGNOSIS — M19042 Primary osteoarthritis, left hand: Secondary | ICD-10-CM

## 2015-09-11 DIAGNOSIS — M533 Sacrococcygeal disorders, not elsewhere classified: Secondary | ICD-10-CM

## 2015-09-11 DIAGNOSIS — M7062 Trochanteric bursitis, left hip: Secondary | ICD-10-CM

## 2015-09-11 DIAGNOSIS — M47817 Spondylosis without myelopathy or radiculopathy, lumbosacral region: Secondary | ICD-10-CM

## 2015-09-11 DIAGNOSIS — M5481 Occipital neuralgia: Secondary | ICD-10-CM

## 2015-09-11 DIAGNOSIS — M5030 Other cervical disc degeneration, unspecified cervical region: Secondary | ICD-10-CM | POA: Diagnosis not present

## 2015-09-11 DIAGNOSIS — M542 Cervicalgia: Secondary | ICD-10-CM | POA: Diagnosis present

## 2015-09-11 DIAGNOSIS — G609 Hereditary and idiopathic neuropathy, unspecified: Secondary | ICD-10-CM

## 2015-09-11 DIAGNOSIS — M47812 Spondylosis without myelopathy or radiculopathy, cervical region: Secondary | ICD-10-CM | POA: Diagnosis not present

## 2015-09-11 DIAGNOSIS — M7022 Olecranon bursitis, left elbow: Secondary | ICD-10-CM

## 2015-09-11 DIAGNOSIS — R51 Headache: Secondary | ICD-10-CM | POA: Diagnosis not present

## 2015-09-11 DIAGNOSIS — M48061 Spinal stenosis, lumbar region without neurogenic claudication: Secondary | ICD-10-CM

## 2015-09-11 DIAGNOSIS — M47816 Spondylosis without myelopathy or radiculopathy, lumbar region: Secondary | ICD-10-CM | POA: Insufficient documentation

## 2015-09-11 DIAGNOSIS — M545 Low back pain: Secondary | ICD-10-CM | POA: Diagnosis present

## 2015-09-11 DIAGNOSIS — M1288 Other specific arthropathies, not elsewhere classified, other specified site: Secondary | ICD-10-CM | POA: Diagnosis not present

## 2015-09-11 DIAGNOSIS — M7061 Trochanteric bursitis, right hip: Secondary | ICD-10-CM

## 2015-09-11 DIAGNOSIS — G90529 Complex regional pain syndrome I of unspecified lower limb: Secondary | ICD-10-CM

## 2015-09-11 DIAGNOSIS — M51369 Other intervertebral disc degeneration, lumbar region without mention of lumbar back pain or lower extremity pain: Secondary | ICD-10-CM

## 2015-09-11 DIAGNOSIS — M5010 Cervical disc disorder with radiculopathy, unspecified cervical region: Secondary | ICD-10-CM

## 2015-09-11 MED ORDER — METHADONE HCL 5 MG PO TABS: ORAL_TABLET | ORAL | Status: DC

## 2015-09-11 NOTE — Progress Notes (Signed)
Subjective:    Patient ID: Jamie Kim, female    DOB: 1961/06/28, 55 y.o.   MRN: [id number]  HPI The patient is a 55 year old female who returns to pain management for further evaluation and treatment of pain involving the neck headaches upper extremities lower back and lower extremity regions. The patient is status post surgical intervention of the cervical region. The patient continues to have severe pain occurring in the neck radiating to the back of the hip precipitating severe headaches. We are awaiting insurance approval for patient to undergo greater occipital nerve blocks in attempt to decrease severity of headaches, minimize progression of symptoms, and avoid the need for more involved treatment. The patient denies any trauma change in events of daily living the call significant change in symptomatology.. The patient also states that she has numbness and tingling involving the hands. We informed patient once again that we would like for patient to follow-up with Dr. Pamala Hurry of [facility] and proceed with evaluation of hand by Dr.Grammig . The patient was with understanding and willingness to comply with recommendations. We will consider patient also for PNCV/EMG studies as discussed with patient for further assessment of the paresthesias of the upper extremities. There is concern regarding cervical radiculopathy as well as carpal tunnel syndrome as well as other etiologies which may be contributing to patient's symptomatology. We will proceed with greater occipital nerve block at time return appointment in attempt to decrease severity of headaches and patient will proceed with further evaluation of symptoms of the upper extremity as discussed. We will also consider interventional treatment of the cervical region for treatment of paresthesias of the upper extremity are consider local injections in the region of the upper extremity for treatment if such is felt to be necessary SUGGESTED  treatment plan       Review of Systems     Objective:   Physical Exam  There was tenderness to palpation of the paraspinal muscular region cervical region cervical facet region of moderate degree with moderately severe tenderness of the splenius capitis and occipitalis musculature regions. No bounding pulsations of the temporal region were noted. Palpation over the region of the thoracic facet thoracic paraspinal must reason was attends to palpation of moderate degree. No crepitus of the thoracic region was noted. Tinel and Phalen's maneuver was associated with mild to moderate discomfort. The patient was with decreased grip strength. Palpation over the lumbar paraspinal musculatures and lumbar facet region was attends to palpation of moderate degree. Lateral bending rotation extension and palpation of the lumbar facets reproduce moderate discomfort. There was mild tenderness of the PSIS and PII S region as well as the gluteal and piriformis musculature region. There was mild to moderate tenderness of the greater trochanteric region and iliotibial band region. Straight leg raising was tolerates approximately 30 without an increase of pain with dorsiflexion noted. DTRs were difficult to elicit. There was mild tenderness of the knees noted with negative anterior and posterior drawer signs without ballottement of the patella. Crepitus of the knees were noted. No definite sensory deficit or dermatomal dystrophy she was detected. There was negative clonus negative Homans. 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 with pain no disc bulging L4-L5 level with for left lateral disc component includes proximity to extraforaminal L5 nerve root and L5-S1 broad-based disc bulge with bilateral  facet arthropathy  Degenerative changes of the cervical  spine C5-C6 C6-7 and C7 1 degenerative changes with prior cervical discectomy and fusion  Sacroiliac joint dysfunction, sacroiliitis  Lumbar facet syndrome  Degenerative joint disease/tendinitis of hands       PLAN   Continue present medication Cymbalta Neurontin Zanaflex and methadone Take vitamin B complex since insurance will not approve Metanx  Greater occipital nerve block to be performed at time of return appointment. Ask the secretary if you have been approved by your insurance to have greater occipital nerve block  F/U PCP  Dr. Geoffry Paradise for evaliation of  BP and general medical  condition  F/U surgical evaluation. May consider pending follow-up evaluations Patient will follow-up with Dr.Haglund as needed   F/U neurological evaluation. May consider PNCV/EMG studies and other studies pending follow-up evaluations  Ask the secretary and nurses if you have been given appointment to see Dr. Veronia Beets for evaluation of your hand  May consider radiofrequency rhizolysis or intraspinal procedures pending response to present treatment and F/U evaluation   Patient to call [facility] should patient have concerns prior to scheduled return appointment.

## 2015-09-11 NOTE — Patient Instructions (Addendum)
PLAN   Continue present medication Cymbalta Neurontin Zanaflex and methadone Take vitamin B complex since insurance will not approve Metanx  Greater occipital nerve block to be performed at time of return appointment. Ask the secretary if you have been approved by your insurance to have greater occipital nerve block  F/U PCP  Dr. Geoffry Paradise for evaliation of  BP and general medical  condition  F/U surgical evaluation. May consider pending follow-up evaluations Patient will follow-up with Dr.Haglund as needed   F/U neurological evaluation. May consider PNCV/EMG studies and other studies pending follow-up evaluations  Ask the secretary and nurses if you have been given appointment to see Dr. Veronia Beets for evaluation of your hand  May consider radiofrequency rhizolysis or intraspinal procedures pending response to present treatment and F/U evaluation   Patient to call [facility] should patient have concerns prior to scheduled return appointment. Occipital Nerve Block Patient Information  Description: The occipital nerves originate in the cervical (neck) spinal cord and travel upward through muscle and tissue to supply sensation to the back of the head and top of the scalp.  In addition, the nerves control some of the muscles of the scalp.  Occipital neuralgia is an irritation of these nerves which can cause headaches, numbness of the scalp, and neck discomfort.     The occipital nerve block will interrupt nerve transmission through these nerves and can relieve pain and spasm.  The block consists of insertion of a small needle under the skin in the back of the head to deposit local anesthetic (numbing medicine) and/or steroids around the nerve.  The entire block usually lasts less than 5 minutes.  Conditions which may be treated by occipital blocks:   Muscular pain and spasm of the scalp  Nerve irritation, back of the head  Headaches  Upper neck pain  Preparation for the  injection:  1. Do not eat any solid food or dairy products within 8 hours of your appointment. 2. You may drink clear liquids up to 3 hours before appointment.  Clear liquids include water, black coffee, juice or soda.  No milk or cream please. 3. You may take your regular medication, including pain medications, with a sip of water before you appointment.  Diabetics should hold regular insulin (if taken separately) and take 1/2 normal NPH dose the morning of the procedure.  Carry some sugar containing items with you to your appointment. 4. A driver must accompany you and be prepared to drive you home after your procedure. 5. Bring all your current medications with you. 6. An IV may be inserted and sedation may be given at the discretion of the physician. 7. A blood pressure cuff, EKG, and other monitors will often be applied during the procedure.  Some patients may need to have extra oxygen administered for a short period. 8. You will be asked to provide medical information, including your allergies and medications, prior to the procedure.  We must know immediately if you are taking blood thinners (like Coumadin/Warfarin) or if you are allergic to IV iodine contrast (dye).  We must know if you could possible be pregnant.  9. Do not wear a high collared shirt or turtleneck.  Tie long hair up in the back if possible.  Possible side-effects:   Bleeding from needle site  Infection (rare, may require surgery)  Nerve injury (rare)  Hair on back of neck can be tinged with iodine scrub (this will wash out)  Light-headedness (temporary)  Pain at  injection site (several days)  Decreased blood pressure (rare, temporary)  Seizure (very rare)  Call if you experience:   Hives or difficulty breathing ( go to the emergency room)  Inflammation or drainage at the injection site(s)  Please note:  Although the local anesthetic injected can often make your painful muscles or headache feel good for  several hours after the injection, the pain may return.  It takes 3-7 days for steroids to work.  You may not notice any pain relief for at least one week.  If effective, we will often do a series of injections spaced 3-6 weeks apart to maximally decrease your pain.  If you have any questions, please call [phone] [facility]

## 2015-09-11 NOTE — Progress Notes (Signed)
Safety precautions to be maintained throughout the outpatient stay will include: orient to surroundings, keep bed in low position, maintain call bell within reach at all times, provide assistance with transfer out of bed and ambulation.  

## 2015-10-06 ENCOUNTER — Encounter: Payer: Self-pay | Admitting: Pain Medicine

## 2015-10-06 ENCOUNTER — Ambulatory Visit: Payer: Managed Care, Other (non HMO) | Attending: Pain Medicine | Admitting: Pain Medicine

## 2015-10-06 VITALS — BP 159/90 | HR 71 | Temp 97.8°F | Resp 39 | Ht 70.0 in | Wt 175.0 lb

## 2015-10-06 DIAGNOSIS — M5136 Other intervertebral disc degeneration, lumbar region: Secondary | ICD-10-CM

## 2015-10-06 DIAGNOSIS — M533 Sacrococcygeal disorders, not elsewhere classified: Secondary | ICD-10-CM

## 2015-10-06 DIAGNOSIS — M7062 Trochanteric bursitis, left hip: Secondary | ICD-10-CM

## 2015-10-06 DIAGNOSIS — G609 Hereditary and idiopathic neuropathy, unspecified: Secondary | ICD-10-CM

## 2015-10-06 DIAGNOSIS — M542 Cervicalgia: Secondary | ICD-10-CM | POA: Diagnosis not present

## 2015-10-06 DIAGNOSIS — M19042 Primary osteoarthritis, left hand: Secondary | ICD-10-CM

## 2015-10-06 DIAGNOSIS — M5030 Other cervical disc degeneration, unspecified cervical region: Secondary | ICD-10-CM

## 2015-10-06 DIAGNOSIS — M47812 Spondylosis without myelopathy or radiculopathy, cervical region: Secondary | ICD-10-CM | POA: Diagnosis not present

## 2015-10-06 DIAGNOSIS — M47817 Spondylosis without myelopathy or radiculopathy, lumbosacral region: Secondary | ICD-10-CM

## 2015-10-06 DIAGNOSIS — Z981 Arthrodesis status: Secondary | ICD-10-CM | POA: Insufficient documentation

## 2015-10-06 DIAGNOSIS — M5010 Cervical disc disorder with radiculopathy, unspecified cervical region: Secondary | ICD-10-CM

## 2015-10-06 DIAGNOSIS — M5481 Occipital neuralgia: Secondary | ICD-10-CM

## 2015-10-06 DIAGNOSIS — M48061 Spinal stenosis, lumbar region without neurogenic claudication: Secondary | ICD-10-CM

## 2015-10-06 DIAGNOSIS — M19041 Primary osteoarthritis, right hand: Secondary | ICD-10-CM

## 2015-10-06 DIAGNOSIS — G90529 Complex regional pain syndrome I of unspecified lower limb: Secondary | ICD-10-CM

## 2015-10-06 DIAGNOSIS — G5603 Carpal tunnel syndrome, bilateral upper limbs: Secondary | ICD-10-CM

## 2015-10-06 DIAGNOSIS — M7022 Olecranon bursitis, left elbow: Secondary | ICD-10-CM

## 2015-10-06 DIAGNOSIS — M7061 Trochanteric bursitis, right hip: Secondary | ICD-10-CM

## 2015-10-06 DIAGNOSIS — R51 Headache: Secondary | ICD-10-CM | POA: Diagnosis present

## 2015-10-06 MED ORDER — BUPIVACAINE HCL (PF) 0.25 % IJ SOLN
INTRAMUSCULAR | Status: AC
  Administered 2015-10-06: 13:00:00
  Filled 2015-10-06: qty 30

## 2015-10-06 MED ORDER — FENTANYL CITRATE (PF) 100 MCG/2ML IJ SOLN: 100.0000 ug | Freq: Once | INTRAMUSCULAR | Status: DC

## 2015-10-06 MED ORDER — ORPHENADRINE CITRATE 30 MG/ML IJ SOLN
INTRAMUSCULAR | Status: AC
  Administered 2015-10-06: 13:00:00
  Filled 2015-10-06: qty 2

## 2015-10-06 MED ORDER — MIDAZOLAM HCL 5 MG/5ML IJ SOLN: 5.0000 mg | Freq: Once | INTRAMUSCULAR | Status: DC

## 2015-10-06 MED ORDER — TRIAMCINOLONE ACETONIDE 40 MG/ML IJ SUSP
INTRAMUSCULAR | Status: AC
  Administered 2015-10-06: 13:00:00
  Filled 2015-10-06: qty 1

## 2015-10-06 MED ORDER — LACTATED RINGERS IV SOLN: 1000.0000 mL | INTRAVENOUS | Status: DC

## 2015-10-06 MED ORDER — ORPHENADRINE CITRATE 30 MG/ML IJ SOLN: 60.0000 mg | Freq: Once | INTRAMUSCULAR | Status: DC

## 2015-10-06 MED ORDER — BUPIVACAINE HCL (PF) 0.25 % IJ SOLN
30.0000 mL | Freq: Once | INTRAMUSCULAR | Status: DC
Start: 2015-10-06 — End: 2023-11-22

## 2015-10-06 MED ORDER — TRIAMCINOLONE ACETONIDE 40 MG/ML IJ SUSP: 40.0000 mg | Freq: Once | INTRAMUSCULAR | Status: DC

## 2015-10-06 MED ORDER — FENTANYL CITRATE (PF) 100 MCG/2ML IJ SOLN
INTRAMUSCULAR | Status: AC
  Administered 2015-10-06: 100 ug via INTRAVENOUS
  Filled 2015-10-06: qty 2

## 2015-10-06 MED ORDER — MIDAZOLAM HCL 5 MG/5ML IJ SOLN
INTRAMUSCULAR | Status: AC
  Administered 2015-10-06: 3 mg via INTRAVENOUS
  Filled 2015-10-06: qty 5

## 2015-10-06 NOTE — Patient Instructions (Addendum)
PLAN   Continue present medication Cymbalta Neurontin Zanaflex and methadone Take vitamin B complex since insurance will not approve Metanx  F/U PCP  Dr. Geoffry Paradise for evaliation of  BP and general medical  condition  F/U surgical evaluation. May consider pending follow-up evaluations Patient will follow-up with Dr.Haglund as needed   F/U neurological evaluation. May consider PNCV/EMG studies and other studies pending follow-up evaluations  Ask the secretary and nurses if you have been given appointment to see Dr. Veronia Beets for evaluation of your hand as previously discussed  May consider radiofrequency rhizolysis or intraspinal procedures pending response to present treatment and F/U evaluation   Patient to call [facility] should patient have concerns prior to scheduled return appointment. GENERAL RISKS AND COMPLICATIONS  What are the risk, side effects and possible complications? Generally speaking, most procedures are safe.  However, with any procedure there are risks, side effects, and the possibility of complications.  The risks and complications are dependent upon the sites that are lesioned, or the type of nerve block to be performed.  The closer the procedure is to the spine, the more serious the risks are.  Great care is taken when placing the radio frequency needles, block needles or lesioning probes, but sometimes complications can occur. 1. Infection: Any time there is an injection through the skin, there is a risk of infection.  This is why sterile conditions are used for these blocks.  There are four possible types of infection. 1. Localized skin infection. 2. Central Nervous System Infection-This can be in the form of Meningitis, which can be deadly. 3. Epidural Infections-This can be in the form of an epidural abscess, which can cause pressure inside of the spine, causing compression of the spinal cord with subsequent paralysis. This would require an emergency surgery  to decompress, and there are no guarantees that the patient would recover from the paralysis. 4. Discitis-This is an infection of the intervertebral discs.  It occurs in about 1% of discography procedures.  It is difficult to treat and it may lead to surgery.        2. Pain: the needles have to go through skin and soft tissues, will cause soreness.       3. Damage to internal structures:  The nerves to be lesioned may be near blood vessels or    other nerves which can be potentially damaged.       4. Bleeding: Bleeding is more common if the patient is taking blood thinners such as  aspirin, Coumadin, Ticiid, Plavix, etc., or if he/she have some genetic predisposition  such as hemophilia. Bleeding into the spinal canal can cause compression of the spinal  cord with subsequent paralysis.  This would require an emergency surgery to  decompress and there are no guarantees that the patient would recover from the  paralysis.       5. Pneumothorax:  Puncturing of a lung is a possibility, every time a needle is introduced in  the area of the chest or upper back.  Pneumothorax refers to free air around the  collapsed lung(s), inside of the thoracic cavity (chest cavity).  Another two possible  complications related to a similar event would include: Hemothorax and Chylothorax.   These are variations of the Pneumothorax, where instead of air around the collapsed  lung(s), you may have blood or chyle, respectively.       6. Spinal headaches: They may occur with any procedures in the area of the spine.  7. Persistent CSF (Cerebro-Spinal Fluid) leakage: This is a rare problem, but may occur  with prolonged intrathecal or epidural catheters either due to the formation of a fistulous  track or a dural tear.       8. Nerve damage: By working so close to the spinal cord, there is always a possibility of  nerve damage, which could be as serious as a permanent spinal cord injury with  paralysis.       9. Death:   Although rare, severe deadly allergic reactions known as "Anaphylactic  reaction" can occur to any of the medications used.      10. Worsening of the symptoms:  We can always make thing worse.  What are the chances of something like this happening? Chances of any of this occuring are extremely low.  By statistics, you have more of a chance of getting killed in a motor vehicle accident: while driving to the hospital than any of the above occurring .  Nevertheless, you should be aware that they are possibilities.  In general, it is similar to taking a shower.  Everybody knows that you can slip, hit your head and get killed.  Does that mean that you should not shower again?  Nevertheless always keep in mind that statistics do not mean anything if you happen to be on the wrong side of them.  Even if a procedure has a 1 (one) in a 1,000,000 (million) chance of going wrong, it you happen to be that one..Also, keep in mind that by statistics, you have more of a chance of having something go wrong when taking medications.  Who should not have this procedure? If you are on a blood thinning medication (e.g. Coumadin, Plavix, see list of "Blood Thinners"), or if you have an active infection going on, you should not have the procedure.  If you are taking any blood thinners, please inform your physician.  How should I prepare for this procedure?  Do not eat or drink anything at least six hours prior to the procedure.  Bring a driver with you .  It cannot be a taxi.  Come accompanied by an adult that can drive you back, and that is strong enough to help you if your legs get weak or numb from the local anesthetic.  Take all of your medicines the morning of the procedure with just enough water to swallow them.  If you have diabetes, make sure that you are scheduled to have your procedure done first thing in the morning, whenever possible.  If you have diabetes, take only half of your insulin dose and notify our  nurse that you have done so as soon as you arrive at the clinic.  If you are diabetic, but only take blood sugar pills (oral hypoglycemic), then do not take them on the morning of your procedure.  You may take them after you have had the procedure.  Do not take aspirin or any aspirin-containing medications, at least eleven (11) days prior to the procedure.  They may prolong bleeding.  Wear loose fitting clothing that may be easy to take off and that you would not mind if it got stained with Betadine or blood.  Do not wear any jewelry or perfume  Remove any nail coloring.  It will interfere with some of our monitoring equipment.  NOTE: Remember that this is not meant to be interpreted as a complete list of all possible complications.  Unforeseen problems may occur.  BLOOD THINNERS  The following drugs contain aspirin or other products, which can cause increased bleeding during surgery and should not be taken for 2 weeks prior to and 1 week after surgery.  If you should need take something for relief of minor pain, you may take acetaminophen which is found in Tylenol,m Datril, Anacin-3 and Panadol. It is not blood thinner. The products listed below are.  Do not take any of the products listed below in addition to any listed on your instruction sheet.  A.P.C or A.P.C with Codeine Codeine Phosphate Capsules #3 Ibuprofen Ridaura  ABC compound Congesprin Imuran rimadil  Advil Cope Indocin Robaxisal  Alka-Seltzer Effervescent Pain Reliever and Antacid Coricidin or Coricidin-D  Indomethacin Rufen  Alka-Seltzer plus Cold Medicine Cosprin Ketoprofen S-A-C Tablets  Anacin Analgesic Tablets or Capsules Coumadin Korlgesic Salflex  Anacin Extra Strength Analgesic tablets or capsules CP-2 Tablets Lanoril Salicylate  Anaprox Cuprimine Capsules Levenox Salocol  Anexsia-D Dalteparin Magan Salsalate  Anodynos Darvon compound Magnesium Salicylate Sine-off  Ansaid Dasin Capsules Magsal Sodium Salicylate   Anturane Depen Capsules Marnal Soma  APF Arthritis pain formula Dewitt's Pills Measurin Stanback  Argesic Dia-Gesic Meclofenamic Sulfinpyrazone  Arthritis Bayer Timed Release Aspirin Diclofenac Meclomen Sulindac  Arthritis pain formula Anacin Dicumarol Medipren Supac  Analgesic (Safety coated) Arthralgen Diffunasal Mefanamic Suprofen  Arthritis Strength Bufferin Dihydrocodeine Mepro Compound Suprol  Arthropan liquid Dopirydamole Methcarbomol with Aspirin Synalgos  ASA tablets/Enseals Disalcid Micrainin Tagament  Ascriptin Doan's Midol Talwin  Ascriptin A/D Dolene Mobidin Tanderil  Ascriptin Extra Strength Dolobid Moblgesic Ticlid  Ascriptin with Codeine Doloprin or Doloprin with Codeine Momentum Tolectin  Asperbuf Duoprin Mono-gesic Trendar  Aspergum Duradyne Motrin or Motrin IB Triminicin  Aspirin plain, buffered or enteric coated Durasal Myochrisine Trigesic  Aspirin Suppositories Easprin Nalfon Trillsate  Aspirin with Codeine Ecotrin Regular or Extra Strength Naprosyn Uracel  Atromid-S Efficin Naproxen Ursinus  Auranofin Capsules Elmiron Neocylate Vanquish  Axotal Emagrin Norgesic Verin  Azathioprine Empirin or Empirin with Codeine Normiflo Vitamin E  Azolid Emprazil Nuprin Voltaren  Bayer Aspirin plain, buffered or children's or timed BC Tablets or powders Encaprin Orgaran Warfarin Sodium  Buff-a-Comp Enoxaparin Orudis Zorpin  Buff-a-Comp with Codeine Equegesic Os-Cal-Gesic   Buffaprin Excedrin plain, buffered or Extra Strength Oxalid   Bufferin Arthritis Strength Feldene Oxphenbutazone   Bufferin plain or Extra Strength Feldene Capsules Oxycodone with Aspirin   Bufferin with Codeine Fenoprofen Fenoprofen Pabalate or Pabalate-SF   Buffets II Flogesic Panagesic   Buffinol plain or Extra Strength Florinal or Florinal with Codeine Panwarfarin   Buf-Tabs Flurbiprofen Penicillamine   Butalbital Compound Four-way cold tablets Penicillin   Butazolidin Fragmin Pepto-Bismol    Carbenicillin Geminisyn Percodan   Carna Arthritis Reliever Geopen Persantine   Carprofen Gold's salt Persistin   Chloramphenicol Goody's Phenylbutazone   Chloromycetin Haltrain Piroxlcam   Clmetidine heparin Plaquenil   Cllnoril Hyco-pap Ponstel   Clofibrate Hydroxy chloroquine Propoxyphen         Before stopping any of these medications, be sure to consult the physician who ordered them.  Some, such as Coumadin (Warfarin) are ordered to prevent or treat serious conditions such as "deep thrombosis", "pumonary embolisms", and other heart problems.  The amount of time that you may need off of the medication may also vary with the medication and the reason for which you were taking it.  If you are taking any of these medications, please make sure you notify your pain physician before you undergo any procedures.

## 2015-10-06 NOTE — Progress Notes (Signed)
   Subjective:    Patient ID: Jamie Kim, female    DOB: [birth year], 55 y.o.   MRN: [id number]  HPI  NOTE: The patient is a 55 y.o.-year-old female who returns to the [facility] for further evaluation and treatment of pain consisting of pain involving the region of the neck and headache.  Patient is with prior studies revealing patient to be with degenerative changes of the cervical spine C5-C6 C6-7 and C7 1 degenerative changes with prior cervical discectomy and fusion . The patient is with headaches radiating from the splenius capitis and occipitalis region continuing forward to the retro-orbital region. There is concern regarding component of headaches been due to greater occipital neuralgia as well as component of migraine headache. With the bilateral occipital neuralgia appeared to be more prevalent The risks, benefits, and expectations of the procedure have been discussed and explained to patient, who is understanding and wishes to proceed with interventional treatment as discussed and as explained to patient.  Will proceed with greater occipital nerve blocks with myoneural block injections at this time as discussed and as explained to patient.  All are understanding and in agreement with suggested treatment plan.    PROCEDURE:  Greater occipital nerve block on the left side with IV Versed, IV Fentanyl, conscious sedation, EKG, blood pressure, pulse, pulse oximetry monitoring.  Procedure performed with patient in prone position.  Greater occipital nerve block on the left side.   With patient in prone position, Betadine prep of proposed entry site accomplished.  Following identification of the nuchal ridge, 22 -gauge needle was inserted at the level of the nuchal ridge medial to the occipital artery.  Following negative aspiration, 4cc 0.25% bupivacaine with Kenalog injected for left greater occipital nerve block.  Needle was removed.  Patient tolerated injection  well.   Greater occipital nerve block on the rightt side. The greater occipital nerve block on the right side was performed exactly as the left greater occipital nerve block was performed and utilizing the same technique.  Myoneural block injections of the thoracic musculature region. Following Betadine prep of proposed entry site a 22-gauge needle was inserted in the thoracic musculature region and following negative aspiration 2 cc of 0.25% bupivacaine with Norflex was injected for myoneural block injection of the thoracic region times four   The patient tolerated procedure well    A total of 10 mg Kenalog was utilized for the entire procedure.  PLAN:    1. Medications: Will continue presently prescribed medications Cymbalta Neurontin Zanaflex and methadone at this time. 2. Patient to follow up with primary care physician Dr. Geoffry Paradise for evaluation of blood pressure and general medical condition status post procedure performed on today's visit. 3. Neurological evaluation for further assessment of headaches for further studies as discussed. 4. Surgical evaluation as discussed. Patient will follow-up with Dr. Pamala Hurry in this regard 5. Patient may be candidate for Botox injections, radiofrequency procedures, as well as implantation type procedures pending response to treatment rendered on today's visit and pending follow-up evaluation. 6. Patient has been advised to adhere to proper body mechanics and to avoid activities which appear to aggravate condition.cations:  Will continue presently prescribed medications at this time. 7. The patient is understanding and in agreement with the suggested treatment plan.   Review of Systems     Objective:   Physical Exam        Assessment & Plan:

## 2015-10-06 NOTE — Progress Notes (Signed)
Patient here for procedure d/t neck pain.  Is scheduled for medication refill on 10/09/15 and would like refills today. Safety precautions to be maintained throughout the outpatient stay will include: orient to surroundings, keep bed in low position, maintain call bell within reach at all times, provide assistance with transfer out of bed and ambulation.

## 2015-10-07 ENCOUNTER — Telehealth: Payer: Self-pay | Admitting: *Deleted

## 2015-10-07 NOTE — Telephone Encounter (Signed)
Left voicemail for patient to call our office if there are questions or concerns re; procedure on yesterday.  

## 2015-10-08 ENCOUNTER — Encounter: Payer: Self-pay | Admitting: Physical Therapy

## 2015-10-08 ENCOUNTER — Ambulatory Visit: Payer: Managed Care, Other (non HMO) | Attending: Pain Medicine | Admitting: Physical Therapy

## 2015-10-08 DIAGNOSIS — M545 Low back pain, unspecified: Secondary | ICD-10-CM

## 2015-10-08 NOTE — Therapy (Signed)
[place] [facility] SERVICES [place], [place], [address] Phone: [phone]   Fax:  [phone]  Physical Therapy Evaluation  Patient Details  Name: Jamie Kim MRN: [id number] Date of Birth: [birth year] No Data Recorded  Encounter Date: 10/08/2015      PT End of Session - 10/08/15 1615    Visit Number 1   PT Start Time 0400   PT Stop Time 0445   PT Time Calculation (min) 45 min      Past Medical History  Diagnosis Date  . GERD (gastroesophageal reflux disease)   . Fibromyalgia   . H/O scarlet fever   . Heart murmur     hx  . Hypertension   . Multiple thyroid nodules   . DDD (degenerative disc disease), cervical     lumbar  . Hyperlipidemia   . Migraines   . Sleep apnea   . Back pain, chronic   . Fibromyalgia   . Allergy     Past Surgical History  Procedure Laterality Date  . Tonsillectomy    . Laparoscopy    . Total abdominal hysterectomy    . Cholecystectomy    . Carpal tunnel release Bilateral   . Nissen fundoplication    . Anterior cervical decomp/discectomy fusion    . Hernia repair    . Bladder suspension    . Knee surgery Left   . Cardiac catheterization  11/23/12    [facility];EF 65%  . Neck surgery      There were no vitals filed for this visit.  Visit Diagnosis:  Midline low back pain without sciatica      Subjective Assessment - 10/08/15 1608    Subjective Patient has neck , shoulder and low back pain.                  Tens Evaluation [facility]) - 10/08/15 1609    TENS History   History of Current Condition Patient has had neck surgery and shoulder pain and back pain 2004   Treatments used to date Medication;Injections;Surgery;Cryo/Thermotherapy   ROM Impairments and Functional Limitations WFL   ROM WFL   Strength Impairments and Functional Limitations WFL   Strength WFL   Sensation Impairments and Fuctional Limitations WFL   Sensation tingling in finger tips and numb  finger tips   Contraindications/Precautions Impaired sensation   Assessment and Plan   Unit and Parameters constant wave length   Assessment/ Plan Patient is able to demonstrate proper setup and use of TENS unit, no further skilled needs.   Initial Parameters used constant , burst   Type/Brand of TENS issued: medical modalities   Frequency and duraton of daily use: 2 hours on 2 hours off when in pain                      PT Education - 10/08/15 1614    Education provided Yes   Education Details TENS   Person(s) Educated Patient   Methods Explanation   Comprehension Verbalized understanding             PT Long Term Goals - 10/08/15 1617    PT LONG TERM GOAL #1   Title Patient will understand how to use the TENS unit for home use.    Status Achieved               Plan - 10/08/15 1616    Clinical Impression Statement Patient has chronic pain in neck,  shoulders and low back and is here for a TENS unit for pain.    PT Frequency One time visit         Problem List Patient Active Problem List   Diagnosis Date Noted  . DDD (degenerative disc disease), lumbar 02/26/2015  . Bilateral occipital neuralgia 01/27/2015  . Spinal stenosis of lumbar region 11/25/2014  . DDD (degenerative disc disease), cervical 11/24/2014  . Status post cervical spinal fusion 11/24/2014  . Lumbosacral facet joint syndrome 11/24/2014  . Sacroiliac joint dysfunction of both sides 11/24/2014  . Greater trochanteric bursitis of both hips 11/24/2014  . Cervical facet syndrome 11/24/2014  . Hyperlipidemia   . Chest pain 10/26/2012  . Palpitations 10/26/2012   Alanson Puls, PT, DPT [place], [place] S 10/08/2015, 4:22 PM  [place] [facility] SERVICES [address] [place], [place], [address] Phone: [phone]   Fax:  [phone]  Name: Jamie Kim MRN: [id number] Date of Birth: 1961/05/13

## 2015-10-09 ENCOUNTER — Ambulatory Visit: Payer: Medicare Other | Admitting: Pain Medicine

## 2015-10-15 ENCOUNTER — Encounter: Payer: Self-pay | Admitting: Pain Medicine

## 2015-10-15 ENCOUNTER — Ambulatory Visit: Payer: Managed Care, Other (non HMO) | Attending: Pain Medicine | Admitting: Pain Medicine

## 2015-10-15 VITALS — BP 127/99 | HR 66 | Temp 97.5°F | Resp 16 | Ht 70.0 in | Wt 175.0 lb

## 2015-10-15 DIAGNOSIS — M19042 Primary osteoarthritis, left hand: Secondary | ICD-10-CM | POA: Diagnosis not present

## 2015-10-15 DIAGNOSIS — M461 Sacroiliitis, not elsewhere classified: Secondary | ICD-10-CM | POA: Diagnosis not present

## 2015-10-15 DIAGNOSIS — M5010 Cervical disc disorder with radiculopathy, unspecified cervical region: Secondary | ICD-10-CM

## 2015-10-15 DIAGNOSIS — M545 Low back pain: Secondary | ICD-10-CM | POA: Diagnosis present

## 2015-10-15 DIAGNOSIS — M5481 Occipital neuralgia: Secondary | ICD-10-CM | POA: Insufficient documentation

## 2015-10-15 DIAGNOSIS — M5126 Other intervertebral disc displacement, lumbar region: Secondary | ICD-10-CM | POA: Insufficient documentation

## 2015-10-15 DIAGNOSIS — M47812 Spondylosis without myelopathy or radiculopathy, cervical region: Secondary | ICD-10-CM

## 2015-10-15 DIAGNOSIS — R51 Headache: Secondary | ICD-10-CM | POA: Insufficient documentation

## 2015-10-15 DIAGNOSIS — G5603 Carpal tunnel syndrome, bilateral upper limbs: Secondary | ICD-10-CM

## 2015-10-15 DIAGNOSIS — M5136 Other intervertebral disc degeneration, lumbar region: Secondary | ICD-10-CM

## 2015-10-15 DIAGNOSIS — M19041 Primary osteoarthritis, right hand: Secondary | ICD-10-CM | POA: Insufficient documentation

## 2015-10-15 DIAGNOSIS — Z9889 Other specified postprocedural states: Secondary | ICD-10-CM | POA: Diagnosis not present

## 2015-10-15 DIAGNOSIS — Z981 Arthrodesis status: Secondary | ICD-10-CM | POA: Diagnosis not present

## 2015-10-15 DIAGNOSIS — M533 Sacrococcygeal disorders, not elsewhere classified: Secondary | ICD-10-CM | POA: Diagnosis not present

## 2015-10-15 DIAGNOSIS — M47816 Spondylosis without myelopathy or radiculopathy, lumbar region: Secondary | ICD-10-CM | POA: Diagnosis not present

## 2015-10-15 DIAGNOSIS — M1288 Other specific arthropathies, not elsewhere classified, other specified site: Secondary | ICD-10-CM | POA: Diagnosis not present

## 2015-10-15 DIAGNOSIS — M7062 Trochanteric bursitis, left hip: Secondary | ICD-10-CM

## 2015-10-15 DIAGNOSIS — G90529 Complex regional pain syndrome I of unspecified lower limb: Secondary | ICD-10-CM

## 2015-10-15 DIAGNOSIS — M7061 Trochanteric bursitis, right hip: Secondary | ICD-10-CM

## 2015-10-15 DIAGNOSIS — M47817 Spondylosis without myelopathy or radiculopathy, lumbosacral region: Secondary | ICD-10-CM

## 2015-10-15 DIAGNOSIS — M7022 Olecranon bursitis, left elbow: Secondary | ICD-10-CM

## 2015-10-15 DIAGNOSIS — M542 Cervicalgia: Secondary | ICD-10-CM | POA: Diagnosis present

## 2015-10-15 DIAGNOSIS — G609 Hereditary and idiopathic neuropathy, unspecified: Secondary | ICD-10-CM

## 2015-10-15 MED ORDER — METHADONE HCL 5 MG PO TABS: ORAL_TABLET | ORAL | Status: DC

## 2015-10-15 NOTE — Progress Notes (Signed)
Subjective:    Patient ID: Jamie Kim, female    DOB: August 14, 1960, 55 y.o.   MRN: [id number]  HPI  The patient is a 55 year old female who returns to pain management for further evaluation and treatment of pain involving the neck associated with headaches as well as pain involving the upper mid lower back and lower extremity regions. The patient is status post prior surgical intervention of the cervical region. The patient has difficulty with pain involving the upper extremities and has had difficulty handling objects. The patient is undergone evaluation by Dr. Pamala Hurry of [facility] and is without recommendations for additional surgery of the cervical region. At the present time we are awaiting patient to have appointment for evaluation with Dr.Grammig for evaluation of patient's hands which have been with significant pain. The patient is a pain involving the mid and lower back regions of moderate degree with some lower extremity pain is well. At the present time we will avoid interventional treatment and we'll continue medications consisting of Cymbalta Neurontin Zanaflex and methadone as well as vitamin B complex. The patient is without any trauma change in events of daily living the call significant change in symptomatology. The patient is in agreement with suggested treatment plan.      Review of Systems     Objective:   Physical Exam  There was tenderness over the splenius capitis and occipitalis musculature region a moderate degree. There was moderate tenderness over the cervical facet cervical paraspinal musculature region. There was well-healed surgical scar of the cervical region without increased warmth and erythema in the region of the scar. Palpation over the region of the acromioclavicular and glenohumeral joint regions were attends to palpation of mild degree with a shunt being with unremarkable Spurling's maneuver. The patient appeared to be with decreased grip  strength without increased warmth erythema of the hands with tenderness to palpation of the hands noted was questionable swelling of the hands with no increased warmth and erythema noted of the hands there was mild tenderness to palpation of the medial and lateral epicondyles of the elbows. Palpation over the thoracic facet thoracic paraspinal must reason was attends to palpation with crepitus of the thoracic region noted. Palpation over the lumbar paraspinal musculatures and lumbar facet region was tenderness to palpation with lateral bending rotation and extension and palpation over the lumbar facets reproducing moderate discomfort. There was moderate tenderness over the PSIS and PII S region as well as. Palpation of the greater trochanteric region iliotibial band region was with mild discomfort to moderate discomfort with moderate tenderness along the iliotibial band. Leg raising was tolerated to 30 without an increase of pain with dorsiflexion noted. EHL strength appeared to be slightly decreased with out a sensory deficit or dermatomal dystrophy detected. There was negative clonus negative Homans. Abdomen nontender and no costovertebral tenderness noted      Assessment & Plan:    Bilateral occipital neuralgia  Degenerative disc disease lumbar spine L3-4 and L4-5 degenerative changes with pain no disc bulging L4-L5 level with for left lateral disc component includes proximity to extraforaminal L5 nerve root and L5-S1 broad-based disc bulge with bilateral facet arthropathy  Degenerative changes of the cervical spine C5-C6 C6-7 and C7 1 degenerative changes with prior cervical discectomy and fusion  Sacroiliac joint dysfunction, sacroiliitis  Lumbar facet syndrome  Degenerative joint disease/tendinitis of hands      PLAN   Continue present medication Cymbalta Neurontin Zanaflex and methadone Take vitamin B complex since insurance  will not approve Metanx  F/U PCP  Dr. Geoffry Paradise for  evaliation of  BP and general medical  condition. Please follow-up with Dr. Lisbeth Ply regarding blood pressure  F/U surgical evaluation. May consider pending follow-up evaluations Patient will follow-up with Dr.Haglund as needed   F/U neurological evaluation. May consider PNCV/EMG studies and other studies pending follow-up evaluations  Ask the secretary and nurses if you have been given appointment to see Dr. Veronia Beets for evaluation of your hand  Rheumatological evaluation to be considered for further assessment of her hands and for general evaluation of patient's arthralgias and myalgias  May consider radiofrequency rhizolysis or intraspinal procedures pending response to present treatment and F/U evaluation   Patient to call Pain Management Center should patient have concerns prior to scheduled return appointment.

## 2015-10-15 NOTE — Patient Instructions (Addendum)
PLAN   Continue present medication Cymbalta Neurontin Zanaflex and methadone Take vitamin B complex since insurance will not approve Metanx  F/U PCP  Dr. Geoffry Paradise for evaliation of  BP and general medical  condition. Please follow-up with Dr. Lisbeth Ply regarding blood pressure  F/U surgical evaluation. May consider pending follow-up evaluations Patient will follow-up with Dr.Haglund as needed   F/U neurological evaluation. May consider PNCV/EMG studies and other studies pending follow-up evaluations  Ask the secretary and nurses if you have been given appointment to see Dr. Veronia Beets for evaluation of your hand  May consider radiofrequency rhizolysis or intraspinal procedures pending response to present treatment and F/U evaluation   Patient to call [facility] should patient have concerns prior to scheduled return appointment.

## 2015-10-15 NOTE — Progress Notes (Signed)
Patient here for medication management Safety precautions to be maintained throughout the outpatient stay will include: orient to surroundings, keep bed in low position, maintain call bell within reach at all times, provide assistance with transfer out of bed and ambulation.  

## 2015-10-22 ENCOUNTER — Ambulatory Visit: Payer: Medicare Other | Admitting: Pain Medicine

## 2015-10-30 ENCOUNTER — Ambulatory Visit (INDEPENDENT_AMBULATORY_CARE_PROVIDER_SITE_OTHER): Payer: Managed Care, Other (non HMO) | Admitting: Obstetrics and Gynecology

## 2015-10-30 ENCOUNTER — Encounter: Payer: Self-pay | Admitting: Obstetrics and Gynecology

## 2015-10-30 VITALS — BP 112/78 | HR 69 | Ht 70.5 in | Wt 200.6 lb

## 2015-10-30 DIAGNOSIS — Z01419 Encounter for gynecological examination (general) (routine) without abnormal findings: Secondary | ICD-10-CM

## 2015-10-30 MED ORDER — TOLTERODINE TARTRATE ER 4 MG PO CP24: 4.0000 mg | ORAL_CAPSULE | Freq: Every day | ORAL | Status: DC

## 2015-10-30 NOTE — Patient Instructions (Signed)
Place annual gynecologic exam patient instructions here.

## 2015-10-30 NOTE — Progress Notes (Signed)
Subjective:   Jamie Kim is a 55 y.o. No obstetric history on file. Caucasian female here for a routine well-woman exam.  No LMP recorded. Patient has had a hysterectomy.    Current complaints: occasional vaginal dryness PCP: [facility] ([place])       doesn't desire labs  Social History: Sexual: heterosexual Marital Status: married Living situation: with spouse Occupation: unknown occupation Tobacco/alcohol: no alcohol use Illicit drugs: no history of illicit drug use  The following portions of the patient's history were reviewed and updated as appropriate: allergies, current medications, past family history, past medical history, past social history, past surgical history and problem list.  Past Medical History Past Medical History  Diagnosis Date  . GERD (gastroesophageal reflux disease)   . Fibromyalgia   . H/O scarlet fever   . Heart murmur     hx  . Hypertension   . Multiple thyroid nodules   . DDD (degenerative disc disease), cervical     lumbar  . Hyperlipidemia   . Migraines   . Sleep apnea   . Back pain, chronic   . Fibromyalgia   . Allergy     Past Surgical History Past Surgical History  Procedure Laterality Date  . Tonsillectomy    . Laparoscopy    . Total abdominal hysterectomy    . Cholecystectomy    . Carpal tunnel release Bilateral   . Nissen fundoplication    . Anterior cervical decomp/discectomy fusion    . Hernia repair    . Bladder suspension    . Knee surgery Left   . Cardiac catheterization  11/23/12    [facility];EF 65%  . Neck surgery      Gynecologic History No obstetric history on file.  No LMP recorded. Patient has had a hysterectomy. Contraception: status post hysterectomy Last Pap: 2015. Results were: normal Last mammogram: 2016. Results were: abnormal-fibrocystic-f/u u/s normal  Obstetric History OB History  No data available    Current Medications Current Outpatient Prescriptions on File Prior to Visit  Medication  Sig Dispense Refill  . buPROPion (WELLBUTRIN SR) 150 MG 12 hr tablet Take 150 mg by mouth daily.    . busPIRone (BUSPAR) 30 MG tablet Take 30 mg by mouth 2 (two) times daily.    . clindamycin (CLEOCIN T) 1 % external solution Apply 1 application topically 2 (two) times daily. Reported on 08/18/2015    . DULoxetine (CYMBALTA) 60 MG capsule Limit 1-2 capsules per day if tolerated 60 capsule 2  . ergocalciferol (VITAMIN D2) 50000 UNITS capsule Take 50,000 Units by mouth once a week.    . esomeprazole (NEXIUM) 40 MG capsule Take 40 mg by mouth 2 (two) times daily.    Marland Kitchen ezetimibe (ZETIA) 10 MG tablet Take 10 mg by mouth daily. Reported on 10/15/2015    . gabapentin (NEURONTIN) 600 MG tablet Limit 1 tablet by mouth twice per day if tolerated 60 tablet 2  . multivitamin (METANX) 3-35-2 MG TABS tablet Limit 1-2 tabs by mouth per day if tolerated 60 tablet 0  . mupirocin ointment (BACTROBAN) 2 % Apply 1 application topically daily. Reported on 10/15/2015    . promethazine (PHENERGAN) 25 MG tablet Take 25 mg by mouth every 6 (six) hours as needed for nausea.    . simvastatin (ZOCOR) 80 MG tablet Take 80 mg by mouth at bedtime.    Marland Kitchen tiZANidine (ZANAFLEX) 2 MG tablet Limit  2-4 tablets by mouth per day tolerated 110 tablet 2  . topiramate (TOPAMAX) 50  MG tablet Take 50 mg by mouth 2 (two) times daily.    Marland Kitchen tretinoin (RETIN-A) 0.025 % cream Apply 1 application topically at bedtime.    . ciprofloxacin (CIPRO) 250 MG tablet Take 1 tablet (250 mg total) by mouth 2 (two) times daily. (Patient not taking: Reported on 06/25/2015) 14 tablet 0  . ciprofloxacin (CIPRO) 250 MG tablet Take 1 tablet (250 mg total) by mouth 2 (two) times daily. (Patient not taking: Reported on 05/08/2015) 14 tablet 0  . erythromycin (ERY-TAB) 500 MG EC tablet Take 500 mg by mouth 2 (two) times daily. Reported on 10/30/2015    . estrogens, conjugated, (PREMARIN) 0.3 MG tablet Take 0.3 mg by mouth at bedtime. Reported on 10/30/2015    .  hydrocortisone 2.5 % cream Apply topically 2 (two) times daily. Reported on 10/30/2015    . ketoconazole (NIZORAL) 2 % cream Apply 1 application topically 2 (two) times daily. Reported on 10/30/2015    . methadone (DOLOPHINE) 5 MG tablet Limit 2-3 tabs by mouth per day if tolerated 90 tablet 0  . metoprolol succinate (TOPROL-XL) 50 MG 24 hr tablet Take 50 mg by mouth daily. Take with or immediately following a meal.    . Multiple Vitamin (MULTIVITAMIN) tablet Take 1 tablet by mouth daily.    Marland Kitchen sulfamethoxazole-trimethoprim (BACTRIM DS,SEPTRA DS) 800-160 MG per tablet Take 1 tablet by mouth 2 (two) times daily. Reported on 10/30/2015     Current Facility-Administered Medications on File Prior to Visit  Medication Dose Route Frequency Provider Last Rate Last Dose  . bupivacaine (MARCAINE) 0.5 % injection 30 mL  30 mL Other Once Mohammed Kindle, MD      . bupivacaine (PF) (MARCAINE) 0.25 % injection 30 mL  30 mL Other Once Mohammed Kindle, MD      . bupivacaine (PF) (MARCAINE) 0.25 % injection 30 mL  30 mL Other Once Mohammed Kindle, MD      . ciprofloxacin (CIPRO) IVPB 400 mg  400 mg Intravenous Once Mohammed Kindle, MD      . fentaNYL (SUBLIMAZE) injection 100 mcg  100 mcg Intravenous Once Mohammed Kindle, MD      . fentaNYL (SUBLIMAZE) injection 100 mcg  100 mcg Intravenous Once Mohammed Kindle, MD      . fentaNYL (SUBLIMAZE) injection 100 mcg  100 mcg Intravenous Once Mohammed Kindle, MD      . lactated ringers infusion 1,000 mL  1,000 mL Intravenous Continuous Mohammed Kindle, MD      . lactated ringers infusion 1,000 mL  1,000 mL Intravenous Continuous Mohammed Kindle, MD      . lactated ringers infusion 1,000 mL  1,000 mL Intravenous Continuous Mohammed Kindle, MD      . lidocaine (PF) (XYLOCAINE) 1 % injection 10 mL  10 mL Subcutaneous Once Mohammed Kindle, MD      . midazolam (VERSED) 5 MG/5ML injection 5 mg  5 mg Intravenous Once Mohammed Kindle, MD      . midazolam (VERSED) 5 MG/5ML injection 5 mg  5 mg  Intravenous Once Mohammed Kindle, MD      . midazolam (VERSED) 5 MG/5ML injection 5 mg  5 mg Intravenous Once Mohammed Kindle, MD      . orphenadrine (NORFLEX) injection 60 mg  60 mg Intramuscular Once Mohammed Kindle, MD      . orphenadrine (NORFLEX) injection 60 mg  60 mg Intramuscular Once Mohammed Kindle, MD      . orphenadrine (NORFLEX) injection 60 mg  60 mg Intramuscular Once  Mohammed Kindle, MD      . sodium chloride 0.9 % injection 20 mL  20 mL Other Once Mohammed Kindle, MD      . triamcinolone acetonide [facility]) injection 40 mg  40 mg Other Once Mohammed Kindle, MD      . triamcinolone acetonide [facility]) injection 40 mg  40 mg Other Once Mohammed Kindle, MD      . triamcinolone acetonide [facility]) injection 40 mg  40 mg Other Once Mohammed Kindle, MD        Review of Systems Patient denies any headaches, blurred vision, shortness of breath, chest pain, abdominal pain, problems with bowel movements, urination, or intercourse.  Objective:  BP 112/78 mmHg  Pulse 69  Ht 5' 10.5" (1.791 m)  Wt 200 lb 9.6 oz (90.992 kg)  BMI 28.37 kg/m2 Physical Exam  General:  Well developed, well nourished, no acute distress. She is alert and oriented x3. Skin:  Warm and dry Neck:  Midline trachea, no thyromegaly or nodules Cardiovascular: Regular rate and rhythm, no murmur heard Lungs:  Effort normal, all lung fields clear to auscultation bilaterally Breasts:  No dominant palpable mass, retraction, or nipple discharge Abdomen:  Soft, non tender, no hepatosplenomegaly or masses Pelvic:  External genitalia is normal in appearance.  The vagina is normal in appearance. The cervical cuff is atrophic but normal.  Thin prep pap is not done . Uterus is surgically absent.  No adnexal masses or tenderness noted. Extremities:  No swelling or varicosities noted Psych:  She has a normal mood and affect  Assessment:   Healthy well-woman exam, s/p total hysterectomy  Plan:  Labs per PCP F/U 1 year for AE, or  sooner if needed Mammogram ordered by PCP  Jahel Wavra Rockney Ghee, CNM

## 2015-11-13 ENCOUNTER — Ambulatory Visit: Payer: Managed Care, Other (non HMO) | Attending: Pain Medicine | Admitting: Pain Medicine

## 2015-11-13 ENCOUNTER — Encounter: Payer: Medicare Other | Admitting: Pain Medicine

## 2015-11-13 ENCOUNTER — Encounter: Payer: Self-pay | Admitting: Pain Medicine

## 2015-11-13 VITALS — BP 128/92 | HR 78 | Temp 98.1°F | Resp 16

## 2015-11-13 DIAGNOSIS — M5010 Cervical disc disorder with radiculopathy, unspecified cervical region: Secondary | ICD-10-CM

## 2015-11-13 DIAGNOSIS — Z981 Arthrodesis status: Secondary | ICD-10-CM

## 2015-11-13 DIAGNOSIS — M48061 Spinal stenosis, lumbar region without neurogenic claudication: Secondary | ICD-10-CM

## 2015-11-13 DIAGNOSIS — M533 Sacrococcygeal disorders, not elsewhere classified: Secondary | ICD-10-CM

## 2015-11-13 DIAGNOSIS — M7061 Trochanteric bursitis, right hip: Secondary | ICD-10-CM

## 2015-11-13 DIAGNOSIS — M7022 Olecranon bursitis, left elbow: Secondary | ICD-10-CM

## 2015-11-13 DIAGNOSIS — M5481 Occipital neuralgia: Secondary | ICD-10-CM

## 2015-11-13 DIAGNOSIS — M47812 Spondylosis without myelopathy or radiculopathy, cervical region: Secondary | ICD-10-CM

## 2015-11-13 DIAGNOSIS — G5603 Carpal tunnel syndrome, bilateral upper limbs: Secondary | ICD-10-CM

## 2015-11-13 DIAGNOSIS — G90529 Complex regional pain syndrome I of unspecified lower limb: Secondary | ICD-10-CM

## 2015-11-13 DIAGNOSIS — M7062 Trochanteric bursitis, left hip: Secondary | ICD-10-CM

## 2015-11-13 DIAGNOSIS — G609 Hereditary and idiopathic neuropathy, unspecified: Secondary | ICD-10-CM

## 2015-11-13 DIAGNOSIS — M47817 Spondylosis without myelopathy or radiculopathy, lumbosacral region: Secondary | ICD-10-CM

## 2015-11-13 DIAGNOSIS — M5030 Other cervical disc degeneration, unspecified cervical region: Secondary | ICD-10-CM

## 2015-11-13 DIAGNOSIS — M19041 Primary osteoarthritis, right hand: Secondary | ICD-10-CM

## 2015-11-13 DIAGNOSIS — M5136 Other intervertebral disc degeneration, lumbar region: Secondary | ICD-10-CM

## 2015-11-13 DIAGNOSIS — M19042 Primary osteoarthritis, left hand: Secondary | ICD-10-CM

## 2015-11-13 MED ORDER — TIZANIDINE HCL 2 MG PO TABS: ORAL_TABLET | ORAL | Status: DC

## 2015-11-13 MED ORDER — GABAPENTIN 600 MG PO TABS: ORAL_TABLET | ORAL | Status: DC

## 2015-11-13 MED ORDER — METHADONE HCL 5 MG PO TABS: ORAL_TABLET | ORAL | Status: DC

## 2015-11-13 MED ORDER — DULOXETINE HCL 60 MG PO CPEP: ORAL_CAPSULE | ORAL | Status: DC

## 2015-11-13 NOTE — Progress Notes (Signed)
Subjective:    Patient ID: Jamie Kim, female    DOB: 01-11-1961, 55 y.o.   MRN: [id number]  HPI  The patient is a 55 year old female who returns to pain management for further evaluation and treatment of pain involving the neck entire back upper and lower extremity regions. The patient is status post surgical intervention of the cervical region and is with pain of the cervical region with dropping of objects at time as well. We've advised patient to follow-up with her neurosurgeon Dr.Haglund for neurosurgical be assessment of patient's condition. At present time patient states she has return of significant lumbar and lower extremity pain. The pain is aggravated with twisting turning standing walking maneuvers. There is concern regarding significant component of pain due to for lumbar facet syndrome. We will schedule patient for lumbar facet, medial branch nerve blocks to be performed at time return appointment in attempt to decrease severity of symptoms, minimize progression of symptoms, and avoid the need for more involved treatment. All agreed to suggested treatment plan. The patient will continue her present prescribed medications consisting of Cymbalta Neurontin Zanaflex and methadone as well as vitamin B . All agreed to suggested treatment plan      Review of Systems     Objective:   Physical Exam  There was tenderness to palpation of the paraspinal muscular region cervical and cervical facet region palpation which be produced pain of mild to moderate degree with well-healed surgical scar of the cervical region without increased warmth and erythema in the region scar. There was tenderness over the region of the splenius capitis and occipitalis must region of mild to moderate degree. Palpation of the acromioclavicular and glenohumeral joint regions reproduces moderate discomfort. The patient appeared to be unremarkable Spurling's maneuver. The patient was with decreased grip  strength. Tinel and Phalen's maneuver associated with mild increase of pain. There was tenderness over the thoracic facet thoracic paraspinal musculature region with no crepitus of the thoracic region noted. Palpation over the lumbar paraspinal musculatures and lumbar facet region associated with moderate discomfort with lateral bending rotation extension and palpation of lumbar facets reproducing moderate to moderately severe discomfort. There was moderate tenderness of the PSIS and PII S region as well as the gluteal and piriformis muscles regions. Straight leg raise was tolerates approximately 20 without a definite increased pain with dorsiflexion noted. There was negative clonus negative Homans. There was moderate tenderness of the greater trochanteric region iliotibial band region. Abdomen nontender with no costovertebral tenderness noted      Assessment & Plan:     Degenerative disc disease lumbar spine L3-4 and L4-5 degenerative changes with pain no disc bulging L4-L5 level with for left lateral disc component includes proximity to extraforaminal L5 nerve root and L5-S1 broad-based disc bulge with bilateral facet arthropathy  Lumbar facet syndrome  Degenerative changes of the cervical spine C5-C6 C6-7 and C7 1 degenerative changes with prior cervical discectomy and fusion  Sacroiliac joint dysfunction, sacroiliitis  Degenerative joint disease/tendinitis of hands     PLAN   Continue present medication Cymbalta Neurontin Zanaflex and methadone Take vitamin B complex since insurance will not approve Metanx  Lumbar facet, medial branch nerve blocks, to be performed at time of return appointment   F/U PCP  Dr. Geoffry Paradise for evaliation of  BP and general medical  condition. Please follow-up with Dr. Lisbeth Ply regarding blood pressure and discuss rheumatological evaluation as well  F/U surgical evaluation. May consider pending follow-up evaluations Patient will follow-up  with  Dr.Haglund as needed   F/U neurological evaluation. May consider PNCV/EMG studies and other studies pending follow-up evaluations  Ask the secretary and nurses if you have been given appointment to see Dr. Veronia Beets for evaluation of your hand  May consider radiofrequency rhizolysis or intraspinal procedures pending response to present treatment and F/U evaluation   Patient to call [facility] should patient have concerns prior to scheduled return appointment.

## 2015-11-13 NOTE — Patient Instructions (Addendum)
PLAN   Continue present medication Cymbalta Neurontin Zanaflex and methadone Take vitamin B complex since insurance will not approve Metanx  Lumbar facet, medial branch nerve blocks, to be performed at time of return appointment   F/U PCP  Dr. Geoffry Paradise for evaliation of  BP and general medical  condition. Please follow-up with Dr. Lisbeth Ply regarding blood pressure and discuss rheumatological evaluation as well  F/U surgical evaluation. May consider pending follow-up evaluations Patient will follow-up with Dr.Haglund as needed   F/U neurological evaluation. May consider PNCV/EMG studies and other studies pending follow-up evaluations  Ask the secretary and nurses if you have been given appointment to see Dr. Veronia Beets for evaluation of your hand  May consider radiofrequency rhizolysis or intraspinal procedures pending response to present treatment and F/U evaluation   Patient to call [facility] Management Center should patient have concerns prior to scheduled return appointment. Facet Blocks Patient Information  Description: The facets are joints in the spine between the vertebrae.  Like any joints in the body, facets can become irritated and painful.  Arthritis can also effect the facets.  By injecting steroids and local anesthetic in and around these joints, we can temporarily block the nerve supply to them.  Steroids act directly on irritated nerves and tissues to reduce selling and inflammation which often leads to decreased pain.  Facet blocks may be done anywhere along the spine from the neck to the low back depending upon the location of your pain.   After numbing the skin with local anesthetic (like Novocaine), a small needle is passed onto the facet joints under x-ray guidance.  You may experience a sensation of pressure while this is being done.  The entire block usually lasts about 15-25 minutes.   Conditions which may be treated by facet blocks:   Low back/buttock pain  Neck/shoulder  pain  Certain types of headaches  Preparation for the injection:  1. Do not eat any solid food or dairy products within 8 hours of your appointment. 2. You may drink clear liquid up to 3 hours before appointment.  Clear liquids include water, black coffee, juice or soda.  No milk or cream please. 3. You may take your regular medication, including pain medications, with a sip of water before your appointment.  Diabetics should hold regular insulin (if taken separately) and take 1/2 normal NPH dose the morning of the procedure.  Carry some sugar containing items with you to your appointment. 4. A driver must accompany you and be prepared to drive you home after your procedure. 5. Bring all your current medications with you. 6. An IV may be inserted and sedation may be given at the discretion of the physician. 7. A blood pressure cuff, EKG and other monitors will often be applied during the procedure.  Some patients may need to have extra oxygen administered for a short period. 8. You will be asked to provide medical information, including your allergies and medications, prior to the procedure.  We must know immediately if you are taking blood thinners (like Coumadin/Warfarin) or if you are allergic to IV iodine contrast (dye).  We must know if you could possible be pregnant.  Possible side-effects:   Bleeding from needle site  Infection (rare, may require surgery)  Nerve injury (rare)  Numbness & tingling (temporary)  Difficulty urinating (rare, temporary)  Spinal headache (a headache worse with upright posture)  Light-headedness (temporary)  Pain at injection site (serveral days)  Decreased blood pressure (rare, temporary)  Weakness  in arm/leg (temporary)  Pressure sensation in back/neck (temporary)   Call if you experience:   Fever/chills associated with headache or increased back/neck pain  Headache worsened by an upright position  New onset, weakness or numbness of  an extremity below the injection site  Hives or difficulty breathing (go to the emergency room)  Inflammation or drainage at the injection site(s)  Severe back/neck pain greater than usual  New symptoms which are concerning to you  Please note:  Although the local anesthetic injected can often make your back or neck feel good for several hours after the injection, the pain will likely return. It takes 3-7 days for steroids to work.  You may not notice any pain relief for at least one week.  If effective, we will often do a series of 2-3 injections spaced 3-6 weeks apart to maximally decrease your pain.  After the initial series, you may be a candidate for a more permanent nerve block of the facets.  If you have any questions, please call #[phone] [facility]

## 2015-11-13 NOTE — Progress Notes (Signed)
Safety precautions to be maintained throughout the outpatient stay will include: orient to surroundings, keep bed in low position, maintain call bell within reach at all times, provide assistance with transfer out of bed and ambulation.  

## 2015-11-22 LAB — TOXASSURE SELECT 13 (MW), URINE

## 2015-11-24 NOTE — Progress Notes (Signed)
Quick Note:  Reviewed. ______ 

## 2015-12-11 ENCOUNTER — Ambulatory Visit: Payer: Managed Care, Other (non HMO) | Attending: Pain Medicine | Admitting: Pain Medicine

## 2015-12-11 ENCOUNTER — Encounter: Payer: Self-pay | Admitting: Pain Medicine

## 2015-12-11 VITALS — BP 138/83 | HR 64 | Temp 98.0°F | Resp 16 | Ht 70.0 in | Wt 180.0 lb

## 2015-12-11 DIAGNOSIS — G90529 Complex regional pain syndrome I of unspecified lower limb: Secondary | ICD-10-CM

## 2015-12-11 DIAGNOSIS — M5030 Other cervical disc degeneration, unspecified cervical region: Secondary | ICD-10-CM

## 2015-12-11 DIAGNOSIS — M62830 Muscle spasm of back: Secondary | ICD-10-CM | POA: Diagnosis not present

## 2015-12-11 DIAGNOSIS — M47816 Spondylosis without myelopathy or radiculopathy, lumbar region: Secondary | ICD-10-CM | POA: Insufficient documentation

## 2015-12-11 DIAGNOSIS — M533 Sacrococcygeal disorders, not elsewhere classified: Secondary | ICD-10-CM | POA: Diagnosis not present

## 2015-12-11 DIAGNOSIS — M19042 Primary osteoarthritis, left hand: Secondary | ICD-10-CM

## 2015-12-11 DIAGNOSIS — M1288 Other specific arthropathies, not elsewhere classified, other specified site: Secondary | ICD-10-CM | POA: Insufficient documentation

## 2015-12-11 DIAGNOSIS — M47812 Spondylosis without myelopathy or radiculopathy, cervical region: Secondary | ICD-10-CM | POA: Diagnosis not present

## 2015-12-11 DIAGNOSIS — M47817 Spondylosis without myelopathy or radiculopathy, lumbosacral region: Secondary | ICD-10-CM

## 2015-12-11 DIAGNOSIS — G5603 Carpal tunnel syndrome, bilateral upper limbs: Secondary | ICD-10-CM

## 2015-12-11 DIAGNOSIS — M7061 Trochanteric bursitis, right hip: Secondary | ICD-10-CM

## 2015-12-11 DIAGNOSIS — M461 Sacroiliitis, not elsewhere classified: Secondary | ICD-10-CM | POA: Diagnosis not present

## 2015-12-11 DIAGNOSIS — M19041 Primary osteoarthritis, right hand: Secondary | ICD-10-CM | POA: Diagnosis not present

## 2015-12-11 DIAGNOSIS — Z981 Arthrodesis status: Secondary | ICD-10-CM | POA: Diagnosis not present

## 2015-12-11 DIAGNOSIS — M7062 Trochanteric bursitis, left hip: Secondary | ICD-10-CM

## 2015-12-11 DIAGNOSIS — M5136 Other intervertebral disc degeneration, lumbar region: Secondary | ICD-10-CM | POA: Diagnosis not present

## 2015-12-11 DIAGNOSIS — M51369 Other intervertebral disc degeneration, lumbar region without mention of lumbar back pain or lower extremity pain: Secondary | ICD-10-CM

## 2015-12-11 DIAGNOSIS — M48061 Spinal stenosis, lumbar region without neurogenic claudication: Secondary | ICD-10-CM

## 2015-12-11 DIAGNOSIS — M7022 Olecranon bursitis, left elbow: Secondary | ICD-10-CM

## 2015-12-11 DIAGNOSIS — M542 Cervicalgia: Secondary | ICD-10-CM | POA: Diagnosis present

## 2015-12-11 DIAGNOSIS — M545 Low back pain: Secondary | ICD-10-CM | POA: Diagnosis present

## 2015-12-11 DIAGNOSIS — M5010 Cervical disc disorder with radiculopathy, unspecified cervical region: Secondary | ICD-10-CM

## 2015-12-11 DIAGNOSIS — M5481 Occipital neuralgia: Secondary | ICD-10-CM

## 2015-12-11 MED ORDER — METHADONE HCL 5 MG PO TABS: ORAL_TABLET | ORAL | Status: DC

## 2015-12-11 NOTE — Patient Instructions (Addendum)
PLAN   Continue present medication Cymbalta Neurontin Zanaflex and methadone Take vitamin B complex since insurance will not approve Metanx  Lumbar facet, medial branch nerve, blocks to be performed at time return appointment pending insurance approval  F/U PCP  Dr. Geoffry Paradise for evaliation of  BP and general medical  condition and discuss rheumatological evaluation as well  F/U surgical evaluation. May consider pending follow-up evaluations Patient will follow-up with Dr.Haglund as needed   F/U neurological evaluation. May consider PNCV/EMG studies and other studies pending follow-up evaluations  Ask the secretary and nurses if you have been given appointment to see Dr. Veronia Beets for evaluation of your hand as previously discussed  May consider radiofrequency rhizolysis or intraspinal procedures pending response to present treatment and F/U evaluation   Patient to call [facility] should patient have concerns prior to scheduled return appointment.  Preprocedure inst given to pt.  NPO after midnight.  B/p meds morning of procedure.  Lumbar facets information sheet given to pt.

## 2015-12-11 NOTE — Progress Notes (Signed)
Subjective:    Patient ID: Jamie Kim, female    DOB: [birth year], 55 y.o.   MRN: [id number]  HPI  The patient is a 55 year old female who returns to pain management for further evaluation and treatment of pain involving the region of neck upper extremity regions lower back and lower extremity region. The patient is with prior surgical intervention of the cervical region by Dr. Pamala Hurry of [facility]. The patient states that the lower back lower extremity pain has continues to interfere with activities of daily living as well as ability to obtain restful sleep. The pain is severe with range of motion maneuvers including twisting turning maneuvers reproducing severe disabling pain. We are awaiting insurance approval for patient to be approval for lumbar facet, medial branch nerve, blocks. The patient will continue presently prescribed medications including Cymbalta Neurontin Zanaflex and methadone and vitamin B complex. We will proceed with lumbar facet, medial branch nerve, blocks at time return appointment pending insurance approval. All agreed to suggested treatment plan. We will also discussed patient undergoing reevaluation with neurosurgeon Dr. Pamala Hurry for evaluation of severely disabling lumbar and lower extremity pain and will proceed with such evaluation as discussed pending response to lumbar facet, medial branch nerve, blocks   Review of Systems     Objective:   Physical Exam   There was tends to palpation of paraspinal muscular region the cervical region cervical facet region of moderate degree with well-healed surgical scar of the cervical region without increased warmth and erythema in the region of the scar. Palpation over the region of the acromioclavicular and glenohumeral joint regions reproduced mild to moderate discomfort and patient appeared to be with unremarkable Spurling's maneuver. The patient appeared to be with slightly decreased grip strength with Tinel and  Phalen's maneuver reproducing minimal discomfort. There was tenderness over the thoracic facet thoracic paraspinal musculature region with evidence of moderate muscle spasm involving the lower thoracic paraspinal musculature region without crepitus of the thoracic region noted. Palpation over the lumbar paraspinal musculatures and lumbar facet region was associated with severe discomfort with lateral bending rotation extension and palpation of the lumbar facets reproducing severe disabling pain. Straight leg raising was tolerated to 30 without increased pain with dorsiflexion noted. There was moderate tenderness of the greater trochanteric region iliotibial band region as well as moderate to moderately severe tenderness of the PSIS and PII S regions. No sensory deficit or dermatomal dystrophy detected. DTRs appeared to be trace at the knees. There was negative clonus negative Homans. Abdomen nontender with no costovertebral tenderness noted     Assessment & Plan:      Degenerative disc disease lumbar spine L3-4 and L4-5 degenerative changes with pain no disc bulging L4-L5 level with for left lateral disc component includes proximity to extraforaminal L5 nerve root and L5-S1 broad-based disc bulge with bilateral facet arthropathy  Lumbar facet syndrome  Degenerative changes of the cervical spine C5-C6 C6-7 and C7 1 degenerative changes with prior cervical discectomy and fusion  Sacroiliac joint dysfunction, sacroiliitis  Degenerative joint disease/tendinitis of hands      PLAN   Continue present medication Cymbalta Neurontin Zanaflex and methadone Take vitamin B complex since insurance will not approve Metanx  Lumbar facet, medial branch nerve, blocks to be performed at time return appointment pending insurance approval  F/U PCP  Dr. Geoffry Paradise for evaliation of  BP and general medical  condition and discuss rheumatological evaluation as well  F/U surgical evaluation. May consider  pending follow-up  evaluations Patient will follow-up with Dr.Haglund as needed   F/U neurological evaluation. May consider PNCV/EMG studies and other studies pending follow-up evaluations  Ask the secretary and nurses if you have been given appointment to see Dr. Veronia Beets for evaluation of your hand as previously discussed  May consider radiofrequency rhizolysis or intraspinal procedures pending response to present treatment and F/U evaluation   Patient to call [facility] should patient have concerns prior to scheduled return appointment.

## 2015-12-31 ENCOUNTER — Telehealth: Payer: Self-pay | Admitting: Pain Medicine

## 2015-12-31 ENCOUNTER — Encounter: Payer: Self-pay | Admitting: Pain Medicine

## 2015-12-31 ENCOUNTER — Ambulatory Visit: Payer: Managed Care, Other (non HMO) | Attending: Pain Medicine | Admitting: Pain Medicine

## 2015-12-31 DIAGNOSIS — M47816 Spondylosis without myelopathy or radiculopathy, lumbar region: Secondary | ICD-10-CM | POA: Diagnosis not present

## 2015-12-31 DIAGNOSIS — M5030 Other cervical disc degeneration, unspecified cervical region: Secondary | ICD-10-CM

## 2015-12-31 DIAGNOSIS — G5603 Carpal tunnel syndrome, bilateral upper limbs: Secondary | ICD-10-CM

## 2015-12-31 DIAGNOSIS — M545 Low back pain: Secondary | ICD-10-CM | POA: Diagnosis present

## 2015-12-31 DIAGNOSIS — G609 Hereditary and idiopathic neuropathy, unspecified: Secondary | ICD-10-CM

## 2015-12-31 DIAGNOSIS — M533 Sacrococcygeal disorders, not elsewhere classified: Secondary | ICD-10-CM

## 2015-12-31 DIAGNOSIS — G90529 Complex regional pain syndrome I of unspecified lower limb: Secondary | ICD-10-CM

## 2015-12-31 DIAGNOSIS — Z981 Arthrodesis status: Secondary | ICD-10-CM

## 2015-12-31 DIAGNOSIS — M7062 Trochanteric bursitis, left hip: Secondary | ICD-10-CM

## 2015-12-31 DIAGNOSIS — M48061 Spinal stenosis, lumbar region without neurogenic claudication: Secondary | ICD-10-CM

## 2015-12-31 DIAGNOSIS — M19042 Primary osteoarthritis, left hand: Secondary | ICD-10-CM

## 2015-12-31 DIAGNOSIS — M5126 Other intervertebral disc displacement, lumbar region: Secondary | ICD-10-CM | POA: Insufficient documentation

## 2015-12-31 DIAGNOSIS — M7061 Trochanteric bursitis, right hip: Secondary | ICD-10-CM

## 2015-12-31 DIAGNOSIS — M5136 Other intervertebral disc degeneration, lumbar region: Secondary | ICD-10-CM | POA: Diagnosis not present

## 2015-12-31 DIAGNOSIS — M47812 Spondylosis without myelopathy or radiculopathy, cervical region: Secondary | ICD-10-CM

## 2015-12-31 DIAGNOSIS — M1288 Other specific arthropathies, not elsewhere classified, other specified site: Secondary | ICD-10-CM | POA: Diagnosis not present

## 2015-12-31 DIAGNOSIS — M5010 Cervical disc disorder with radiculopathy, unspecified cervical region: Secondary | ICD-10-CM

## 2015-12-31 DIAGNOSIS — M5481 Occipital neuralgia: Secondary | ICD-10-CM

## 2015-12-31 DIAGNOSIS — M47817 Spondylosis without myelopathy or radiculopathy, lumbosacral region: Secondary | ICD-10-CM

## 2015-12-31 DIAGNOSIS — M19041 Primary osteoarthritis, right hand: Secondary | ICD-10-CM

## 2015-12-31 DIAGNOSIS — M51369 Other intervertebral disc degeneration, lumbar region without mention of lumbar back pain or lower extremity pain: Secondary | ICD-10-CM

## 2015-12-31 DIAGNOSIS — M7022 Olecranon bursitis, left elbow: Secondary | ICD-10-CM

## 2015-12-31 MED ORDER — LACTATED RINGERS IV SOLN: 1000.0000 mL | INTRAVENOUS | Status: DC

## 2015-12-31 MED ORDER — TRIAMCINOLONE ACETONIDE 40 MG/ML IJ SUSP
40.0000 mg | Freq: Once | INTRAMUSCULAR | Status: AC
  Administered 2015-12-31: 40 mg
  Filled 2015-12-31: qty 1

## 2015-12-31 MED ORDER — CIPROFLOXACIN HCL 250 MG PO TABS: 250.0000 mg | ORAL_TABLET | Freq: Two times a day (BID) | ORAL | Status: DC

## 2015-12-31 MED ORDER — DOXYCYCLINE HYCLATE 100 MG PO TABS: 100.0000 mg | ORAL_TABLET | Freq: Two times a day (BID) | ORAL | Status: DC

## 2015-12-31 MED ORDER — ORPHENADRINE CITRATE 30 MG/ML IJ SOLN
60.0000 mg | Freq: Once | INTRAMUSCULAR | Status: AC
  Administered 2015-12-31: 60 mg via INTRAMUSCULAR
  Filled 2015-12-31: qty 2

## 2015-12-31 MED ORDER — MIDAZOLAM HCL 5 MG/5ML IJ SOLN
5.0000 mg | Freq: Once | INTRAMUSCULAR | Status: AC
  Administered 2015-12-31: 3 mg via INTRAVENOUS
  Filled 2015-12-31: qty 5

## 2015-12-31 MED ORDER — CIPROFLOXACIN IN D5W 400 MG/200ML IV SOLN: 400.0000 mg | Freq: Once | INTRAVENOUS | Status: DC

## 2015-12-31 MED ORDER — FENTANYL CITRATE (PF) 100 MCG/2ML IJ SOLN
100.0000 ug | Freq: Once | INTRAMUSCULAR | Status: AC
  Administered 2015-12-31: 100 ug via INTRAVENOUS
  Filled 2015-12-31: qty 2

## 2015-12-31 MED ORDER — BUPIVACAINE HCL (PF) 0.25 % IJ SOLN
30.0000 mL | Freq: Once | INTRAMUSCULAR | Status: AC
  Administered 2015-12-31: 30 mL
  Filled 2015-12-31: qty 30

## 2015-12-31 NOTE — Telephone Encounter (Signed)
Called pharm to let them know that current antibiotic is going to be change and pt will bring script doxy in

## 2015-12-31 NOTE — Progress Notes (Signed)
Subjective:    Patient ID: Jamie Kim, female    DOB: 06-17-1961, 55 y.o.   MRN: [id number]  HPI  PROCEDURE PERFORMED: Lumbar facet (medial branch block)   NOTE: The patient is a 55 y.o. female who returns to [facility] for further evaluation and treatment of pain involving the lumbar and lower extremity region. MRI  revealed the patient to be with evidence of degenerative disc disease lumbar spine with L3-4 and L4-5 degenerative changes with pain no disc bulging L4-L5 level with for left lateral disc component includes proximity to extraforaminal L5 nerve root and L5-S1 broad-based disc bulge with bilateral facet arthropathy. There is concern regarding significant component of patient's pain began due to lumbar facet syndrome The risks, benefits, and expectations of the procedure have been discussed and explained to the patient who was understanding and in agreement with suggested treatment plan. We will proceed with interventional treatment as discussed and as explained to the patient who was understanding and wished to proceed with procedure as planned.   DESCRIPTION OF PROCEDURE: Lumbar facet (medial branch block) with IV Versed, IV fentanyl conscious sedation, EKG, blood pressure, pulse, capnography, and pulse oximetry monitoring. The procedure was performed with the patient in the prone position. Betadine prep of proposed entry site performed.   NEEDLE PLACEMENT AT: Left L 2 lumbar facet (medial branch block). Under fluoroscopic guidance with oblique orientation of 15 degrees, a 22-gauge needle was inserted at the L 2 vertebral body level with needle placed at the targeted area of Burton's Eye or Eye of the Scotty Dog with documentation of needle placement in the superior and lateral border of targeted area of Burton's Eye or Eye of the Scotty Dog with oblique orientation of 15 degrees. Following documentation of needle placement at the L 2 vertebral body level, needle  placement was then accomplished at the L 3 vertebral body level.   NEEDLE PLACEMENT AT L3, L4, and L5 VERTEBRAL BODY LEVELS ON THE LEFT SIDE The procedure was performed at the L3, L4, and L5 vertebral body levels exactly as was performed at the L 2 vertebral body level utilizing the same technique and under fluoroscopic guidance.  NEEDLE PLACEMENT AT THE SACRAL ALA with AP view of the lumbosacral spine. With the patient in the prone position, Betadine prep of proposed entry site accomplished, a 22 gauge needle was inserted in the region of the sacral ala (groove formed by the superior articulating process of S1 and the sacral wing). Following documentation of needle placement at the sacral ala,    Needle placement was then verified at all levels on lateral view. Following documentation of needle placement at all levels on lateral view and following negative aspiration for heme and CSF, each level was injected with 1 mL of 0.25% bupivacaine with Kenalog.     LUMBAR FACET, MEDIAL BRANCH NERVE, BLOCKS PERFORMED ON THE RIGHT SIDE   The procedure was performed on the right side exactly as was performed on the left side at the same levels and utilizing the same technique under fluoroscopic guidance.     The patient tolerated the procedure well. A total of 40 mg of Kenalog was utilized for the procedure.   PLAN:  1. Medications: The patient will continue presently prescribed medications. Cymbalta Neurontin Zanaflex and methadone. The patient will also take vitamin B complex 2. May consider modification of treatment regimen at time of return appointment pending response to treatment rendered on today's visit. 3. The patient is to  follow-up with primary care physician Dr. Lisbeth Ply for further evaluation of blood pressure and general medical condition status post steroid injection performed on today's visit. 4. Surgical follow-up evaluation. The patient will follow-up with Dr.  Pamala Hurry 5. Neurological follow-up evaluation. May consider PNCV EMG studies and other studies 6. The patient may be candidate for radiofrequency procedures, implantation type procedures, and other treatment pending response to treatment and follow-up evaluation. 7. The patient has been advised to call the [facility] prior to scheduled return appointment should there be significant change in condition or should patient have other concerns regarding condition prior to scheduled return appointment.  The patient is understanding and in agreement with suggested treatment plan.   Review of Systems     Objective:   Physical Exam        Assessment & Plan:

## 2015-12-31 NOTE — Progress Notes (Signed)
Safety precautions to be maintained throughout the outpatient stay will include: orient to surroundings, keep bed in low position, maintain call bell within reach at all times, provide assistance with transfer out of bed and ambulation.  

## 2015-12-31 NOTE — Patient Instructions (Addendum)
PLAN   Continue present medication Cymbalta Neurontin Zanaflex and methadone Take vitamin B complex since insurance will not approve Metanx Please get doxycycline antibiotic today and begin taking doxycycline antibiotic today as prescribed  F/U PCP  Dr. Geoffry Paradise for evaliation of  BP and general medical  condition and discuss rheumatological evaluation as well  F/U surgical evaluation. May consider pending follow-up evaluations Patient will follow-up with Dr.Haglund as needed   F/U neurological evaluation. May consider PNCV/EMG studies and other studies pending follow-up evaluations  Ask the secretary and nurses if you have been given appointment to see Dr. Veronia Beets for evaluation of your hand as previously discussed  May consider radiofrequency rhizolysis or intraspinal procedures pending response to present treatment and F/U evaluation   Patient to call [facility] should patient have concerns prior to scheduled return appointment.   Facet Blocks Patient Information  Description: The facets are joints in the spine between the vertebrae.  Like any joints in the body, facets can become irritated and painful.  Arthritis can also effect the facets.  By injecting steroids and local anesthetic in and around these joints, we can temporarily block the nerve supply to them.  Steroids act directly on irritated nerves and tissues to reduce selling and inflammation which often leads to decreased pain.  Facet blocks may be done anywhere along the spine from the neck to the low back depending upon the location of your pain.   After numbing the skin with local anesthetic (like Novocaine), a small needle is passed onto the facet joints under x-ray guidance.  You may experience a sensation of pressure while this is being done.  The entire block usually lasts about 15-25 minutes.   Conditions which may be treated by facet blocks:   Low back/buttock pain  Neck/shoulder pain  Certain types of  headaches  Preparation for the injection:  1. Do not eat any solid food or dairy products within 8 hours of your appointment. 2. You may drink clear liquid up to 3 hours before appointment.  Clear liquids include water, black coffee, juice or soda.  No milk or cream please. 3. You may take your regular medication, including pain medications, with a sip of water before your appointment.  Diabetics should hold regular insulin (if taken separately) and take 1/2 normal NPH dose the morning of the procedure.  Carry some sugar containing items with you to your appointment. 4. A driver must accompany you and be prepared to drive you home after your procedure. 5. Bring all your current medications with you. 6. An IV may be inserted and sedation may be given at the discretion of the physician. 7. A blood pressure cuff, EKG and other monitors will often be applied during the procedure.  Some patients may need to have extra oxygen administered for a short period. 8. You will be asked to provide medical information, including your allergies and medications, prior to the procedure.  We must know immediately if you are taking blood thinners (like Coumadin/Warfarin) or if you are allergic to IV iodine contrast (dye).  We must know if you could possible be pregnant.  Possible side-effects:   Bleeding from needle site  Infection (rare, may require surgery)  Nerve injury (rare)  Numbness & tingling (temporary)  Difficulty urinating (rare, temporary)  Spinal headache (a headache worse with upright posture)  Light-headedness (temporary)  Pain at injection site (serveral days)  Decreased blood pressure (rare, temporary)  Weakness in arm/leg (temporary)  Pressure sensation  in back/neck (temporary)   Call if you experience:   Fever/chills associated with headache or increased back/neck pain  Headache worsened by an upright position  New onset, weakness or numbness of an extremity below the  injection site  Hives or difficulty breathing (go to the emergency room)  Inflammation or drainage at the injection site(s)  Severe back/neck pain greater than usual  New symptoms which are concerning to you  Please note:  Although the local anesthetic injected can often make your back or neck feel good for several hours after the injection, the pain will likely return. It takes 3-7 days for steroids to work.  You may not notice any pain relief for at least one week.  If effective, we will often do a series of 2-3 injections spaced 3-6 weeks apart to maximally decrease your pain.  After the initial series, you may be a candidate for a more permanent nerve block of the facets.  If you have any questions, please call #[phone]) [facility]  Be careful moving about. Muscle spasms in the area of the injection may occur.  Use ice for the next 24 hours (15 minutes on, 15 minutes off).  After 24 hours, you may use heat for comfort if you wish.  Post procedure numbness or redness is expected, average 4-6 hours.  If numbness develops after 4-6 hours and is felt to be progressing and worsening, immediately contact your physician.

## 2015-12-31 NOTE — Telephone Encounter (Signed)
Merry Proud at [facility] called to check on Cipro script sent in, it is contra-indicated with Tizanidene, do you still want her to have this ? Please call Pharmacy

## 2016-01-01 ENCOUNTER — Telehealth: Payer: Self-pay | Admitting: *Deleted

## 2016-01-01 NOTE — Telephone Encounter (Signed)
Message left

## 2016-01-12 ENCOUNTER — Encounter: Payer: Managed Care, Other (non HMO) | Admitting: Pain Medicine

## 2016-01-13 ENCOUNTER — Telehealth: Payer: Self-pay | Admitting: Pain Medicine

## 2016-01-13 NOTE — Telephone Encounter (Signed)
Thank you very much 

## 2016-01-13 NOTE — Telephone Encounter (Signed)
Mrs. Lanzer called, said to tell you she is very sorry about missing appt. 01-12-16 she had so many with kids she forgot her appt. She has rescheduled to 01-20-16

## 2016-01-20 ENCOUNTER — Ambulatory Visit: Payer: Managed Care, Other (non HMO) | Attending: Pain Medicine | Admitting: Pain Medicine

## 2016-01-20 ENCOUNTER — Encounter: Payer: Self-pay | Admitting: Pain Medicine

## 2016-01-20 VITALS — BP 140/93 | HR 62 | Temp 97.9°F | Resp 18 | Ht 70.0 in | Wt 175.0 lb

## 2016-01-20 DIAGNOSIS — Z981 Arthrodesis status: Secondary | ICD-10-CM | POA: Diagnosis not present

## 2016-01-20 DIAGNOSIS — M48061 Spinal stenosis, lumbar region without neurogenic claudication: Secondary | ICD-10-CM

## 2016-01-20 DIAGNOSIS — Z9889 Other specified postprocedural states: Secondary | ICD-10-CM | POA: Insufficient documentation

## 2016-01-20 DIAGNOSIS — M19041 Primary osteoarthritis, right hand: Secondary | ICD-10-CM | POA: Insufficient documentation

## 2016-01-20 DIAGNOSIS — G5603 Carpal tunnel syndrome, bilateral upper limbs: Secondary | ICD-10-CM

## 2016-01-20 DIAGNOSIS — M5481 Occipital neuralgia: Secondary | ICD-10-CM

## 2016-01-20 DIAGNOSIS — M1288 Other specific arthropathies, not elsewhere classified, other specified site: Secondary | ICD-10-CM | POA: Diagnosis not present

## 2016-01-20 DIAGNOSIS — M47817 Spondylosis without myelopathy or radiculopathy, lumbosacral region: Secondary | ICD-10-CM

## 2016-01-20 DIAGNOSIS — M79606 Pain in leg, unspecified: Secondary | ICD-10-CM | POA: Diagnosis present

## 2016-01-20 DIAGNOSIS — M5010 Cervical disc disorder with radiculopathy, unspecified cervical region: Secondary | ICD-10-CM

## 2016-01-20 DIAGNOSIS — M47816 Spondylosis without myelopathy or radiculopathy, lumbar region: Secondary | ICD-10-CM | POA: Insufficient documentation

## 2016-01-20 DIAGNOSIS — M5136 Other intervertebral disc degeneration, lumbar region: Secondary | ICD-10-CM | POA: Diagnosis not present

## 2016-01-20 DIAGNOSIS — M47812 Spondylosis without myelopathy or radiculopathy, cervical region: Secondary | ICD-10-CM | POA: Insufficient documentation

## 2016-01-20 DIAGNOSIS — M542 Cervicalgia: Secondary | ICD-10-CM | POA: Diagnosis present

## 2016-01-20 DIAGNOSIS — M19042 Primary osteoarthritis, left hand: Secondary | ICD-10-CM | POA: Diagnosis not present

## 2016-01-20 DIAGNOSIS — G90529 Complex regional pain syndrome I of unspecified lower limb: Secondary | ICD-10-CM

## 2016-01-20 DIAGNOSIS — M461 Sacroiliitis, not elsewhere classified: Secondary | ICD-10-CM | POA: Insufficient documentation

## 2016-01-20 DIAGNOSIS — M7062 Trochanteric bursitis, left hip: Secondary | ICD-10-CM

## 2016-01-20 DIAGNOSIS — M533 Sacrococcygeal disorders, not elsewhere classified: Secondary | ICD-10-CM | POA: Insufficient documentation

## 2016-01-20 DIAGNOSIS — M7061 Trochanteric bursitis, right hip: Secondary | ICD-10-CM

## 2016-01-20 DIAGNOSIS — M7022 Olecranon bursitis, left elbow: Secondary | ICD-10-CM

## 2016-01-20 DIAGNOSIS — M5030 Other cervical disc degeneration, unspecified cervical region: Secondary | ICD-10-CM

## 2016-01-20 DIAGNOSIS — M545 Low back pain: Secondary | ICD-10-CM | POA: Diagnosis present

## 2016-01-20 MED ORDER — METHADONE HCL 5 MG PO TABS: ORAL_TABLET | ORAL | Status: DC

## 2016-01-20 NOTE — Progress Notes (Signed)
Safety precautions to be maintained throughout the outpatient stay will include: orient to surroundings, keep bed in low position, maintain call bell within reach at all times, provide assistance with transfer out of bed and ambulation.  

## 2016-01-20 NOTE — Progress Notes (Signed)
   Subjective:    Patient ID: Jamie Kim, female    DOB: 11-Mar-1961, 55 y.o.   MRN: [id number]  HPI  The patient is a 55 year old female who returns to pain management for further evaluation and treatment of pain involving the neck upper extremity regions lower back lower extremity region. The patient states that she had improvement of her pain following lumbar facet, medial branch nerve blocks. At the present time we will continue medications consisting of Cymbalta Neurontin Zanaflex and methadone. We remain available to consider modification of treatment should they be significant change in patient's condition. The patient agreed to suggested treatment plan. The patient did mention that she felt that her methadone was becoming less effective. We will consider modifications of treatment regimen pending follow-up evaluations as discussed. The patient will undergo evaluation of her hands by Dr. Veronia Beets at this time. All agreed with plan    Review of Systems     Objective:   Physical Exam   there was tenderness of the paraspinal muscular treat the cervical and cervical facet region of moderate degree with moderate tenderness of the splenius capitis and occipitalis region. Palpation of the acromioclavicular and glenohumeral joint regions reproduce mild to moderate discomfort. There was mild to moderate tenderness over the thoracic region without crepitus of the thoracic region noted. The patient was with decreased grip strength with Tinel and Phalen's maneuver reproducing moderate discomfort. No definite increased warmth erythema in the region of the hands were noted. Palpation over the lumbar region was attends to palpation of moderate degree with lateral bending rotation extension and palpation of the lumbar facets reproducing moderate discomfort. There was moderate tenderness of the PSIS and PII S region a mild tenderness of the greater trochanteric region iliotibial band region. Straight leg  raising was tolerated to 30 E. without increased pain with dorsiflexion noted. DTRs were difficult to elicit. There was crepitus of the knees with negative anterior and posterior drawer signs without ballottement of the patella. There was negative clonus negative Homans. No sensory deficit or dermatomal distribution detected. Abdomen nontender with no costovertebral tenderness noted.     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 Geoffry Paradise for evaliation of  BP and general medical  condition and discuss rheumatological evaluation as well  F/U surgical evaluation. May consider pending follow-up evaluations Patient will follow-up with Dr.Haglund as needed   F/U neurological evaluation. May consider PNCV/EMG studies and other studies pending follow-up evaluations  Appointment with Dr. Veronia Beets for evaluation of your hands as scheduled  May consider radiofrequency rhizolysis or intraspinal procedures pending response to present treatment and F/U evaluation   Patient to call [facility] should patient have concerns prior to scheduled return appointment.

## 2016-01-20 NOTE — Patient Instructions (Addendum)
PLAN   Continue present medication Cymbalta Neurontin Zanaflex and methadone Take vitamin B complex since insurance will not approve Metanx  F/U PCP  Dr. Geoffry Paradise for evaliation of  BP and general medical  condition and discuss rheumatological evaluation as well  F/U surgical evaluation. May consider pending follow-up evaluations Patient will follow-up with Dr.Haglund as needed   F/U neurological evaluation. May consider PNCV/EMG studies and other studies pending follow-up evaluations  Appointment with Dr. Veronia Beets for evaluation of your hands as scheduled  May consider radiofrequency rhizolysis or intraspinal procedures pending response to present treatment and F/U evaluation   Patient to call [facility] should patient have concerns prior to scheduled return appointment.

## 2016-02-12 ENCOUNTER — Other Ambulatory Visit: Payer: Self-pay | Admitting: Pain Medicine

## 2016-02-17 ENCOUNTER — Ambulatory Visit: Payer: Managed Care, Other (non HMO) | Attending: Pain Medicine | Admitting: Pain Medicine

## 2016-02-17 ENCOUNTER — Encounter: Payer: Self-pay | Admitting: Pain Medicine

## 2016-02-17 VITALS — BP 128/93 | HR 68 | Temp 97.5°F | Resp 18 | Ht 70.0 in | Wt 175.0 lb

## 2016-02-17 DIAGNOSIS — M79602 Pain in left arm: Secondary | ICD-10-CM | POA: Diagnosis present

## 2016-02-17 DIAGNOSIS — M62830 Muscle spasm of back: Secondary | ICD-10-CM | POA: Diagnosis not present

## 2016-02-17 DIAGNOSIS — M5126 Other intervertebral disc displacement, lumbar region: Secondary | ICD-10-CM | POA: Diagnosis not present

## 2016-02-17 DIAGNOSIS — M7061 Trochanteric bursitis, right hip: Secondary | ICD-10-CM

## 2016-02-17 DIAGNOSIS — M48061 Spinal stenosis, lumbar region without neurogenic claudication: Secondary | ICD-10-CM

## 2016-02-17 DIAGNOSIS — M19042 Primary osteoarthritis, left hand: Secondary | ICD-10-CM | POA: Insufficient documentation

## 2016-02-17 DIAGNOSIS — M47816 Spondylosis without myelopathy or radiculopathy, lumbar region: Secondary | ICD-10-CM | POA: Diagnosis not present

## 2016-02-17 DIAGNOSIS — M7062 Trochanteric bursitis, left hip: Secondary | ICD-10-CM

## 2016-02-17 DIAGNOSIS — M47812 Spondylosis without myelopathy or radiculopathy, cervical region: Secondary | ICD-10-CM | POA: Insufficient documentation

## 2016-02-17 DIAGNOSIS — M533 Sacrococcygeal disorders, not elsewhere classified: Secondary | ICD-10-CM | POA: Diagnosis not present

## 2016-02-17 DIAGNOSIS — M5136 Other intervertebral disc degeneration, lumbar region: Secondary | ICD-10-CM | POA: Insufficient documentation

## 2016-02-17 DIAGNOSIS — M542 Cervicalgia: Secondary | ICD-10-CM | POA: Diagnosis present

## 2016-02-17 DIAGNOSIS — M5481 Occipital neuralgia: Secondary | ICD-10-CM

## 2016-02-17 DIAGNOSIS — M19041 Primary osteoarthritis, right hand: Secondary | ICD-10-CM | POA: Diagnosis not present

## 2016-02-17 DIAGNOSIS — M778 Other enthesopathies, not elsewhere classified: Secondary | ICD-10-CM | POA: Insufficient documentation

## 2016-02-17 DIAGNOSIS — M461 Sacroiliitis, not elsewhere classified: Secondary | ICD-10-CM | POA: Diagnosis not present

## 2016-02-17 DIAGNOSIS — Z981 Arthrodesis status: Secondary | ICD-10-CM | POA: Insufficient documentation

## 2016-02-17 DIAGNOSIS — M79601 Pain in right arm: Secondary | ICD-10-CM | POA: Diagnosis present

## 2016-02-17 DIAGNOSIS — M5030 Other cervical disc degeneration, unspecified cervical region: Secondary | ICD-10-CM

## 2016-02-17 DIAGNOSIS — M47817 Spondylosis without myelopathy or radiculopathy, lumbosacral region: Secondary | ICD-10-CM

## 2016-02-17 MED ORDER — GABAPENTIN 600 MG PO TABS: ORAL_TABLET | ORAL | 2 refills | Status: DC

## 2016-02-17 MED ORDER — METHADONE HCL 5 MG PO TABS: ORAL_TABLET | ORAL | 0 refills | Status: DC

## 2016-02-17 MED ORDER — TIZANIDINE HCL 2 MG PO TABS: ORAL_TABLET | ORAL | 2 refills | Status: DC

## 2016-02-17 NOTE — Patient Instructions (Addendum)
PLAN   Continue present medication Cymbalta Neurontin Zanaflex and methadone Take vitamin B complex since insurance will not approve Metanx  F/U PCP  Dr. Geoffry Paradise for evaliation of  BP and general medical  condition and discuss rheumatological evaluation as well as we previously discussed   F/U surgical evaluation. May consider pending follow-up evaluations Patient will follow-up with Dr.Haglund as needed   F/U neurological evaluation. May consider PNCV/EMG studies and other studies pending follow-up evaluations  Appointment with Dr. Veronia Beets for reevaluation as needed. Patient will wear splints and straps as per Dr. Veronia Beets and exercise as discussed with Dr. Veronia Beets  May consider radiofrequency rhizolysis or intraspinal procedures pending response to present treatment and F/U evaluation   Patient to call [facility] should patient have concerns prior to scheduled return appointment.

## 2016-02-17 NOTE — Progress Notes (Signed)
The patient is a 55 year old female who returns to pain management for further evaluation and treatment of pain involving the neck upper extremity regions lower back and lower extremity region. The patient has had some weakness of the lower extremities and we have consider performing lumbar epidural steroid injection. The patient will call and inform us if she wishes to proceed with lumbar epidural steroid injection for lower back lower extremity pain and weakness. At the present time patient is status post evaluation of her hands by Dr.Grammig . The patient is without plans for surgical intervention of the hands. The patient did discuss injections being performed in the region of the hands at time of her surgical evaluation. At the present time we will avoid interventional treatment and will continue presently prescribed medications. The patient is to call pain management should she wishes to proceed with interventional treatment of lumbar lower extremity pain and will follow-up with Dr.Grammig for further evaluation of the upper extremities as discussed at the present time patient will wear splints as well as apply straps to the upper extremities and will performed exercises as she has been instructed to do so by Dr.Grammig . All agreed to suggested treatment plan     Physical examination  There was tenderness over the cervical facet cervical paraspinal muscular region with well-healed surgical scar of the cervical region without increased warmth and erythema in the region of the scar. The patient appeared to be with moderate tenderness of the splenius capitis and occipitalis regions. There was limited range of motion of the cervical spine with unremarkable Spurling's maneuver. Patient was able to perform drop test with moderate difficulty. The patient was with decreased grip strength and Tinel and Phalen's maneuver reproduced moderate to moderately severe discomfort. There was no increased warmth or  erythema of the hands are upper extremities with mild to moderate tenderness of the epicondyles of the left and right elbows. Palpation over the thoracic region was with no crepitus of the thoracic region and was with moderate muscle spasm noted. Palpation over the lumbar paraspinal musculature region lumbar facet region was with moderate to moderately severe discomfort with lateral bending rotation extension and palpation over the lumbar facets reproducing moderate to moderately severe discomfort. Palpation of the PSIS and PII S region was with moderate tenderness to palpation and. Leg raise was tolerates approximately 20 without a definite increased pain with dorsiflexion noted. Knees were attends to palpation in crepitus of the knees with negative anterior and posterior drawer signs without ballottement of the patella with no increased warmth and erythema in the region of the knees. There was no sensory deficit or dermatomal distribution detected. There was negative clonus negative Homans. Abdomen nontender with no costovertebral tenderness noted.     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 Geoffry Paradise for evaliation of  BP and general medical  condition and discuss rheumatological evaluation as well as we  previously discussed   F/U surgical evaluation.   Patient will follow-up with Dr.Haglund as needed for neurosurgical reevaluation  F/U neurological evaluation. May consider PNCV/EMG studies and other studies pending follow-up evaluations  Appointment with Dr. Veronia Beets for reevaluation as needed. Patient will wear splints and straps as per Dr. Veronia Beets and exercise as discussed with Dr. Veronia Beets  May consider radiofrequency rhizolysis or intraspinal procedures pending response to present treatment and F/U evaluation   Patient to call [facility] should patient have concerns prior to scheduled return appointment.

## 2016-03-09 ENCOUNTER — Telehealth: Payer: Self-pay | Admitting: *Deleted

## 2016-03-23 ENCOUNTER — Ambulatory Visit: Payer: Managed Care, Other (non HMO) | Admitting: Pain Medicine

## 2016-03-29 ENCOUNTER — Other Ambulatory Visit: Payer: Self-pay | Admitting: Pain Medicine

## 2016-03-30 DIAGNOSIS — M791 Myalgia: Secondary | ICD-10-CM | POA: Diagnosis not present

## 2016-03-30 DIAGNOSIS — M47817 Spondylosis without myelopathy or radiculopathy, lumbosacral region: Secondary | ICD-10-CM | POA: Diagnosis not present

## 2016-03-30 DIAGNOSIS — M533 Sacrococcygeal disorders, not elsewhere classified: Secondary | ICD-10-CM | POA: Diagnosis not present

## 2016-03-30 DIAGNOSIS — M5416 Radiculopathy, lumbar region: Secondary | ICD-10-CM | POA: Diagnosis not present

## 2016-04-01 ENCOUNTER — Other Ambulatory Visit: Payer: Self-pay | Admitting: Family Medicine

## 2016-04-15 ENCOUNTER — Encounter: Payer: Self-pay | Admitting: Obstetrics and Gynecology

## 2016-04-15 ENCOUNTER — Ambulatory Visit (INDEPENDENT_AMBULATORY_CARE_PROVIDER_SITE_OTHER): Payer: Medicare Other | Admitting: Obstetrics and Gynecology

## 2016-04-15 VITALS — BP 162/90 | HR 98 | Ht 70.0 in | Wt 195.0 lb

## 2016-04-15 DIAGNOSIS — N6019 Diffuse cystic mastopathy of unspecified breast: Secondary | ICD-10-CM

## 2016-04-15 NOTE — Progress Notes (Signed)
Subjective:     Patient ID: Jamie Kim, female   DOB: 28-Jan-1961, 55 y.o.   MRN: [id number]  HPI Reports new onset breast lump that is somewhat painful 3 weeks ago. Has not changed in size and reports h/o fibrocystic breast and multiple benign cyst in past. Outer portion of right breast. Is also past due for follow up diagnostic mammogram.   Review of Systems Negative except stated in HPI    Objective:   Physical Exam A&O x4 Well groomed female in no distress Blood pressure (!) 162/90, pulse 98, height 5\' 10"  (1.778 m), weight 195 lb (88.5 kg). Breasts: symmetric fibrous changes in both upper outer quadrants. Slightly tender in upper outer right breast quadrant. No nipple discharge or skin changes noted.    Assessment:     Fibrocystic bilateral breast changes, new area noted in RUO quadrant per patient    Plan:     Reassured patient of findings today c/w previous exams, will order due MMG and bilateral u/s and follow up accordingly. RTC as needed.  Melody [place], CNM

## 2016-04-27 ENCOUNTER — Other Ambulatory Visit: Payer: Self-pay | Admitting: Obstetrics and Gynecology

## 2016-04-27 ENCOUNTER — Ambulatory Visit
Admission: RE | Admit: 2016-04-27 | Discharge: 2016-04-27 | Disposition: A | Discharge status: Home / Self Care | Payer: Medicare Other | Source: Ambulatory Visit | Attending: Obstetrics and Gynecology | Admitting: Obstetrics and Gynecology

## 2016-04-27 ENCOUNTER — Other Ambulatory Visit: Payer: Self-pay | Admitting: Family Medicine

## 2016-04-27 DIAGNOSIS — N6019 Diffuse cystic mastopathy of unspecified breast: Secondary | ICD-10-CM | POA: Insufficient documentation

## 2016-04-27 DIAGNOSIS — N644 Mastodynia: Secondary | ICD-10-CM | POA: Diagnosis not present

## 2016-05-02 ENCOUNTER — Other Ambulatory Visit: Payer: Self-pay | Admitting: Pain Medicine

## 2016-05-03 DIAGNOSIS — M533 Sacrococcygeal disorders, not elsewhere classified: Secondary | ICD-10-CM | POA: Diagnosis not present

## 2016-05-03 DIAGNOSIS — M5416 Radiculopathy, lumbar region: Secondary | ICD-10-CM | POA: Diagnosis not present

## 2016-05-03 DIAGNOSIS — M791 Myalgia: Secondary | ICD-10-CM | POA: Diagnosis not present

## 2016-05-03 DIAGNOSIS — M47817 Spondylosis without myelopathy or radiculopathy, lumbosacral region: Secondary | ICD-10-CM | POA: Diagnosis not present

## 2016-05-25 DIAGNOSIS — M791 Myalgia: Secondary | ICD-10-CM | POA: Diagnosis not present

## 2016-05-25 DIAGNOSIS — M533 Sacrococcygeal disorders, not elsewhere classified: Secondary | ICD-10-CM | POA: Diagnosis not present

## 2016-05-25 DIAGNOSIS — M47817 Spondylosis without myelopathy or radiculopathy, lumbosacral region: Secondary | ICD-10-CM | POA: Diagnosis not present

## 2016-05-25 DIAGNOSIS — M5416 Radiculopathy, lumbar region: Secondary | ICD-10-CM | POA: Diagnosis not present

## 2016-07-07 ENCOUNTER — Other Ambulatory Visit: Payer: Self-pay | Admitting: Pain Medicine

## 2016-07-07 DIAGNOSIS — M47817 Spondylosis without myelopathy or radiculopathy, lumbosacral region: Secondary | ICD-10-CM | POA: Diagnosis not present

## 2016-07-07 DIAGNOSIS — M533 Sacrococcygeal disorders, not elsewhere classified: Secondary | ICD-10-CM | POA: Diagnosis not present

## 2016-07-07 DIAGNOSIS — M791 Myalgia: Secondary | ICD-10-CM | POA: Diagnosis not present

## 2016-07-07 DIAGNOSIS — M5416 Radiculopathy, lumbar region: Secondary | ICD-10-CM | POA: Diagnosis not present

## 2016-08-03 ENCOUNTER — Other Ambulatory Visit: Payer: Self-pay | Admitting: Pain Medicine

## 2016-08-03 DIAGNOSIS — M5416 Radiculopathy, lumbar region: Secondary | ICD-10-CM | POA: Diagnosis not present

## 2016-08-03 DIAGNOSIS — M47817 Spondylosis without myelopathy or radiculopathy, lumbosacral region: Secondary | ICD-10-CM | POA: Diagnosis not present

## 2016-08-03 DIAGNOSIS — M533 Sacrococcygeal disorders, not elsewhere classified: Secondary | ICD-10-CM | POA: Diagnosis not present

## 2016-08-03 DIAGNOSIS — M791 Myalgia: Secondary | ICD-10-CM | POA: Diagnosis not present

## 2016-08-16 DIAGNOSIS — L738 Other specified follicular disorders: Secondary | ICD-10-CM | POA: Diagnosis not present

## 2016-08-16 DIAGNOSIS — B001 Herpesviral vesicular dermatitis: Secondary | ICD-10-CM | POA: Diagnosis not present

## 2016-08-24 DIAGNOSIS — L738 Other specified follicular disorders: Secondary | ICD-10-CM | POA: Diagnosis not present

## 2016-08-24 DIAGNOSIS — B001 Herpesviral vesicular dermatitis: Secondary | ICD-10-CM | POA: Diagnosis not present

## 2016-09-03 ENCOUNTER — Encounter: Payer: Self-pay | Admitting: Family Medicine

## 2016-09-03 ENCOUNTER — Ambulatory Visit (INDEPENDENT_AMBULATORY_CARE_PROVIDER_SITE_OTHER): Payer: Medicare Other | Admitting: Family Medicine

## 2016-09-03 VITALS — BP 120/78 | HR 84 | Ht 70.0 in | Wt 202.0 lb

## 2016-09-03 DIAGNOSIS — E782 Mixed hyperlipidemia: Secondary | ICD-10-CM | POA: Diagnosis not present

## 2016-09-03 DIAGNOSIS — G43C0 Periodic headache syndromes in child or adult, not intractable: Secondary | ICD-10-CM | POA: Diagnosis not present

## 2016-09-03 DIAGNOSIS — E042 Nontoxic multinodular goiter: Secondary | ICD-10-CM | POA: Diagnosis not present

## 2016-09-03 DIAGNOSIS — F339 Major depressive disorder, recurrent, unspecified: Secondary | ICD-10-CM | POA: Diagnosis not present

## 2016-09-03 DIAGNOSIS — E559 Vitamin D deficiency, unspecified: Secondary | ICD-10-CM | POA: Diagnosis not present

## 2016-09-03 DIAGNOSIS — F419 Anxiety disorder, unspecified: Secondary | ICD-10-CM

## 2016-09-03 DIAGNOSIS — Z7689 Persons encountering health services in other specified circumstances: Secondary | ICD-10-CM | POA: Diagnosis not present

## 2016-09-03 DIAGNOSIS — K219 Gastro-esophageal reflux disease without esophagitis: Secondary | ICD-10-CM

## 2016-09-03 DIAGNOSIS — I10 Essential (primary) hypertension: Secondary | ICD-10-CM

## 2016-09-03 MED ORDER — METOPROLOL SUCCINATE ER 100 MG PO TB24: ORAL_TABLET | ORAL | 5 refills | Status: DC

## 2016-09-03 MED ORDER — EZETIMIBE 10 MG PO TABS: 10.0000 mg | ORAL_TABLET | Freq: Every day | ORAL | 5 refills | Status: DC

## 2016-09-03 MED ORDER — TOPIRAMATE 50 MG PO TABS: 50.0000 mg | ORAL_TABLET | Freq: Two times a day (BID) | ORAL | 5 refills | Status: DC

## 2016-09-03 MED ORDER — BUSPIRONE HCL 30 MG PO TABS: 30.0000 mg | ORAL_TABLET | Freq: Two times a day (BID) | ORAL | 5 refills | Status: DC

## 2016-09-03 MED ORDER — BUPROPION HCL ER (XL) 150 MG PO TB24: 150.0000 mg | ORAL_TABLET | Freq: Every morning | ORAL | 5 refills | Status: DC

## 2016-09-03 MED ORDER — ESOMEPRAZOLE MAGNESIUM 40 MG PO CPDR: 40.0000 mg | DELAYED_RELEASE_CAPSULE | ORAL | 5 refills | Status: DC

## 2016-09-03 MED ORDER — ERGOCALCIFEROL 1.25 MG (50000 UT) PO CAPS: 50000.0000 [IU] | ORAL_CAPSULE | ORAL | 5 refills | Status: DC

## 2016-09-03 MED ORDER — SIMVASTATIN 80 MG PO TABS: 80.0000 mg | ORAL_TABLET | Freq: Every day | ORAL | 5 refills | Status: DC

## 2016-09-03 NOTE — Progress Notes (Signed)
Name: Jamie Kim   MRN: [id number]    DOB: 06-01-1961   Date:09/03/2016       Progress Note  Subjective  Chief Complaint  Chief Complaint  Patient presents with  . Establish Care    needs a PCP to take care of B/P and chol.    Hyperlipidemia  This is a chronic problem. The problem is controlled. Recent lipid tests were reviewed and are normal. She has no history of chronic renal disease, diabetes, hypothyroidism, liver disease, obesity or nephrotic syndrome. Factors aggravating her hyperlipidemia include beta blockers. Pertinent negatives include no chest pain, focal sensory loss, focal weakness, leg pain, myalgias or shortness of breath. Current antihyperlipidemic treatment includes ezetimibe and statins. The current treatment provides mild improvement of lipids. There are no compliance problems.  Risk factors for coronary artery disease include dyslipidemia and hypertension.  Depression       The patient presents with depression.  This is a chronic problem.  The current episode started more than 1 year ago. The problem is unchanged.  Associated symptoms include no helplessness, no hopelessness, does not have insomnia, not irritable, no decreased interest, no myalgias, no headaches, not sad and no suicidal ideas.  Past treatments include SNRIs - Serotonin and norepinephrine reuptake inhibitors.  Compliance with treatment is good.  Previous treatment provided no relief relief.  Past medical history includes thyroid problem, chronic illness, anxiety and depression.     Pertinent negatives include no hypothyroidism. Anxiety  Presents for follow-up visit. Symptoms include depressed mood and nervous/anxious behavior. Patient reports no chest pain, dizziness, excessive worry, insomnia, irritability, nausea, palpitations, shortness of breath or suicidal ideas. Symptoms occur occasionally.   Her past medical history is significant for depression.  Hypertension  This is a recurrent problem. The  current episode started more than 1 year ago. The problem is unchanged. The problem is controlled. Associated symptoms include anxiety. Pertinent negatives include no blurred vision, chest pain, headaches, malaise/fatigue, neck pain, orthopnea, palpitations, peripheral edema, PND, shortness of breath or sweats. There are no associated agents to hypertension. Risk factors for coronary artery disease include post-menopausal state, dyslipidemia and smoking/tobacco exposure. Past treatments include beta blockers. The current treatment provides moderate improvement. There are no compliance problems.  There is no history of angina, CAD/MI, CVA, heart failure, left ventricular hypertrophy, PVD or retinopathy. Identifiable causes of hypertension include a thyroid problem. There is no history of chronic renal disease.  Gastroesophageal Reflux  She complains of heartburn. She reports no abdominal pain, no chest pain, no choking, no coughing, no dysphagia, no nausea, no sore throat, no stridor, no tooth decay, no water brash or no wheezing. The current episode started more than 1 year ago. The problem has been waxing and waning. The symptoms are aggravated by medications. Pertinent negatives include no melena or weight loss. She has tried a PPI (nexium 40 bid) for the symptoms. The treatment provided mild relief.  Thyroid Problem  Presents for follow-up visit. Symptoms include anxiety, cold intolerance, depressed mood and heat intolerance. Patient reports no constipation, diarrhea, hair loss, palpitations or weight loss. Symptom course: "off and on" Her past medical history is significant for hyperlipidemia. There is no history of diabetes or heart failure.  Migraine   This is a chronic problem. The pain is located in the bilateral and frontal region. The quality of the pain is described as aching. Pertinent negatives include no abdominal pain, back pain, blurred vision, coughing, dizziness, ear pain, fever, insomnia,  muscle aches, nausea,  neck pain, sore throat, tingling or weight loss. Treatments tried: topamax prophyllaxis. Her past medical history is significant for hypertension. There is no history of obesity.    No problem-specific Assessment & Plan notes found for this encounter.   Past Medical History:  Diagnosis Date  . Allergy   . Anxiety   . Back pain, chronic   . DDD (degenerative disc disease), cervical    lumbar  . Depression   . Fibromyalgia   . Fibromyalgia   . GERD (gastroesophageal reflux disease)   . H/O scarlet fever   . Heart murmur    hx  . Hyperlipidemia   . Hypertension   . Migraines   . Multiple thyroid nodules   . Sleep apnea   . Vitamin D deficiency     Past Surgical History:  Procedure Laterality Date  . ANTERIOR CERVICAL DECOMP/DISCECTOMY FUSION    . BLADDER SUSPENSION    . CARDIAC CATHETERIZATION  11/23/12   [facility];EF 65%  . CARPAL TUNNEL RELEASE Bilateral   . CHOLECYSTECTOMY    . HERNIA REPAIR    . KNEE SURGERY Left   . LAPAROSCOPY    . NECK SURGERY    . NISSEN FUNDOPLICATION    . TONSILLECTOMY    . TOTAL ABDOMINAL HYSTERECTOMY      Family History  Problem Relation Age of Onset  . Heart disease Mother   . Heart attack Mother     x2  . Cancer Father   . Melanoma Father   . Colon cancer Father   . Colon cancer Paternal Grandfather     Social History   Social History  . Marital status: Married    Spouse name: N/A  . Number of children: N/A  . Years of education: N/A   Occupational History  . Not on file.   Social History Main Topics  . Smoking status: Former Smoker    Packs/day: 0.50    Years: 20.00    Types: Cigarettes  . Smokeless tobacco: Never Used  . Alcohol use No     Comment: 1 month  . Drug use: No  . Sexual activity: Not on file   Other Topics Concern  . Not on file   Social History Narrative  . No narrative on file    Allergies  Allergen Reactions  . Codeine Itching  . Hydrocodone Itching  . Morphine Itching   . Morphine And Related   . Oxybutynin Itching  . Oxycodone Itching  . Tramadol-Acetaminophen Itching    Outpatient Medications Prior to Visit  Medication Sig Dispense Refill  . acetaminophen (TYLENOL) 325 MG tablet Take 650 mg by mouth every 6 (six) hours as needed.    . DULoxetine (CYMBALTA) 60 MG capsule Take 1-2 mg by mouth daily. Dr Primus Bravo    . gabapentin (NEURONTIN) 600 MG tablet Take 600 mg by mouth 2 (two) times daily. Dr Primus Bravo    . methadone (DOLOPHINE) 5 MG tablet Limit 2-3 tabs by mouth per day if tolerated (Patient taking differently: Take 5 mg by mouth. Limit 2-3 tabs by mouth per day if tolerated- Dr Primus Bravo) 90 tablet 0  . Multiple Vitamin (MULTI-VITAMINS) TABS Take by mouth.    Marland Kitchen tiZANidine (ZANAFLEX) 2 MG tablet Limit  2-4 tablets by mouth per day tolerated (Patient taking differently: Take 2 mg by mouth once. Limit  2-4 tablets by mouth per day tolerated/ Dr Primus Bravo) 110 tablet 2  . tolterodine (DETROL LA) 4 MG 24 hr capsule Take 1 capsule (4  mg total) by mouth daily. (Patient taking differently: Take 4 mg by mouth daily. Melody Burr) 90 capsule 4  . buPROPion (WELLBUTRIN XL) 150 MG 24 hr tablet Take 150 mg by mouth every morning.  3  . busPIRone (BUSPAR) 30 MG tablet Take 30 mg by mouth 2 (two) times daily.    . ergocalciferol (VITAMIN D2) 50000 UNITS capsule Take 50,000 Units by mouth once a week.    . esomeprazole (NEXIUM) 40 MG capsule Take 40 mg by mouth 2 (two) times daily.    Marland Kitchen ezetimibe (ZETIA) 10 MG tablet Take 10 mg by mouth daily. Reported on 10/15/2015    . metoprolol succinate (TOPROL-XL) 100 MG 24 hr tablet TAKE 1/2 TABLET DAILY FOR HIGH BLOOD PRESSURE  3  . simvastatin (ZOCOR) 80 MG tablet Take 80 mg by mouth at bedtime.    . topiramate (TOPAMAX) 50 MG tablet Take 50 mg by mouth 2 (two) times daily.    . Vitamin D, Ergocalciferol, (DRISDOL) 50000 units CAPS capsule Take by mouth.    Marland Kitchen buPROPion (WELLBUTRIN SR) 150 MG 12 hr tablet Take 150 mg by mouth daily.    Marland Kitchen  buPROPion (WELLBUTRIN XL) 150 MG 24 hr tablet Take by mouth.    . busPIRone (BUSPAR) 30 MG tablet     . ciprofloxacin (CIPRO) 250 MG tablet TAKE 1 TABLET (250 MG TOTAL) BY MOUTH 2 (TWO) TIMES DAILY.    Marland Kitchen doxycycline (VIBRA-TABS) 100 MG tablet Take 1 tablet (100 mg total) by mouth 2 (two) times daily. (Patient not taking: Reported on 04/15/2016) 14 tablet Eval  . DULoxetine (CYMBALTA) 60 MG capsule Limit 1-2 capsules per day if tolerated (Patient not taking: Reported on 02/17/2016) 60 capsule 2  . erythromycin base (E-MYCIN) 500 MG tablet TAKE 1 TABLET BY MOUTH 2 TIMES A DAY FOR ACNE FOR 2 WEEKS THEN TAKE AS NEEDED FOR FLARES    . estrogens, conjugated, (PREMARIN) 0.3 MG tablet Take by mouth.    . ezetimibe (ZETIA) 10 MG tablet Take by mouth.    . hydrocortisone 2.5 % cream Apply topically.    . hydrOXYzine (ATARAX/VISTARIL) 25 MG tablet Take 1 or 2 tablets by mouth every 8 hours as needed as needed    . ketoconazole (NIZORAL) 2 % cream Apply topically.    . Multiple Vitamin (MULTIVITAMIN) tablet Take 1 tablet by mouth daily.    . mupirocin ointment (BACTROBAN) 2 % Apply topically.    . promethazine (PHENERGAN) 25 MG tablet Take 25 mg by mouth every 6 (six) hours as needed for nausea.    . promethazine (PHENERGAN) 25 MG tablet Take by mouth.    . sulfamethoxazole-trimethoprim (BACTRIM DS,SEPTRA DS) 800-160 MG tablet Take by mouth.    . tretinoin (RETIN-A) 0.025 % cream Apply 1 application topically at bedtime.    . tretinoin (RETIN-A) 0.025 % cream Apply topically.     Facility-Administered Medications Prior to Visit  Medication Dose Route Frequency Provider Last Rate Last Dose  . bupivacaine (MARCAINE) 0.5 % injection 30 mL  30 mL Other Once Mohammed Kindle, MD      . bupivacaine (PF) (MARCAINE) 0.25 % injection 30 mL  30 mL Other Once Mohammed Kindle, MD      . bupivacaine (PF) (MARCAINE) 0.25 % injection 30 mL  30 mL Other Once Mohammed Kindle, MD      . ciprofloxacin (CIPRO) IVPB 400 mg  400 mg  Intravenous Once Mohammed Kindle, MD      . fentaNYL (SUBLIMAZE) injection  100 mcg  100 mcg Intravenous Once Mohammed Kindle, MD      . fentaNYL (SUBLIMAZE) injection 100 mcg  100 mcg Intravenous Once Mohammed Kindle, MD      . fentaNYL (SUBLIMAZE) injection 100 mcg  100 mcg Intravenous Once Mohammed Kindle, MD      . lactated ringers infusion 1,000 mL  1,000 mL Intravenous Continuous Mohammed Kindle, MD      . lactated ringers infusion 1,000 mL  1,000 mL Intravenous Continuous Mohammed Kindle, MD      . lactated ringers infusion 1,000 mL  1,000 mL Intravenous Continuous Mohammed Kindle, MD      . lactated ringers infusion 1,000 mL  1,000 mL Intravenous Continuous Mohammed Kindle, MD      . lidocaine (PF) (XYLOCAINE) 1 % injection 10 mL  10 mL Subcutaneous Once Mohammed Kindle, MD      . midazolam (VERSED) 5 MG/5ML injection 5 mg  5 mg Intravenous Once Mohammed Kindle, MD      . midazolam (VERSED) 5 MG/5ML injection 5 mg  5 mg Intravenous Once Mohammed Kindle, MD      . midazolam (VERSED) 5 MG/5ML injection 5 mg  5 mg Intravenous Once Mohammed Kindle, MD      . orphenadrine (NORFLEX) injection 60 mg  60 mg Intramuscular Once Mohammed Kindle, MD      . orphenadrine (NORFLEX) injection 60 mg  60 mg Intramuscular Once Mohammed Kindle, MD      . orphenadrine (NORFLEX) injection 60 mg  60 mg Intramuscular Once Mohammed Kindle, MD      . sodium chloride 0.9 % injection 20 mL  20 mL Other Once Mohammed Kindle, MD      . triamcinolone acetonide (KENALOG-40) injection 40 mg  40 mg Other Once Mohammed Kindle, MD      . triamcinolone acetonide (KENALOG-40) injection 40 mg  40 mg Other Once Mohammed Kindle, MD      . triamcinolone acetonide (KENALOG-40) injection 40 mg  40 mg Other Once Mohammed Kindle, MD        Review of Systems  Constitutional: Negative for chills, fever, irritability, malaise/fatigue and weight loss.  HENT: Negative for ear discharge, ear pain and sore throat.   Eyes: Negative for blurred vision.  Respiratory: Negative  for cough, sputum production, choking, shortness of breath and wheezing.   Cardiovascular: Negative for chest pain, palpitations, orthopnea, leg swelling and PND.  Gastrointestinal: Positive for heartburn. Negative for abdominal pain, blood in stool, constipation, diarrhea, dysphagia, melena and nausea.  Genitourinary: Negative for dysuria, frequency, hematuria and urgency.  Musculoskeletal: Negative for back pain, joint pain, myalgias and neck pain.  Skin: Negative for rash.  Neurological: Negative for dizziness, tingling, sensory change, focal weakness and headaches.  Endo/Heme/Allergies: Positive for cold intolerance and heat intolerance. Negative for environmental allergies and polydipsia. Does not bruise/bleed easily.  Psychiatric/Behavioral: Positive for depression. Negative for suicidal ideas. The patient is nervous/anxious. The patient does not have insomnia.      Objective  Vitals:   09/03/16 1434  BP: 120/78  Pulse: 84  Weight: 202 lb (91.6 kg)  Height: 5\' 10"  (1.778 m)    Physical Exam  Constitutional: She is well-developed, well-nourished, and in no distress. She is not irritable. No distress.  HENT:  Head: Normocephalic and atraumatic.  Right Ear: External ear normal.  Left Ear: External ear normal.  Nose: Nose normal.  Mouth/Throat: Oropharynx is clear and moist.  Eyes: Conjunctivae and EOM are normal. Pupils are equal, round, and  reactive to light. Right eye exhibits no discharge. Left eye exhibits no discharge.  Neck: Normal range of motion. Neck supple. No JVD present. No thyromegaly present.  Cardiovascular: Normal rate, regular rhythm, normal heart sounds and intact distal pulses.  Exam reveals no gallop and no friction rub.   No murmur heard. Pulmonary/Chest: Effort normal and breath sounds normal. She has no wheezes. She has no rales.  Abdominal: Soft. Bowel sounds are normal. She exhibits no mass. There is no tenderness. There is no guarding.   Musculoskeletal: Normal range of motion. She exhibits no edema.  Lymphadenopathy:    She has no cervical adenopathy.  Neurological: She is alert. She has normal reflexes.  Skin: Skin is warm and dry. She is not diaphoretic.  Psychiatric: Mood and affect normal.  Nursing note and vitals reviewed.     Assessment & Plan  Problem List Items Addressed This Visit      Cardiovascular and Mediastinum   Essential hypertension   Relevant Medications   ezetimibe (ZETIA) 10 MG tablet   metoprolol succinate (TOPROL-XL) 100 MG 24 hr tablet   simvastatin (ZOCOR) 80 MG tablet   Other Relevant Orders   Renal Function Panel   Periodic headache syndrome, not intractable   Relevant Medications   ezetimibe (ZETIA) 10 MG tablet   buPROPion (WELLBUTRIN XL) 150 MG 24 hr tablet   metoprolol succinate (TOPROL-XL) 100 MG 24 hr tablet   simvastatin (ZOCOR) 80 MG tablet   topiramate (TOPAMAX) 50 MG tablet     Digestive   Gastroesophageal reflux disease   Relevant Medications   esomeprazole (NEXIUM) 40 MG capsule     Endocrine   Multiple thyroid nodules   Relevant Medications   metoprolol succinate (TOPROL-XL) 100 MG 24 hr tablet   Other Relevant Orders   TSH     Other   Hyperlipidemia   Relevant Medications   ezetimibe (ZETIA) 10 MG tablet   metoprolol succinate (TOPROL-XL) 100 MG 24 hr tablet   simvastatin (ZOCOR) 80 MG tablet   Other Relevant Orders   Lipid Profile   Vitamin D deficiency   Relevant Medications   ergocalciferol (VITAMIN D2) 50000 units capsule   Chronic anxiety   Relevant Medications   busPIRone (BUSPAR) 30 MG tablet   buPROPion (WELLBUTRIN XL) 150 MG 24 hr tablet   Depression, recurrent (HCC)   Relevant Medications   busPIRone (BUSPAR) 30 MG tablet   buPROPion (WELLBUTRIN XL) 150 MG 24 hr tablet    Other Visit Diagnoses    Establishing care with new doctor, encounter for    -  Primary      Meds ordered this encounter  Medications  . ezetimibe (ZETIA) 10  MG tablet    Sig: Take 1 tablet (10 mg total) by mouth daily. Reported on 10/15/2015    Dispense:  30 tablet    Refill:  5  . esomeprazole (NEXIUM) 40 MG capsule    Sig: Take 1 capsule (40 mg total) by mouth 1 day or 1 dose.    Dispense:  30 capsule    Refill:  5  . ergocalciferol (VITAMIN D2) 50000 units capsule    Sig: Take 1 capsule (50,000 Units total) by mouth once a week.    Dispense:  4 capsule    Refill:  5  . busPIRone (BUSPAR) 30 MG tablet    Sig: Take 1 tablet (30 mg total) by mouth 2 (two) times daily.    Dispense:  60 tablet    Refill:  5  . buPROPion (WELLBUTRIN XL) 150 MG 24 hr tablet    Sig: Take 1 tablet (150 mg total) by mouth every morning.    Dispense:  90 tablet    Refill:  5  . metoprolol succinate (TOPROL-XL) 100 MG 24 hr tablet    Sig: TAKE 1/2 TABLET DAILY FOR HIGH BLOOD PRESSURE    Dispense:  45 tablet    Refill:  5  . simvastatin (ZOCOR) 80 MG tablet    Sig: Take 1 tablet (80 mg total) by mouth at bedtime.    Dispense:  30 tablet    Refill:  5  . topiramate (TOPAMAX) 50 MG tablet    Sig: Take 1 tablet (50 mg total) by mouth 2 (two) times daily.    Dispense:  60 tablet    Refill:  5   More than 50 % of the time was spent counseling the patient   Dr. Otilio Miu [facility] [place]  09/03/16

## 2016-09-04 LAB — LIPID PANEL
CHOLESTEROL TOTAL: 175 mg/dL (ref 100–199)
Chol/HDL Ratio: 2.6 ratio units (ref 0.0–4.4)
HDL: 67 mg/dL (ref >39)
LDL CALC: 80 mg/dL (ref 0–99)
Triglycerides: 138 mg/dL (ref 0–149)
VLDL CHOLESTEROL CAL: 28 mg/dL (ref 5–40)

## 2016-09-04 LAB — RENAL FUNCTION PANEL
Albumin: 4.6 g/dL (ref 3.5–5.5)
BUN / CREAT RATIO: 12 (ref 9–23)
BUN: 8 mg/dL (ref 6–24)
CALCIUM: 9.7 mg/dL (ref 8.7–10.2)
CO2: 25 mmol/L (ref 18–29)
CREATININE: 0.68 mg/dL (ref 0.57–1.00)
Chloride: 100 mmol/L (ref 96–106)
GFR calc Af Amer: 114 mL/min/{1.73_m2} (ref >59)
GFR, EST NON AFRICAN AMERICAN: 99 mL/min/{1.73_m2} (ref >59)
Glucose: 102 mg/dL — ABNORMAL HIGH (ref 65–99)
PHOSPHORUS: 5.1 mg/dL — AB (ref 2.5–4.5)
Potassium: 4.7 mmol/L (ref 3.5–5.2)
SODIUM: 140 mmol/L (ref 134–144)

## 2016-09-04 LAB — TSH: TSH: 1.97 u[IU]/mL (ref 0.450–4.500)

## 2016-09-06 DIAGNOSIS — M47817 Spondylosis without myelopathy or radiculopathy, lumbosacral region: Secondary | ICD-10-CM | POA: Diagnosis not present

## 2016-09-06 DIAGNOSIS — Z79891 Long term (current) use of opiate analgesic: Secondary | ICD-10-CM | POA: Diagnosis not present

## 2016-09-06 DIAGNOSIS — M5416 Radiculopathy, lumbar region: Secondary | ICD-10-CM | POA: Diagnosis not present

## 2016-09-06 DIAGNOSIS — G894 Chronic pain syndrome: Secondary | ICD-10-CM | POA: Diagnosis not present

## 2016-09-06 DIAGNOSIS — M791 Myalgia: Secondary | ICD-10-CM | POA: Diagnosis not present

## 2016-09-06 DIAGNOSIS — M533 Sacrococcygeal disorders, not elsewhere classified: Secondary | ICD-10-CM | POA: Diagnosis not present

## 2016-09-10 ENCOUNTER — Other Ambulatory Visit: Payer: Self-pay | Admitting: Pain Medicine

## 2016-10-06 DIAGNOSIS — M7061 Trochanteric bursitis, right hip: Secondary | ICD-10-CM | POA: Diagnosis not present

## 2016-10-06 DIAGNOSIS — M533 Sacrococcygeal disorders, not elsewhere classified: Secondary | ICD-10-CM | POA: Diagnosis not present

## 2016-10-06 DIAGNOSIS — M542 Cervicalgia: Secondary | ICD-10-CM | POA: Diagnosis not present

## 2016-10-06 DIAGNOSIS — M47817 Spondylosis without myelopathy or radiculopathy, lumbosacral region: Secondary | ICD-10-CM | POA: Diagnosis not present

## 2016-10-06 DIAGNOSIS — M5033 Other cervical disc degeneration, cervicothoracic region: Secondary | ICD-10-CM | POA: Diagnosis not present

## 2016-10-06 DIAGNOSIS — M791 Myalgia: Secondary | ICD-10-CM | POA: Diagnosis not present

## 2016-10-06 DIAGNOSIS — Z79891 Long term (current) use of opiate analgesic: Secondary | ICD-10-CM | POA: Diagnosis not present

## 2016-10-06 DIAGNOSIS — M5136 Other intervertebral disc degeneration, lumbar region: Secondary | ICD-10-CM | POA: Diagnosis not present

## 2016-10-06 DIAGNOSIS — M47816 Spondylosis without myelopathy or radiculopathy, lumbar region: Secondary | ICD-10-CM | POA: Diagnosis not present

## 2016-10-06 DIAGNOSIS — M5416 Radiculopathy, lumbar region: Secondary | ICD-10-CM | POA: Diagnosis not present

## 2016-10-06 DIAGNOSIS — M545 Low back pain: Secondary | ICD-10-CM | POA: Diagnosis not present

## 2016-10-06 DIAGNOSIS — M5031 Other cervical disc degeneration,  high cervical region: Secondary | ICD-10-CM | POA: Diagnosis not present

## 2016-11-01 DIAGNOSIS — Z79891 Long term (current) use of opiate analgesic: Secondary | ICD-10-CM | POA: Diagnosis not present

## 2016-11-01 DIAGNOSIS — M545 Low back pain: Secondary | ICD-10-CM | POA: Diagnosis not present

## 2016-11-01 DIAGNOSIS — M5416 Radiculopathy, lumbar region: Secondary | ICD-10-CM | POA: Diagnosis not present

## 2016-11-01 DIAGNOSIS — M5031 Other cervical disc degeneration,  high cervical region: Secondary | ICD-10-CM | POA: Diagnosis not present

## 2016-11-01 DIAGNOSIS — M533 Sacrococcygeal disorders, not elsewhere classified: Secondary | ICD-10-CM | POA: Diagnosis not present

## 2016-11-01 DIAGNOSIS — M5136 Other intervertebral disc degeneration, lumbar region: Secondary | ICD-10-CM | POA: Diagnosis not present

## 2016-11-01 DIAGNOSIS — M5033 Other cervical disc degeneration, cervicothoracic region: Secondary | ICD-10-CM | POA: Diagnosis not present

## 2016-11-01 DIAGNOSIS — M7061 Trochanteric bursitis, right hip: Secondary | ICD-10-CM | POA: Diagnosis not present

## 2016-11-01 DIAGNOSIS — M47816 Spondylosis without myelopathy or radiculopathy, lumbar region: Secondary | ICD-10-CM | POA: Diagnosis not present

## 2016-11-01 DIAGNOSIS — M47817 Spondylosis without myelopathy or radiculopathy, lumbosacral region: Secondary | ICD-10-CM | POA: Diagnosis not present

## 2016-11-01 DIAGNOSIS — M791 Myalgia: Secondary | ICD-10-CM | POA: Diagnosis not present

## 2016-11-01 DIAGNOSIS — M542 Cervicalgia: Secondary | ICD-10-CM | POA: Diagnosis not present

## 2016-11-02 ENCOUNTER — Encounter: Payer: Managed Care, Other (non HMO) | Admitting: Obstetrics and Gynecology

## 2016-11-05 ENCOUNTER — Other Ambulatory Visit: Payer: Self-pay | Admitting: Obstetrics and Gynecology

## 2016-12-01 DIAGNOSIS — M791 Myalgia: Secondary | ICD-10-CM | POA: Diagnosis not present

## 2016-12-01 DIAGNOSIS — M533 Sacrococcygeal disorders, not elsewhere classified: Secondary | ICD-10-CM | POA: Diagnosis not present

## 2016-12-01 DIAGNOSIS — M5416 Radiculopathy, lumbar region: Secondary | ICD-10-CM | POA: Diagnosis not present

## 2016-12-01 DIAGNOSIS — M47817 Spondylosis without myelopathy or radiculopathy, lumbosacral region: Secondary | ICD-10-CM | POA: Diagnosis not present

## 2016-12-20 ENCOUNTER — Other Ambulatory Visit: Payer: Self-pay

## 2016-12-23 ENCOUNTER — Other Ambulatory Visit: Payer: Self-pay

## 2017-01-03 DIAGNOSIS — M5416 Radiculopathy, lumbar region: Secondary | ICD-10-CM | POA: Diagnosis not present

## 2017-01-03 DIAGNOSIS — M542 Cervicalgia: Secondary | ICD-10-CM | POA: Diagnosis not present

## 2017-01-03 DIAGNOSIS — M47817 Spondylosis without myelopathy or radiculopathy, lumbosacral region: Secondary | ICD-10-CM | POA: Diagnosis not present

## 2017-01-03 DIAGNOSIS — M5033 Other cervical disc degeneration, cervicothoracic region: Secondary | ICD-10-CM | POA: Diagnosis not present

## 2017-01-03 DIAGNOSIS — M48061 Spinal stenosis, lumbar region without neurogenic claudication: Secondary | ICD-10-CM | POA: Diagnosis not present

## 2017-01-03 DIAGNOSIS — M5136 Other intervertebral disc degeneration, lumbar region: Secondary | ICD-10-CM | POA: Diagnosis not present

## 2017-01-03 DIAGNOSIS — M5137 Other intervertebral disc degeneration, lumbosacral region: Secondary | ICD-10-CM | POA: Diagnosis not present

## 2017-01-03 DIAGNOSIS — M791 Myalgia: Secondary | ICD-10-CM | POA: Diagnosis not present

## 2017-01-03 DIAGNOSIS — Z79891 Long term (current) use of opiate analgesic: Secondary | ICD-10-CM | POA: Diagnosis not present

## 2017-01-03 DIAGNOSIS — M5031 Other cervical disc degeneration,  high cervical region: Secondary | ICD-10-CM | POA: Diagnosis not present

## 2017-01-03 DIAGNOSIS — M533 Sacrococcygeal disorders, not elsewhere classified: Secondary | ICD-10-CM | POA: Diagnosis not present

## 2017-01-03 DIAGNOSIS — M47816 Spondylosis without myelopathy or radiculopathy, lumbar region: Secondary | ICD-10-CM | POA: Diagnosis not present

## 2017-01-06 ENCOUNTER — Encounter: Payer: Self-pay | Admitting: Obstetrics and Gynecology

## 2017-01-06 ENCOUNTER — Ambulatory Visit (INDEPENDENT_AMBULATORY_CARE_PROVIDER_SITE_OTHER): Payer: Medicare Other | Admitting: Obstetrics and Gynecology

## 2017-01-06 VITALS — BP 138/86 | HR 98 | Ht 70.0 in | Wt 205.5 lb

## 2017-01-06 DIAGNOSIS — Z01419 Encounter for gynecological examination (general) (routine) without abnormal findings: Secondary | ICD-10-CM

## 2017-01-06 NOTE — Progress Notes (Signed)
Subjective:   Jamie Kim is a 56 y.o. No obstetric history on file. Caucasian female here for a routine well-woman exam.  No LMP recorded. Patient has had a hysterectomy.    Current complaints: none PCP: Ronnald Ramp       Doesn't need labs  Social History: Sexual: heterosexual Marital Status: married Living situation: with family Occupation: homemaker Tobacco/alcohol: occasional smoker Illicit drugs: no history of illicit drug use  The following portions of the patient's history were reviewed and updated as appropriate: allergies, current medications, past family history, past medical history, past social history, past surgical history and problem list.  Past Medical History Past Medical History:  Diagnosis Date  . Allergy   . Anxiety   . Back pain, chronic   . DDD (degenerative disc disease), cervical    lumbar  . Depression   . Fibromyalgia   . Fibromyalgia   . GERD (gastroesophageal reflux disease)   . H/O scarlet fever   . Heart murmur    hx  . Hyperlipidemia   . Hypertension   . Migraines   . Multiple thyroid nodules   . Sleep apnea   . Vitamin D deficiency     Past Surgical History Past Surgical History:  Procedure Laterality Date  . ANTERIOR CERVICAL DECOMP/DISCECTOMY FUSION    . BLADDER SUSPENSION    . CARDIAC CATHETERIZATION  11/23/12   [facility];EF 65%  . CARPAL TUNNEL RELEASE Bilateral   . CHOLECYSTECTOMY    . HERNIA REPAIR    . KNEE SURGERY Left   . LAPAROSCOPY    . NECK SURGERY    . NISSEN FUNDOPLICATION    . TONSILLECTOMY    . TOTAL ABDOMINAL HYSTERECTOMY      Gynecologic History No obstetric history on file.  No LMP recorded. Patient has had a hysterectomy. Contraception: status post hysterectomy Last Pap: 2016. Results were: normal Last mammogram: 2017. Results were: normal  Obstetric History OB History  No data available    Current Medications Current Outpatient Prescriptions on File Prior to Visit  Medication Sig Dispense Refill   . acetaminophen (TYLENOL) 325 MG tablet Take 650 mg by mouth every 6 (six) hours as needed.    Marland Kitchen buPROPion (WELLBUTRIN XL) 150 MG 24 hr tablet Take 1 tablet (150 mg total) by mouth every morning. 90 tablet 5  . busPIRone (BUSPAR) 30 MG tablet Take 1 tablet (30 mg total) by mouth 2 (two) times daily. 60 tablet 5  . DULoxetine (CYMBALTA) 60 MG capsule Take 1-2 mg by mouth daily. Dr Primus Bravo    . ergocalciferol (VITAMIN D2) 50000 units capsule Take 1 capsule (50,000 Units total) by mouth once a week. 4 capsule 5  . ezetimibe (ZETIA) 10 MG tablet Take 1 tablet (10 mg total) by mouth daily. Reported on 10/15/2015 30 tablet 5  . gabapentin (NEURONTIN) 600 MG tablet Take 600 mg by mouth 2 (two) times daily. Dr Primus Bravo    . methadone (DOLOPHINE) 5 MG tablet Limit 2-3 tabs by mouth per day if tolerated (Patient taking differently: Take 5 mg by mouth. Limit 2-3 tabs by mouth per day if tolerated- Dr Primus Bravo) 90 tablet 0  . metoprolol succinate (TOPROL-XL) 100 MG 24 hr tablet TAKE 1/2 TABLET DAILY FOR HIGH BLOOD PRESSURE 45 tablet 5  . Multiple Vitamin (MULTI-VITAMINS) TABS Take by mouth.    . simvastatin (ZOCOR) 80 MG tablet Take 1 tablet (80 mg total) by mouth at bedtime. 30 tablet 5  . tiZANidine (ZANAFLEX) 2 MG tablet Limit  2-4 tablets by mouth per day tolerated (Patient taking differently: Take 2 mg by mouth once. Limit  2-4 tablets by mouth per day tolerated/ Dr Primus Bravo) 110 tablet 2  . tolterodine (DETROL LA) 4 MG 24 hr capsule TAKE 1 CAPSULE (4 MG TOTAL) BY MOUTH DAILY. 90 capsule 3  . topiramate (TOPAMAX) 50 MG tablet Take 1 tablet (50 mg total) by mouth 2 (two) times daily. 60 tablet 5  . esomeprazole (NEXIUM) 40 MG capsule Take 1 capsule (40 mg total) by mouth 1 day or 1 dose. 30 capsule 5   Current Facility-Administered Medications on File Prior to Visit  Medication Dose Route Frequency Provider Last Rate Last Dose  . bupivacaine (MARCAINE) 0.5 % injection 30 mL  30 mL Other Once Mohammed Kindle, MD       . bupivacaine (PF) (MARCAINE) 0.25 % injection 30 mL  30 mL Other Once Mohammed Kindle, MD      . bupivacaine (PF) (MARCAINE) 0.25 % injection 30 mL  30 mL Other Once Mohammed Kindle, MD      . fentaNYL (SUBLIMAZE) injection 100 mcg  100 mcg Intravenous Once Mohammed Kindle, MD      . fentaNYL (SUBLIMAZE) injection 100 mcg  100 mcg Intravenous Once Mohammed Kindle, MD      . fentaNYL (SUBLIMAZE) injection 100 mcg  100 mcg Intravenous Once Mohammed Kindle, MD      . lactated ringers infusion 1,000 mL  1,000 mL Intravenous Continuous Mohammed Kindle, MD      . lactated ringers infusion 1,000 mL  1,000 mL Intravenous Continuous Mohammed Kindle, MD      . lactated ringers infusion 1,000 mL  1,000 mL Intravenous Continuous Mohammed Kindle, MD      . lactated ringers infusion 1,000 mL  1,000 mL Intravenous Continuous Mohammed Kindle, MD      . lidocaine (PF) (XYLOCAINE) 1 % injection 10 mL  10 mL Subcutaneous Once Mohammed Kindle, MD      . midazolam (VERSED) 5 MG/5ML injection 5 mg  5 mg Intravenous Once Mohammed Kindle, MD      . midazolam (VERSED) 5 MG/5ML injection 5 mg  5 mg Intravenous Once Mohammed Kindle, MD      . midazolam (VERSED) 5 MG/5ML injection 5 mg  5 mg Intravenous Once Mohammed Kindle, MD      . orphenadrine (NORFLEX) injection 60 mg  60 mg Intramuscular Once Mohammed Kindle, MD      . orphenadrine (NORFLEX) injection 60 mg  60 mg Intramuscular Once Mohammed Kindle, MD      . orphenadrine (NORFLEX) injection 60 mg  60 mg Intramuscular Once Mohammed Kindle, MD      . sodium chloride 0.9 % injection 20 mL  20 mL Other Once Mohammed Kindle, MD      . triamcinolone acetonide (KENALOG-40) injection 40 mg  40 mg Other Once Mohammed Kindle, MD      . triamcinolone acetonide (KENALOG-40) injection 40 mg  40 mg Other Once Mohammed Kindle, MD      . triamcinolone acetonide (KENALOG-40) injection 40 mg  40 mg Other Once Mohammed Kindle, MD        Review of Systems Patient denies any headaches, blurred vision,  shortness of breath, chest pain, abdominal pain, problems with bowel movements, urination, or intercourse.  Objective:  BP 138/86   Pulse 98   Ht 5\' 10"  (1.778 m)   Wt 205 lb 8 oz (93.2 kg)   BMI 29.49 kg/m  Physical Exam  General:  Well developed, well nourished, no acute distress. She is alert and oriented x3. Skin:  Warm and dry Neck:  Midline trachea, no thyromegaly or nodules Cardiovascular: Regular rate and rhythm, no murmur heard Lungs:  Effort normal, all lung fields clear to auscultation bilaterally Breasts:  No dominant palpable mass, retraction, or nipple discharge Abdomen:  Soft, non tender, no hepatosplenomegaly or masses Pelvic:  External genitalia is normal in appearance.  The vagina is normal in appearance. The cervix is bulbous, no CMT.  Thin prep pap is not done. Uterus is surgically absent. No adnexal masses or tenderness noted. Extremities:  No swelling or varicosities noted Psych:  She has a normal mood and affect  Assessment:   Healthy well-woman exam S/P hysterectomy   Plan:  Labs per PCP every 6 months. F/U 1 year for AE, or sooner if needed   Siraj Dermody Rockney Ghee, CNM

## 2017-01-21 DIAGNOSIS — M180 Bilateral primary osteoarthritis of first carpometacarpal joints: Secondary | ICD-10-CM | POA: Diagnosis not present

## 2017-01-21 DIAGNOSIS — M19041 Primary osteoarthritis, right hand: Secondary | ICD-10-CM | POA: Diagnosis not present

## 2017-01-21 DIAGNOSIS — M79641 Pain in right hand: Secondary | ICD-10-CM | POA: Diagnosis not present

## 2017-01-21 DIAGNOSIS — M25531 Pain in right wrist: Secondary | ICD-10-CM | POA: Diagnosis not present

## 2017-01-21 DIAGNOSIS — M19042 Primary osteoarthritis, left hand: Secondary | ICD-10-CM | POA: Diagnosis not present

## 2017-01-21 DIAGNOSIS — M25522 Pain in left elbow: Secondary | ICD-10-CM | POA: Diagnosis not present

## 2017-02-01 DIAGNOSIS — M791 Myalgia: Secondary | ICD-10-CM | POA: Diagnosis not present

## 2017-02-01 DIAGNOSIS — M533 Sacrococcygeal disorders, not elsewhere classified: Secondary | ICD-10-CM | POA: Diagnosis not present

## 2017-02-01 DIAGNOSIS — M5416 Radiculopathy, lumbar region: Secondary | ICD-10-CM | POA: Diagnosis not present

## 2017-02-01 DIAGNOSIS — M47817 Spondylosis without myelopathy or radiculopathy, lumbosacral region: Secondary | ICD-10-CM | POA: Diagnosis not present

## 2017-03-01 DIAGNOSIS — M5416 Radiculopathy, lumbar region: Secondary | ICD-10-CM | POA: Diagnosis not present

## 2017-03-01 DIAGNOSIS — M533 Sacrococcygeal disorders, not elsewhere classified: Secondary | ICD-10-CM | POA: Diagnosis not present

## 2017-03-01 DIAGNOSIS — M791 Myalgia: Secondary | ICD-10-CM | POA: Diagnosis not present

## 2017-03-01 DIAGNOSIS — M47817 Spondylosis without myelopathy or radiculopathy, lumbosacral region: Secondary | ICD-10-CM | POA: Diagnosis not present

## 2017-03-10 ENCOUNTER — Ambulatory Visit: Payer: Medicare Other | Admitting: Family Medicine

## 2017-03-16 DIAGNOSIS — M5416 Radiculopathy, lumbar region: Secondary | ICD-10-CM | POA: Diagnosis not present

## 2017-03-16 DIAGNOSIS — M47817 Spondylosis without myelopathy or radiculopathy, lumbosacral region: Secondary | ICD-10-CM | POA: Diagnosis not present

## 2017-03-16 DIAGNOSIS — M791 Myalgia: Secondary | ICD-10-CM | POA: Diagnosis not present

## 2017-03-16 DIAGNOSIS — M533 Sacrococcygeal disorders, not elsewhere classified: Secondary | ICD-10-CM | POA: Diagnosis not present

## 2017-03-20 ENCOUNTER — Other Ambulatory Visit: Payer: Self-pay | Admitting: Family Medicine

## 2017-03-20 DIAGNOSIS — G43C0 Periodic headache syndromes in child or adult, not intractable: Secondary | ICD-10-CM

## 2017-03-20 DIAGNOSIS — E782 Mixed hyperlipidemia: Secondary | ICD-10-CM

## 2017-03-20 DIAGNOSIS — K219 Gastro-esophageal reflux disease without esophagitis: Secondary | ICD-10-CM

## 2017-03-29 ENCOUNTER — Ambulatory Visit (INDEPENDENT_AMBULATORY_CARE_PROVIDER_SITE_OTHER): Payer: Medicare Other | Admitting: Family Medicine

## 2017-03-29 ENCOUNTER — Encounter: Payer: Self-pay | Admitting: Family Medicine

## 2017-03-29 VITALS — BP 138/100 | HR 76 | Ht 70.0 in | Wt 204.0 lb

## 2017-03-29 DIAGNOSIS — M4697 Unspecified inflammatory spondylopathy, lumbosacral region: Secondary | ICD-10-CM | POA: Diagnosis not present

## 2017-03-29 DIAGNOSIS — I10 Essential (primary) hypertension: Secondary | ICD-10-CM

## 2017-03-29 DIAGNOSIS — F339 Major depressive disorder, recurrent, unspecified: Secondary | ICD-10-CM | POA: Diagnosis not present

## 2017-03-29 DIAGNOSIS — G43C0 Periodic headache syndromes in child or adult, not intractable: Secondary | ICD-10-CM

## 2017-03-29 DIAGNOSIS — F419 Anxiety disorder, unspecified: Secondary | ICD-10-CM | POA: Diagnosis not present

## 2017-03-29 DIAGNOSIS — M47817 Spondylosis without myelopathy or radiculopathy, lumbosacral region: Secondary | ICD-10-CM

## 2017-03-29 DIAGNOSIS — E782 Mixed hyperlipidemia: Secondary | ICD-10-CM | POA: Diagnosis not present

## 2017-03-29 DIAGNOSIS — K219 Gastro-esophageal reflux disease without esophagitis: Secondary | ICD-10-CM | POA: Diagnosis not present

## 2017-03-29 DIAGNOSIS — Z23 Encounter for immunization: Secondary | ICD-10-CM | POA: Diagnosis not present

## 2017-03-29 MED ORDER — BUSPIRONE HCL 30 MG PO TABS: 30.0000 mg | ORAL_TABLET | Freq: Two times a day (BID) | ORAL | 5 refills | Status: DC

## 2017-03-29 MED ORDER — ESOMEPRAZOLE MAGNESIUM 40 MG PO CPDR: 40.0000 mg | DELAYED_RELEASE_CAPSULE | Freq: Two times a day (BID) | ORAL | 5 refills | Status: DC

## 2017-03-29 MED ORDER — TOPIRAMATE 50 MG PO TABS: 50.0000 mg | ORAL_TABLET | Freq: Two times a day (BID) | ORAL | 5 refills | Status: DC

## 2017-03-29 MED ORDER — EZETIMIBE 10 MG PO TABS: 10.0000 mg | ORAL_TABLET | Freq: Every day | ORAL | 5 refills | Status: DC

## 2017-03-29 MED ORDER — SIMVASTATIN 80 MG PO TABS: 80.0000 mg | ORAL_TABLET | Freq: Every day | ORAL | 5 refills | Status: DC

## 2017-03-29 MED ORDER — BUPROPION HCL ER (XL) 150 MG PO TB24: 150.0000 mg | ORAL_TABLET | Freq: Every morning | ORAL | 5 refills | Status: DC

## 2017-03-29 MED ORDER — METOPROLOL SUCCINATE ER 100 MG PO TB24: 100.0000 mg | ORAL_TABLET | Freq: Every day | ORAL | 1 refills | Status: DC

## 2017-03-29 NOTE — Progress Notes (Signed)
Name: Jamie Kim   MRN: [id number]    DOB: [birth year]   Date:03/29/2017       Progress Note  Subjective  Chief Complaint  Chief Complaint  Patient presents with 56 . Gastroesophageal Reflux  . Hypertension  . Hyperlipidemia  . Migraine    Gastroesophageal Reflux  She reports no abdominal pain, no belching, no chest pain, no choking, no coughing, no dysphagia, no early satiety, no heartburn, no nausea, no sore throat, no stridor or no wheezing. This is a chronic problem. The current episode started more than 1 year ago. The problem occurs rarely ("if I miss a pill"). The problem has been gradually improving. The symptoms are aggravated by certain foods. Pertinent negatives include no anemia, fatigue, melena, muscle weakness, orthopnea or weight loss. There are no known risk factors. She has tried a PPI for the symptoms. The treatment provided moderate relief.  Hypertension  This is a chronic problem. The current episode started more than 1 month ago. The problem is controlled. Pertinent negatives include no anxiety, blurred vision, chest pain, headaches, malaise/fatigue, neck pain, orthopnea, palpitations, peripheral edema, PND, shortness of breath or sweats. There are no associated agents to hypertension. There are no known risk factors for coronary artery disease. Past treatments include beta blockers. The current treatment provides moderate improvement. There is no history of angina, kidney disease, CAD/MI, CVA, heart failure, left ventricular hypertrophy, PVD or retinopathy. There is no history of chronic renal disease, a hypertension causing med or renovascular disease.  Hyperlipidemia  This is a chronic problem. The current episode started more than 1 year ago. The problem is controlled. Recent lipid tests were reviewed and are normal. She has no history of chronic renal disease or obesity. Pertinent negatives include no chest pain, focal weakness, myalgias or shortness of breath.  Current antihyperlipidemic treatment includes statins and ezetimibe. The current treatment provides moderate improvement of lipids. There are no compliance problems.  Risk factors for coronary artery disease include dyslipidemia.  Migraine   This is a chronic problem. The current episode started more than 1 month ago. The problem occurs daily. The problem has been unchanged. The quality of the pain is described as aching. Pertinent negatives include no abdominal pain, back pain, blurred vision, coughing, dizziness, ear pain, fever, insomnia, nausea, neck pain, sore throat, tingling or weight loss. Her past medical history is significant for hypertension. There is no history of cluster headaches, obesity, sinus disease or TMJ.  Depression         The patient presents with no depression.  This is a chronic problem.  The onset quality is gradual.   The problem has been gradually improving since onset.  Associated symptoms include no decreased concentration, no fatigue, no helplessness, no hopelessness, does not have insomnia, not irritable, no restlessness, no decreased interest, no appetite change, no body aches, no myalgias, no headaches, no indigestion, not sad and no suicidal ideas.   Pertinent negatives include no anxiety and no depression. Anxiety  Presents for follow-up visit. Symptoms include nervous/anxious behavior. Patient reports no chest pain, compulsions, confusion, decreased concentration, depressed mood, dizziness, dry mouth, excessive worry, feeling of choking, hyperventilation, impotence, insomnia, irritability, malaise, muscle tension, nausea, obsessions, palpitations, panic, restlessness, shortness of breath or suicidal ideas. Symptoms occur occasionally.   There is no history of depression.    No problem-specific Assessment & Plan notes found for this encounter.   Past Medical History:  Diagnosis Date  . Allergy   . Anxiety   .  Back pain, chronic   . DDD (degenerative disc  disease), cervical    lumbar  . Depression   . Fibromyalgia   . Fibromyalgia   . GERD (gastroesophageal reflux disease)   . H/O scarlet fever   . Heart murmur    hx  . Hyperlipidemia   . Hypertension   . Migraines   . Multiple thyroid nodules   . Sleep apnea   . Vitamin D deficiency     Past Surgical History:  Procedure Laterality Date  . ANTERIOR CERVICAL DECOMP/DISCECTOMY FUSION    . BLADDER SUSPENSION    . CARDIAC CATHETERIZATION  11/23/12   [facility];EF 65%  . CARPAL TUNNEL RELEASE Bilateral   . CHOLECYSTECTOMY    . HERNIA REPAIR    . KNEE SURGERY Left   . LAPAROSCOPY    . NECK SURGERY    . NISSEN FUNDOPLICATION    . TONSILLECTOMY    . TOTAL ABDOMINAL HYSTERECTOMY      Family History  Problem Relation Age of Onset  . Heart disease Mother   . Heart attack Mother        x2  . Cancer Father   . Melanoma Father   . Colon cancer Father   . Colon cancer Paternal Grandfather     Social History   Social History  . Marital status: Married    Spouse name: N/A  . Number of children: N/A  . Years of education: N/A   Occupational History  . Not on file.   Social History Main Topics  . Smoking status: Former Smoker    Packs/day: 0.50    Years: 20.00    Types: Cigarettes  . Smokeless tobacco: Never Used  . Alcohol use No     Comment: 1 month  . Drug use: No  . Sexual activity: Not on file   Other Topics Concern  . Not on file   Social History Narrative  . No narrative on file    Allergies  Allergen Reactions  . Codeine Itching  . Hydrocodone Itching  . Morphine Itching  . Morphine And Related   . Oxybutynin Itching  . Oxycodone Itching  . Tramadol-Acetaminophen Itching    Outpatient Medications Prior to Visit  Medication Sig Dispense Refill  . acetaminophen (TYLENOL) 325 MG tablet Take 650 mg by mouth every 6 (six) hours as needed.    . DULoxetine (CYMBALTA) 60 MG capsule Take 1-2 mg by mouth daily. Dr Primus Bravo    . ergocalciferol (VITAMIN D2)  50000 units capsule Take 1 capsule (50,000 Units total) by mouth once a week. 4 capsule 5  . gabapentin (NEURONTIN) 600 MG tablet Take 600 mg by mouth 2 (two) times daily. Dr Primus Bravo    . methadone (DOLOPHINE) 5 MG tablet Limit 2-3 tabs by mouth per day if tolerated (Patient taking differently: Take 5 mg by mouth. Limit 2-3 tabs by mouth per day if tolerated- Dr Primus Bravo) 90 tablet 0  . Multiple Vitamin (MULTI-VITAMINS) TABS Take by mouth.    Marland Kitchen tiZANidine (ZANAFLEX) 2 MG tablet Limit  2-4 tablets by mouth per day tolerated (Patient taking differently: Take 2 mg by mouth once. Limit  2-4 tablets by mouth per day tolerated/ Dr Primus Bravo) 110 tablet 2  . tolterodine (DETROL LA) 4 MG 24 hr capsule TAKE 1 CAPSULE (4 MG TOTAL) BY MOUTH DAILY. (Patient taking differently: Take 4 mg by mouth daily. Melody Burr) 90 capsule 3  . buPROPion (WELLBUTRIN XL) 150 MG 24 hr tablet Take 1  tablet (150 mg total) by mouth every morning. 90 tablet 5  . busPIRone (BUSPAR) 30 MG tablet Take 1 tablet (30 mg total) by mouth 2 (two) times daily. 60 tablet 5  . esomeprazole (NEXIUM) 40 MG capsule TAKE 1 CAPSULE (40 MG TOTAL) BY MOUTH 1 DAY OR 1 DOSE. (Patient taking differently: Take 40 mg by mouth 2 (two) times daily. ) 30 capsule 0  . ezetimibe (ZETIA) 10 MG tablet Take 1 tablet (10 mg total) by mouth daily. Reported on 10/15/2015 30 tablet 5  . metoprolol succinate (TOPROL-XL) 100 MG 24 hr tablet TAKE 1/2 TABLET DAILY FOR HIGH BLOOD PRESSURE 45 tablet 5  . simvastatin (ZOCOR) 80 MG tablet TAKE 1 TABLET (80 MG TOTAL) BY MOUTH AT BEDTIME. 30 tablet 0  . topiramate (TOPAMAX) 50 MG tablet TAKE 1 TABLET (50 MG TOTAL) BY MOUTH 2 (TWO) TIMES DAILY. 60 tablet 0   Facility-Administered Medications Prior to Visit  Medication Dose Route Frequency Provider Last Rate Last Dose  . bupivacaine (MARCAINE) 0.5 % injection 30 mL  30 mL Other Once Mohammed Kindle, MD      . bupivacaine (PF) (MARCAINE) 0.25 % injection 30 mL  30 mL Other Once Mohammed Kindle, MD      . bupivacaine (PF) (MARCAINE) 0.25 % injection 30 mL  30 mL Other Once Mohammed Kindle, MD      . fentaNYL (SUBLIMAZE) injection 100 mcg  100 mcg Intravenous Once Mohammed Kindle, MD      . fentaNYL (SUBLIMAZE) injection 100 mcg  100 mcg Intravenous Once Mohammed Kindle, MD      . fentaNYL (SUBLIMAZE) injection 100 mcg  100 mcg Intravenous Once Mohammed Kindle, MD      . lactated ringers infusion 1,000 mL  1,000 mL Intravenous Continuous Mohammed Kindle, MD      . lactated ringers infusion 1,000 mL  1,000 mL Intravenous Continuous Mohammed Kindle, MD      . lactated ringers infusion 1,000 mL  1,000 mL Intravenous Continuous Mohammed Kindle, MD      . lactated ringers infusion 1,000 mL  1,000 mL Intravenous Continuous Mohammed Kindle, MD      . lidocaine (PF) (XYLOCAINE) 1 % injection 10 mL  10 mL Subcutaneous Once Mohammed Kindle, MD      . midazolam (VERSED) 5 MG/5ML injection 5 mg  5 mg Intravenous Once Mohammed Kindle, MD      . midazolam (VERSED) 5 MG/5ML injection 5 mg  5 mg Intravenous Once Mohammed Kindle, MD      . midazolam (VERSED) 5 MG/5ML injection 5 mg  5 mg Intravenous Once Mohammed Kindle, MD      . orphenadrine (NORFLEX) injection 60 mg  60 mg Intramuscular Once Mohammed Kindle, MD      . orphenadrine (NORFLEX) injection 60 mg  60 mg Intramuscular Once Mohammed Kindle, MD      . orphenadrine (NORFLEX) injection 60 mg  60 mg Intramuscular Once Mohammed Kindle, MD      . sodium chloride 0.9 % injection 20 mL  20 mL Other Once Mohammed Kindle, MD      . triamcinolone acetonide (KENALOG-40) injection 40 mg  40 mg Other Once Mohammed Kindle, MD      . triamcinolone acetonide (KENALOG-40) injection 40 mg  40 mg Other Once Mohammed Kindle, MD      . triamcinolone acetonide (KENALOG-40) injection 40 mg  40 mg Other Once Mohammed Kindle, MD        Review of Systems  Constitutional: Negative for appetite change, chills, fatigue, fever, irritability, malaise/fatigue and weight loss.  HENT:  Negative for ear discharge, ear pain and sore throat.   Eyes: Negative for blurred vision.  Respiratory: Negative for cough, sputum production, choking, shortness of breath and wheezing.   Cardiovascular: Negative for chest pain, palpitations, orthopnea, leg swelling and PND.  Gastrointestinal: Negative for abdominal pain, blood in stool, constipation, diarrhea, dysphagia, heartburn, melena and nausea.  Genitourinary: Negative for dysuria, frequency, hematuria, impotence and urgency.  Musculoskeletal: Negative for back pain, joint pain, myalgias, muscle weakness and neck pain.  Skin: Negative for rash.  Neurological: Negative for dizziness, tingling, sensory change, focal weakness and headaches.  Endo/Heme/Allergies: Negative for environmental allergies and polydipsia. Does not bruise/bleed easily.  Psychiatric/Behavioral: Positive for depression. Negative for confusion, decreased concentration and suicidal ideas. The patient is nervous/anxious. The patient does not have insomnia.      Objective  Vitals:   03/29/17 1345  BP: (!) 138/100  Pulse: 76  Weight: 204 lb (92.5 kg)  Height: 5\' 10"  (1.778 m)    Physical Exam  Constitutional: She is well-developed, well-nourished, and in no distress. She is not irritable. No distress.  HENT:  Head: Normocephalic and atraumatic.  Right Ear: External ear normal.  Left Ear: External ear normal.  Nose: Nose normal.  Mouth/Throat: Oropharynx is clear and moist.  Eyes: Pupils are equal, round, and reactive to light. Conjunctivae and EOM are normal. Right eye exhibits no discharge. Left eye exhibits no discharge.  Neck: Normal range of motion. Neck supple. No JVD present. No thyromegaly present.  Cardiovascular: Normal rate, regular rhythm, normal heart sounds and intact distal pulses.  Exam reveals no gallop and no friction rub.   No murmur heard. Pulmonary/Chest: Effort normal and breath sounds normal. She has no wheezes. She has no rales.   Abdominal: Soft. Bowel sounds are normal. She exhibits no mass. There is no tenderness. There is no guarding.  Musculoskeletal: Normal range of motion. She exhibits no edema.  Lymphadenopathy:    She has no cervical adenopathy.  Neurological: She is alert. She has normal reflexes.  Skin: Skin is warm and dry. She is not diaphoretic.  Psychiatric: Mood and affect normal.  Nursing note and vitals reviewed.     Assessment & Plan  Problem List Items Addressed This Visit      Cardiovascular and Mediastinum   Essential hypertension - Primary   Relevant Medications   ezetimibe (ZETIA) 10 MG tablet   metoprolol succinate (TOPROL-XL) 100 MG 24 hr tablet   simvastatin (ZOCOR) 80 MG tablet   Other Relevant Orders   Renal Function Panel   Periodic headache syndrome, not intractable   Relevant Medications   buPROPion (WELLBUTRIN XL) 150 MG 24 hr tablet   ezetimibe (ZETIA) 10 MG tablet   metoprolol succinate (TOPROL-XL) 100 MG 24 hr tablet   simvastatin (ZOCOR) 80 MG tablet   topiramate (TOPAMAX) 50 MG tablet     Digestive   Gastroesophageal reflux disease   Relevant Medications   esomeprazole (NEXIUM) 40 MG capsule     Other   Hyperlipidemia   Relevant Medications   ezetimibe (ZETIA) 10 MG tablet   metoprolol succinate (TOPROL-XL) 100 MG 24 hr tablet   simvastatin (ZOCOR) 80 MG tablet   Other Relevant Orders   Lipid Profile   Lumbosacral facet joint syndrome (HCC)   Chronic anxiety   Relevant Medications   buPROPion (WELLBUTRIN XL) 150 MG 24 hr tablet   busPIRone (BUSPAR) 30 MG  tablet   Depression, recurrent (HCC)   Relevant Medications   buPROPion (WELLBUTRIN XL) 150 MG 24 hr tablet   busPIRone (BUSPAR) 30 MG tablet    Other Visit Diagnoses    Need for diphtheria-tetanus-pertussis (Tdap) vaccine       Relevant Orders   Tdap vaccine greater than or equal to 7yo IM (Completed)   Influenza vaccine needed       Relevant Orders   Flu Vaccine QUAD 36+ mos IM (Completed)       Meds ordered this encounter  Medications  . buPROPion (WELLBUTRIN XL) 150 MG 24 hr tablet    Sig: Take 1 tablet (150 mg total) by mouth every morning.    Dispense:  30 tablet    Refill:  5  . busPIRone (BUSPAR) 30 MG tablet    Sig: Take 1 tablet (30 mg total) by mouth 2 (two) times daily.    Dispense:  60 tablet    Refill:  5  . esomeprazole (NEXIUM) 40 MG capsule    Sig: Take 1 capsule (40 mg total) by mouth 2 (two) times daily.    Dispense:  60 capsule    Refill:  5  . ezetimibe (ZETIA) 10 MG tablet    Sig: Take 1 tablet (10 mg total) by mouth daily. Reported on 10/15/2015    Dispense:  30 tablet    Refill:  5  . metoprolol succinate (TOPROL-XL) 100 MG 24 hr tablet    Sig: Take 1 tablet (100 mg total) by mouth daily.    Dispense:  90 tablet    Refill:  1  . simvastatin (ZOCOR) 80 MG tablet    Sig: Take 1 tablet (80 mg total) by mouth at bedtime.    Dispense:  30 tablet    Refill:  5  . topiramate (TOPAMAX) 50 MG tablet    Sig: Take 1 tablet (50 mg total) by mouth 2 (two) times daily.    Dispense:  60 tablet    Refill:  5      Dr. Otilio Miu [facility]  03/29/17

## 2017-03-30 LAB — RENAL FUNCTION PANEL
ALBUMIN: 4.8 g/dL (ref 3.5–5.5)
BUN/Creatinine Ratio: 12 (ref 9–23)
BUN: 9 mg/dL (ref 6–24)
CHLORIDE: 102 mmol/L (ref 96–106)
CO2: 22 mmol/L (ref 20–29)
Calcium: 9.6 mg/dL (ref 8.7–10.2)
Creatinine, Ser: 0.78 mg/dL (ref 0.57–1.00)
GFR calc Af Amer: 99 mL/min/{1.73_m2} (ref >59)
GFR, EST NON AFRICAN AMERICAN: 86 mL/min/{1.73_m2} (ref >59)
GLUCOSE: 99 mg/dL (ref 65–99)
PHOSPHORUS: 4.4 mg/dL (ref 2.5–4.5)
POTASSIUM: 4.7 mmol/L (ref 3.5–5.2)
Sodium: 140 mmol/L (ref 134–144)

## 2017-03-30 LAB — LIPID PANEL
Chol/HDL Ratio: 3.4 ratio (ref 0.0–4.4)
Cholesterol, Total: 198 mg/dL (ref 100–199)
HDL: 59 mg/dL (ref >39)
LDL Calculated: 93 mg/dL (ref 0–99)
Triglycerides: 229 mg/dL — ABNORMAL HIGH (ref 0–149)
VLDL Cholesterol Cal: 46 mg/dL — ABNORMAL HIGH (ref 5–40)

## 2017-04-13 DIAGNOSIS — M5416 Radiculopathy, lumbar region: Secondary | ICD-10-CM | POA: Diagnosis not present

## 2017-04-13 DIAGNOSIS — M5030 Other cervical disc degeneration, unspecified cervical region: Secondary | ICD-10-CM | POA: Diagnosis not present

## 2017-04-13 DIAGNOSIS — M4310 Spondylolisthesis, site unspecified: Secondary | ICD-10-CM | POA: Diagnosis not present

## 2017-04-13 DIAGNOSIS — Z5181 Encounter for therapeutic drug level monitoring: Secondary | ICD-10-CM | POA: Diagnosis not present

## 2017-04-13 DIAGNOSIS — M47817 Spondylosis without myelopathy or radiculopathy, lumbosacral region: Secondary | ICD-10-CM | POA: Diagnosis not present

## 2017-04-13 DIAGNOSIS — Z79891 Long term (current) use of opiate analgesic: Secondary | ICD-10-CM | POA: Diagnosis not present

## 2017-04-13 DIAGNOSIS — G894 Chronic pain syndrome: Secondary | ICD-10-CM | POA: Diagnosis not present

## 2017-04-29 ENCOUNTER — Other Ambulatory Visit: Payer: Self-pay | Admitting: Family Medicine

## 2017-04-29 DIAGNOSIS — E559 Vitamin D deficiency, unspecified: Secondary | ICD-10-CM

## 2017-05-06 ENCOUNTER — Other Ambulatory Visit: Payer: Self-pay | Admitting: Family Medicine

## 2017-05-06 DIAGNOSIS — K219 Gastro-esophageal reflux disease without esophagitis: Secondary | ICD-10-CM

## 2017-05-06 DIAGNOSIS — G43C0 Periodic headache syndromes in child or adult, not intractable: Secondary | ICD-10-CM

## 2017-05-11 DIAGNOSIS — G894 Chronic pain syndrome: Secondary | ICD-10-CM | POA: Diagnosis not present

## 2017-05-11 DIAGNOSIS — Z5181 Encounter for therapeutic drug level monitoring: Secondary | ICD-10-CM | POA: Diagnosis not present

## 2017-05-11 DIAGNOSIS — M4310 Spondylolisthesis, site unspecified: Secondary | ICD-10-CM | POA: Diagnosis not present

## 2017-05-11 DIAGNOSIS — M5030 Other cervical disc degeneration, unspecified cervical region: Secondary | ICD-10-CM | POA: Diagnosis not present

## 2017-06-07 DIAGNOSIS — M4322 Fusion of spine, cervical region: Secondary | ICD-10-CM | POA: Diagnosis not present

## 2017-06-07 DIAGNOSIS — M41112 Juvenile idiopathic scoliosis, cervical region: Secondary | ICD-10-CM | POA: Diagnosis not present

## 2017-06-07 DIAGNOSIS — M1990 Unspecified osteoarthritis, unspecified site: Secondary | ICD-10-CM | POA: Diagnosis not present

## 2017-06-07 DIAGNOSIS — Z5181 Encounter for therapeutic drug level monitoring: Secondary | ICD-10-CM | POA: Diagnosis not present

## 2017-07-13 DIAGNOSIS — M4310 Spondylolisthesis, site unspecified: Secondary | ICD-10-CM | POA: Diagnosis not present

## 2017-07-13 DIAGNOSIS — M1990 Unspecified osteoarthritis, unspecified site: Secondary | ICD-10-CM | POA: Diagnosis not present

## 2017-07-13 DIAGNOSIS — Z5181 Encounter for therapeutic drug level monitoring: Secondary | ICD-10-CM | POA: Diagnosis not present

## 2017-07-13 DIAGNOSIS — M4322 Fusion of spine, cervical region: Secondary | ICD-10-CM | POA: Diagnosis not present

## 2017-07-20 ENCOUNTER — Telehealth: Payer: Self-pay

## 2017-07-20 ENCOUNTER — Other Ambulatory Visit: Payer: Self-pay

## 2017-07-20 ENCOUNTER — Other Ambulatory Visit: Payer: Self-pay | Admitting: Family Medicine

## 2017-07-20 DIAGNOSIS — K219 Gastro-esophageal reflux disease without esophagitis: Secondary | ICD-10-CM

## 2017-07-20 NOTE — Telephone Encounter (Signed)
Pt is a fairly new pt to [place]. Saw 2 times in 2018. She established in March and we saw again towards end of year. A fax came in from Dr Crisp's office wanting a referral for [employer]. I contacted pt, with no answer, before calling insurance company. I contacted [employer] insurance to notify them that we do not refer to pain management. I was transferred 3 times to "another department" without success. I did get to tell one of the people at the insurance company that we don't do referrals to pain management. I also called Dr Ethel Rana office and the after hours nurse answered. She told me that the doctor's will not be in today, but she can give them a message. I said we are responding to a fax. Please pass the message along to Dr Ethel Rana nurse that we do not do referrals to pain clinics. I then called pt again, she didn't answer so I left a message this time. I went into detail explaining that I had called her insurance, called Dr Primus Bravo, and explained to her that we do not send to pain clinic. The process that we would go through is "For example, if you are having back pain, we will send you to ortho and if they see fit, they will send you to pain management."

## 2017-07-21 ENCOUNTER — Telehealth: Payer: Self-pay

## 2017-07-21 ENCOUNTER — Other Ambulatory Visit: Payer: Self-pay

## 2017-07-21 NOTE — Telephone Encounter (Signed)
Pt telling her that if she would like to be evaluated for pain under Dr Ronnald Ramp, she could call the front and schedule appt. Also, explained we had not evaluated for neck pain in the past.

## 2017-07-25 ENCOUNTER — Other Ambulatory Visit: Payer: Self-pay

## 2017-07-29 ENCOUNTER — Encounter: Payer: Self-pay | Admitting: Family Medicine

## 2017-07-29 ENCOUNTER — Ambulatory Visit (INDEPENDENT_AMBULATORY_CARE_PROVIDER_SITE_OTHER): Payer: Medicare HMO | Admitting: Family Medicine

## 2017-07-29 VITALS — BP 140/100 | HR 68 | Ht 70.0 in | Wt 213.0 lb

## 2017-07-29 DIAGNOSIS — M533 Sacrococcygeal disorders, not elsewhere classified: Secondary | ICD-10-CM

## 2017-07-29 DIAGNOSIS — M5136 Other intervertebral disc degeneration, lumbar region: Secondary | ICD-10-CM | POA: Diagnosis not present

## 2017-07-29 DIAGNOSIS — M5030 Other cervical disc degeneration, unspecified cervical region: Secondary | ICD-10-CM | POA: Diagnosis not present

## 2017-07-29 DIAGNOSIS — M51369 Other intervertebral disc degeneration, lumbar region without mention of lumbar back pain or lower extremity pain: Secondary | ICD-10-CM

## 2017-07-29 DIAGNOSIS — M47817 Spondylosis without myelopathy or radiculopathy, lumbosacral region: Secondary | ICD-10-CM

## 2017-07-29 DIAGNOSIS — M7061 Trochanteric bursitis, right hip: Secondary | ICD-10-CM

## 2017-07-29 DIAGNOSIS — M47812 Spondylosis without myelopathy or radiculopathy, cervical region: Secondary | ICD-10-CM | POA: Diagnosis not present

## 2017-07-29 DIAGNOSIS — M7062 Trochanteric bursitis, left hip: Secondary | ICD-10-CM | POA: Diagnosis not present

## 2017-07-29 NOTE — Progress Notes (Signed)
Name: Jamie Kim   MRN: [id number]    DOB: 04-22-1961   Date:07/29/2017       Progress Note  Subjective  Chief Complaint  Chief Complaint  Patient presents with  . Back Pain    established at [facility] for fingers and hands, not seen for back or neck    Patient presents to review orthpedic history for preauthorization for pain clinic management.   Back Pain  This is a chronic problem. The current episode started more than 1 year ago. The problem has been waxing and waning since onset. The pain is present in the lumbar spine, sacro-iliac and thoracic spine. The quality of the pain is described as aching. The pain is moderate. The symptoms are aggravated by twisting and bending. Associated symptoms include numbness, paresis, paresthesias, pelvic pain and weakness. Pertinent negatives include no abdominal pain, bladder incontinence, bowel incontinence, chest pain, dysuria, fever, headaches, tingling or weight loss. Treatments tried: pain clinic. The treatment provided mild relief.    No problem-specific Assessment & Plan notes found for this encounter.   Past Medical History:  Diagnosis Date  . Allergy   . Anxiety   . Back pain, chronic   . DDD (degenerative disc disease), cervical    lumbar  . Depression   . Fibromyalgia   . Fibromyalgia   . GERD (gastroesophageal reflux disease)   . H/O scarlet fever   . Heart murmur    hx  . Hyperlipidemia   . Hypertension   . Migraines   . Multiple thyroid nodules   . Sleep apnea   . Vitamin D deficiency     Past Surgical History:  Procedure Laterality Date  . ANTERIOR CERVICAL DECOMP/DISCECTOMY FUSION    . BLADDER SUSPENSION    . CARDIAC CATHETERIZATION  11/23/12   [facility];EF 65%  . CARPAL TUNNEL RELEASE Bilateral   . CHOLECYSTECTOMY    . HERNIA REPAIR    . KNEE SURGERY Left   . LAPAROSCOPY    . NECK SURGERY    . NISSEN FUNDOPLICATION    . TONSILLECTOMY    . TOTAL ABDOMINAL HYSTERECTOMY      Family History   Problem Relation Age of Onset  . Heart disease Mother   . Heart attack Mother        x2  . Cancer Father   . Melanoma Father   . Colon cancer Father   . Colon cancer Paternal Grandfather     Social History   Socioeconomic History  . Marital status: Married    Spouse name: Not on file  . Number of children: Not on file  . Years of education: Not on file  . Highest education level: Not on file  Social Needs  . Financial resource strain: Not on file  . Food insecurity - worry: Not on file  . Food insecurity - inability: Not on file  . Transportation needs - medical: Not on file  . Transportation needs - non-medical: Not on file  Occupational History  . Not on file  Tobacco Use  . Smoking status: Former Smoker    Packs/day: 0.50    Years: 20.00    Pack years: 10.00    Types: Cigarettes  . Smokeless tobacco: Never Used  Substance and Sexual Activity  . Alcohol use: No    Alcohol/week: 0.0 oz    Comment: 1 month  . Drug use: No  . Sexual activity: Not on file  Other Topics Concern  . Not on file  Social History Narrative  . Not on file    Allergies  Allergen Reactions  . Codeine Itching  . Hydrocodone Itching  . Morphine Itching  . Morphine And Related   . Oxybutynin Itching  . Oxycodone Itching  . Tramadol-Acetaminophen Itching    Outpatient Medications Prior to Visit  Medication Sig Dispense Refill  . acetaminophen (TYLENOL) 325 MG tablet Take 650 mg by mouth every 6 (six) hours as needed.    Marland Kitchen buPROPion (WELLBUTRIN XL) 150 MG 24 hr tablet Take 1 tablet (150 mg total) by mouth every morning. 30 tablet 5  . busPIRone (BUSPAR) 30 MG tablet Take 1 tablet (30 mg total) by mouth 2 (two) times daily. 60 tablet 5  . DULoxetine (CYMBALTA) 60 MG capsule Take 1-2 mg by mouth daily. Dr Primus Bravo    . esomeprazole (NEXIUM) 40 MG capsule Take 1 capsule (40 mg total) by mouth 2 (two) times daily. 60 capsule 5  . ezetimibe (ZETIA) 10 MG tablet Take 1 tablet (10 mg total) by  mouth daily. Reported on 10/15/2015 30 tablet 5  . gabapentin (NEURONTIN) 600 MG tablet Take 600 mg by mouth 2 (two) times daily. Dr Primus Bravo    . methadone (DOLOPHINE) 5 MG tablet Limit 2-3 tabs by mouth per day if tolerated (Patient taking differently: Take 5 mg by mouth. Limit 2-3 tabs by mouth per day if tolerated- Dr Primus Bravo) 90 tablet 0  . metoprolol succinate (TOPROL-XL) 100 MG 24 hr tablet Take 1 tablet (100 mg total) by mouth daily. 90 tablet 1  . Multiple Vitamin (MULTI-VITAMINS) TABS Take by mouth.    . simvastatin (ZOCOR) 80 MG tablet Take 1 tablet (80 mg total) by mouth at bedtime. 30 tablet 5  . tiZANidine (ZANAFLEX) 2 MG tablet Limit  2-4 tablets by mouth per day tolerated (Patient taking differently: Take 2 mg by mouth once. Limit  2-4 tablets by mouth per day tolerated/ Dr Primus Bravo) 110 tablet 2  . tolterodine (DETROL LA) 4 MG 24 hr capsule TAKE 1 CAPSULE (4 MG TOTAL) BY MOUTH DAILY. (Patient taking differently: Take 4 mg by mouth daily. Melody Burr) 90 capsule 3  . topiramate (TOPAMAX) 50 MG tablet Take 1 tablet (50 mg total) by mouth 2 (two) times daily. 60 tablet 5  . Vitamin D, Ergocalciferol, (DRISDOL) 50000 units CAPS capsule TAKE ONE CAPSULE BY MOUTH ONCE A WEEK 12 capsule 0  . esomeprazole (NEXIUM) 40 MG capsule TAKE ONE CAPSULE BY MOUTH EVERY DAY AS DIRECTED BY PROVIDER (NEEDS APPOINTMENT) 30 capsule 0  . topiramate (TOPAMAX) 50 MG tablet TAKE 1 TABLET (50 MG TOTAL) BY MOUTH 2 (TWO) TIMES DAILY. 60 tablet 0   Facility-Administered Medications Prior to Visit  Medication Dose Route Frequency Provider Last Rate Last Dose  . bupivacaine (MARCAINE) 0.5 % injection 30 mL  30 mL Other Once Mohammed Kindle, MD      . bupivacaine (PF) (MARCAINE) 0.25 % injection 30 mL  30 mL Other Once Mohammed Kindle, MD      . bupivacaine (PF) (MARCAINE) 0.25 % injection 30 mL  30 mL Other Once Mohammed Kindle, MD      . fentaNYL (SUBLIMAZE) injection 100 mcg  100 mcg Intravenous Once Mohammed Kindle, MD       . fentaNYL (SUBLIMAZE) injection 100 mcg  100 mcg Intravenous Once Mohammed Kindle, MD      . fentaNYL (SUBLIMAZE) injection 100 mcg  100 mcg Intravenous Once Mohammed Kindle, MD      . lactated ringers  infusion 1,000 mL  1,000 mL Intravenous Continuous Mohammed Kindle, MD      . lactated ringers infusion 1,000 mL  1,000 mL Intravenous Continuous Mohammed Kindle, MD      . lactated ringers infusion 1,000 mL  1,000 mL Intravenous Continuous Mohammed Kindle, MD      . lactated ringers infusion 1,000 mL  1,000 mL Intravenous Continuous Mohammed Kindle, MD      . lidocaine (PF) (XYLOCAINE) 1 % injection 10 mL  10 mL Subcutaneous Once Mohammed Kindle, MD      . midazolam (VERSED) 5 MG/5ML injection 5 mg  5 mg Intravenous Once Mohammed Kindle, MD      . midazolam (VERSED) 5 MG/5ML injection 5 mg  5 mg Intravenous Once Mohammed Kindle, MD      . midazolam (VERSED) 5 MG/5ML injection 5 mg  5 mg Intravenous Once Mohammed Kindle, MD      . orphenadrine (NORFLEX) injection 60 mg  60 mg Intramuscular Once Mohammed Kindle, MD      . orphenadrine (NORFLEX) injection 60 mg  60 mg Intramuscular Once Mohammed Kindle, MD      . orphenadrine (NORFLEX) injection 60 mg  60 mg Intramuscular Once Mohammed Kindle, MD      . sodium chloride 0.9 % injection 20 mL  20 mL Other Once Mohammed Kindle, MD      . triamcinolone acetonide (KENALOG-40) injection 40 mg  40 mg Other Once Mohammed Kindle, MD      . triamcinolone acetonide (KENALOG-40) injection 40 mg  40 mg Other Once Mohammed Kindle, MD      . triamcinolone acetonide (KENALOG-40) injection 40 mg  40 mg Other Once Mohammed Kindle, MD        Review of Systems  Constitutional: Negative for chills, fever, malaise/fatigue and weight loss.  HENT: Negative for ear discharge, ear pain and sore throat.   Eyes: Negative for blurred vision.  Respiratory: Negative for cough, sputum production, shortness of breath and wheezing.   Cardiovascular: Negative for chest pain, palpitations and  leg swelling.  Gastrointestinal: Negative for abdominal pain, blood in stool, bowel incontinence, constipation, diarrhea, heartburn, melena and nausea.  Genitourinary: Positive for pelvic pain. Negative for bladder incontinence, dysuria, frequency, hematuria and urgency.  Musculoskeletal: Positive for back pain. Negative for joint pain, myalgias and neck pain.  Skin: Negative for rash.  Neurological: Positive for weakness, numbness and paresthesias. Negative for dizziness, tingling, sensory change, focal weakness and headaches.  Endo/Heme/Allergies: Negative for environmental allergies and polydipsia. Does not bruise/bleed easily.  Psychiatric/Behavioral: Negative for depression and suicidal ideas. The patient is not nervous/anxious and does not have insomnia.      Objective  Vitals:   07/29/17 0956  BP: (!) 140/100  Pulse: 68  Weight: 213 lb (96.6 kg)  Height: 5\' 10"  (1.778 m)    Physical Exam  Constitutional: She is well-developed, well-nourished, and in no distress. No distress.  HENT:  Head: Normocephalic and atraumatic.  Right Ear: External ear normal.  Left Ear: External ear normal.  Nose: Nose normal.  Mouth/Throat: Oropharynx is clear and moist.  Eyes: Conjunctivae and EOM are normal. Pupils are equal, round, and reactive to light. Right eye exhibits no discharge. Left eye exhibits no discharge.  Neck: Normal range of motion. Neck supple. No JVD present. No thyromegaly present.  Cardiovascular: Normal rate, regular rhythm, normal heart sounds and intact distal pulses. Exam reveals no gallop and no friction rub.  No murmur heard. Pulmonary/Chest: Effort normal and breath sounds  normal. She has no wheezes. She has no rales.  Abdominal: Soft. Bowel sounds are normal. She exhibits no mass. There is no tenderness. There is no guarding.  Musculoskeletal: Normal range of motion. She exhibits no edema.       Right hip: She exhibits bony tenderness.       Left hip: She exhibits  tenderness and bony tenderness.       Cervical back: She exhibits spasm.       Thoracic back: She exhibits spasm.       Lumbar back: She exhibits spasm.  si tenderness  Lymphadenopathy:    She has no cervical adenopathy.  Neurological: She is alert. She has normal sensation, normal strength and normal reflexes.  Skin: Skin is warm and dry. She is not diaphoretic.  Psychiatric: Mood and affect normal.  Nursing note and vitals reviewed.     Assessment & Plan  Problem List Items Addressed This Visit      Musculoskeletal and Integument   DDD (degenerative disc disease), cervical   Relevant Orders   Ambulatory referral to Pain Clinic   Sacroiliac joint dysfunction of both sides   Relevant Orders   Ambulatory referral to Pain Clinic   Greater trochanteric bursitis of both hips   Relevant Orders   Ambulatory referral to Pain Clinic   Cervical facet syndrome   Relevant Orders   Ambulatory referral to Pain Clinic   DDD (degenerative disc disease), lumbar   Relevant Orders   Ambulatory referral to Pain Clinic     Other   Lumbosacral facet joint syndrome - Primary   Relevant Orders   Ambulatory referral to Pain Clinic      No orders of the defined types were placed in this encounter.     Dr. Macon [facility]  07/29/17

## 2017-08-10 DIAGNOSIS — M1990 Unspecified osteoarthritis, unspecified site: Secondary | ICD-10-CM | POA: Diagnosis not present

## 2017-08-10 DIAGNOSIS — Z5181 Encounter for therapeutic drug level monitoring: Secondary | ICD-10-CM | POA: Diagnosis not present

## 2017-08-10 DIAGNOSIS — M4310 Spondylolisthesis, site unspecified: Secondary | ICD-10-CM | POA: Diagnosis not present

## 2017-08-10 DIAGNOSIS — M4322 Fusion of spine, cervical region: Secondary | ICD-10-CM | POA: Diagnosis not present

## 2017-08-12 DIAGNOSIS — H5213 Myopia, bilateral: Secondary | ICD-10-CM | POA: Diagnosis not present

## 2017-08-12 DIAGNOSIS — H524 Presbyopia: Secondary | ICD-10-CM | POA: Diagnosis not present

## 2017-08-12 DIAGNOSIS — H16223 Keratoconjunctivitis sicca, not specified as Sjogren's, bilateral: Secondary | ICD-10-CM | POA: Diagnosis not present

## 2017-08-12 DIAGNOSIS — H04123 Dry eye syndrome of bilateral lacrimal glands: Secondary | ICD-10-CM | POA: Diagnosis not present

## 2017-08-24 ENCOUNTER — Other Ambulatory Visit: Payer: Self-pay | Admitting: Family Medicine

## 2017-08-24 DIAGNOSIS — K219 Gastro-esophageal reflux disease without esophagitis: Secondary | ICD-10-CM

## 2017-09-27 ENCOUNTER — Ambulatory Visit: Payer: Medicare Other | Admitting: Family Medicine

## 2017-10-04 DIAGNOSIS — M4322 Fusion of spine, cervical region: Secondary | ICD-10-CM | POA: Diagnosis not present

## 2017-10-04 DIAGNOSIS — M4310 Spondylolisthesis, site unspecified: Secondary | ICD-10-CM | POA: Diagnosis not present

## 2017-10-04 DIAGNOSIS — Z5181 Encounter for therapeutic drug level monitoring: Secondary | ICD-10-CM | POA: Diagnosis not present

## 2017-10-04 DIAGNOSIS — M1990 Unspecified osteoarthritis, unspecified site: Secondary | ICD-10-CM | POA: Diagnosis not present

## 2017-10-06 ENCOUNTER — Telehealth: Payer: Self-pay | Admitting: Family Medicine

## 2017-10-06 NOTE — Telephone Encounter (Signed)
Called to schedule AWV with Nurse Health Advisor. schedule AWV with [facility] any date   Thank you! For questions please contact: Jill Alexanders [phone]  Skype [email or web address]

## 2017-10-07 ENCOUNTER — Other Ambulatory Visit: Payer: Self-pay | Admitting: Family Medicine

## 2017-10-07 DIAGNOSIS — I10 Essential (primary) hypertension: Secondary | ICD-10-CM

## 2017-10-17 ENCOUNTER — Encounter: Payer: Self-pay | Admitting: Family Medicine

## 2017-10-17 ENCOUNTER — Ambulatory Visit (INDEPENDENT_AMBULATORY_CARE_PROVIDER_SITE_OTHER): Payer: Medicare HMO | Admitting: Family Medicine

## 2017-10-17 VITALS — BP 130/80 | HR 80 | Ht 70.0 in | Wt 210.0 lb

## 2017-10-17 DIAGNOSIS — F419 Anxiety disorder, unspecified: Secondary | ICD-10-CM | POA: Diagnosis not present

## 2017-10-17 DIAGNOSIS — K219 Gastro-esophageal reflux disease without esophagitis: Secondary | ICD-10-CM

## 2017-10-17 DIAGNOSIS — F339 Major depressive disorder, recurrent, unspecified: Secondary | ICD-10-CM | POA: Diagnosis not present

## 2017-10-17 DIAGNOSIS — G43C0 Periodic headache syndromes in child or adult, not intractable: Secondary | ICD-10-CM

## 2017-10-17 DIAGNOSIS — E782 Mixed hyperlipidemia: Secondary | ICD-10-CM | POA: Diagnosis not present

## 2017-10-17 DIAGNOSIS — H2000 Unspecified acute and subacute iridocyclitis: Secondary | ICD-10-CM | POA: Diagnosis not present

## 2017-10-17 DIAGNOSIS — I10 Essential (primary) hypertension: Secondary | ICD-10-CM

## 2017-10-17 MED ORDER — SIMVASTATIN 80 MG PO TABS: 80.0000 mg | ORAL_TABLET | Freq: Every day | ORAL | 1 refills | Status: DC

## 2017-10-17 MED ORDER — TOPIRAMATE 50 MG PO TABS: 50.0000 mg | ORAL_TABLET | Freq: Two times a day (BID) | ORAL | 5 refills | Status: DC

## 2017-10-17 MED ORDER — ESOMEPRAZOLE MAGNESIUM 40 MG PO CPDR: DELAYED_RELEASE_CAPSULE | ORAL | 2 refills | Status: DC

## 2017-10-17 MED ORDER — EZETIMIBE 10 MG PO TABS: 10.0000 mg | ORAL_TABLET | Freq: Every day | ORAL | 1 refills | Status: DC

## 2017-10-17 MED ORDER — METOPROLOL SUCCINATE ER 100 MG PO TB24: 100.0000 mg | ORAL_TABLET | Freq: Every day | ORAL | 1 refills | Status: DC

## 2017-10-17 MED ORDER — BUPROPION HCL ER (XL) 150 MG PO TB24: 150.0000 mg | ORAL_TABLET | Freq: Every morning | ORAL | 1 refills | Status: DC

## 2017-10-17 MED ORDER — BUSPIRONE HCL 30 MG PO TABS
30.0000 mg | ORAL_TABLET | Freq: Two times a day (BID) | ORAL | 5 refills | Status: DC
Start: 2017-10-17 — End: 2017-11-09

## 2017-10-17 NOTE — Progress Notes (Signed)
Name: Jamie Kim   MRN: [id number]    DOB: 1961/03/20   Date:10/17/2017       Progress Note  Subjective  Chief Complaint  Chief Complaint  Patient presents with  . Depression  . Gastroesophageal Reflux  . Hyperlipidemia  . Hypertension  . red eyes    Hx of iritis  . Migraine    Depression       The patient presents with depression.  This is a chronic problem.  The current episode started more than 1 year ago.   The onset quality is sudden.   The problem occurs every several days.  The problem has been gradually improving since onset.  Associated symptoms include no decreased concentration, no fatigue, no helplessness, no hopelessness, does not have insomnia, not irritable, no restlessness, no decreased interest, no appetite change, no body aches, no myalgias, no headaches, no indigestion, not sad and no suicidal ideas.     The symptoms are aggravated by nothing.  Past treatments include SSRIs - Selective serotonin reuptake inhibitors.  Compliance with treatment is good.  Previous treatment provided mild relief.  Past medical history includes depression.     Pertinent negatives include no hypothyroidism and no anxiety. Gastroesophageal Reflux  She reports no abdominal pain, no belching, no chest pain, no choking, no coughing, no globus sensation, no heartburn, no hoarse voice, no nausea, no sore throat, no stridor, no tooth decay, no water brash or no wheezing. This is a chronic problem. The current episode started 1 to 4 weeks ago. The problem has been rapidly improving. The symptoms are aggravated by certain foods. Pertinent negatives include no anemia, fatigue, melena, muscle weakness, orthopnea or weight loss. She has tried a PPI for the symptoms. The treatment provided significant relief.  Hyperlipidemia  This is a chronic problem. The current episode started more than 1 year ago. The problem is controlled. Recent lipid tests were reviewed and are normal. She has no history of  chronic renal disease, diabetes, hypothyroidism, liver disease, obesity or nephrotic syndrome. There are no known factors aggravating her hyperlipidemia. Pertinent negatives include no chest pain, focal weakness, myalgias or shortness of breath. Current antihyperlipidemic treatment includes statins. The current treatment provides moderate improvement of lipids. There are no compliance problems.  Risk factors for coronary artery disease include dyslipidemia, hypertension and post-menopausal.  Hypertension  This is a chronic problem. The current episode started more than 1 year ago. The problem has been waxing and waning since onset. The problem is controlled. Associated symptoms include blurred vision. Pertinent negatives include no anxiety, chest pain, headaches, malaise/fatigue, neck pain, orthopnea, palpitations, peripheral edema, PND, shortness of breath or sweats. There are no associated agents to hypertension. There are no known risk factors for coronary artery disease. Past treatments include beta blockers. The current treatment provides moderate improvement. There are no compliance problems.  There is no history of angina, kidney disease, CAD/MI, CVA, heart failure, left ventricular hypertrophy, PVD or retinopathy. There is no history of chronic renal disease.  Migraine   This is a chronic problem. The current episode started more than 1 year ago. The problem occurs constantly. The problem has been unchanged. Associated symptoms include blurred vision and eye pain. Pertinent negatives include no abdominal pain, back pain, coughing, dizziness, ear pain, fever, insomnia, nausea, neck pain, sore throat, tingling or weight loss. Her past medical history is significant for hypertension. There is no history of obesity.  Eye Pain   The right eye is affected. This is  a new (iritis pain) problem. The current episode started in the past 7 days (saturday). The problem occurs constantly. The problem has been  gradually worsening. There was no injury mechanism. The pain is at a severity of 8/10. The pain is severe. Associated symptoms include blurred vision. Pertinent negatives include no fever, nausea or tingling.    No problem-specific Assessment & Plan notes found for this encounter.   Past Medical History:  Diagnosis Date  . Allergy   . Anxiety   . Back pain, chronic   . DDD (degenerative disc disease), cervical    lumbar  . Depression   . Fibromyalgia   . Fibromyalgia   . GERD (gastroesophageal reflux disease)   . H/O scarlet fever   . Heart murmur    hx  . Hyperlipidemia   . Hypertension   . Migraines   . Multiple thyroid nodules   . Sleep apnea   . Vitamin D deficiency     Past Surgical History:  Procedure Laterality Date  . ANTERIOR CERVICAL DECOMP/DISCECTOMY FUSION    . BLADDER SUSPENSION    . CARDIAC CATHETERIZATION  11/23/12   [facility];EF 65%  . CARPAL TUNNEL RELEASE Bilateral   . CHOLECYSTECTOMY    . HERNIA REPAIR    . KNEE SURGERY Left   . LAPAROSCOPY    . NECK SURGERY    . NISSEN FUNDOPLICATION    . TONSILLECTOMY    . TOTAL ABDOMINAL HYSTERECTOMY      Family History  Problem Relation Age of Onset  . Heart disease Mother   . Heart attack Mother        x2  . Cancer Father   . Melanoma Father   . Colon cancer Father   . Colon cancer Paternal Grandfather     Social History   Socioeconomic History  . Marital status: Married    Spouse name: Not on file  . Number of children: Not on file  . Years of education: Not on file  . Highest education level: Not on file  Occupational History  . Not on file  Social Needs  . Financial resource strain: Not on file  . Food insecurity:    Worry: Not on file    Inability: Not on file  . Transportation needs:    Medical: Not on file    Non-medical: Not on file  Tobacco Use  . Smoking status: Former Smoker    Packs/day: 0.50    Years: 20.00    Pack years: 10.00    Types: Cigarettes  . Smokeless tobacco:  Never Used  Substance and Sexual Activity  . Alcohol use: No    Alcohol/week: 0.0 oz    Comment: 1 month  . Drug use: No  . Sexual activity: Not on file  Lifestyle  . Physical activity:    Days per week: Not on file    Minutes per session: Not on file  . Stress: Not on file  Relationships  . Social connections:    Talks on phone: Not on file    Gets together: Not on file    Attends religious service: Not on file    Active member of club or organization: Not on file    Attends meetings of clubs or organizations: Not on file    Relationship status: Not on file  . Intimate partner violence:    Fear of current or ex partner: Not on file    Emotionally abused: Not on file    Physically abused:  Not on file    Forced sexual activity: Not on file  Other Topics Concern  . Not on file  Social History Narrative  . Not on file    Allergies  Allergen Reactions  . Codeine Itching  . Hydrocodone Itching  . Morphine Itching  . Morphine And Related   . Oxybutynin Itching  . Oxycodone Itching  . Tramadol-Acetaminophen Itching    Outpatient Medications Prior to Visit  Medication Sig Dispense Refill  . acetaminophen (TYLENOL) 325 MG tablet Take 650 mg by mouth every 6 (six) hours as needed.    . DULoxetine (CYMBALTA) 60 MG capsule Take 1-2 mg by mouth daily. Dr Primus Bravo    . gabapentin (NEURONTIN) 600 MG tablet Take 600 mg by mouth 2 (two) times daily. Dr Primus Bravo    . methadone (DOLOPHINE) 5 MG tablet Limit 2-3 tabs by mouth per day if tolerated (Patient taking differently: Take 5 mg by mouth. Limit 2-3 tabs by mouth per day if tolerated- Dr Primus Bravo) 90 tablet 0  . Multiple Vitamin (MULTI-VITAMINS) TABS Take by mouth.    Marland Kitchen tiZANidine (ZANAFLEX) 2 MG tablet Limit  2-4 tablets by mouth per day tolerated (Patient taking differently: Take 2 mg by mouth once. Limit  2-4 tablets by mouth per day tolerated/ Dr Primus Bravo) 110 tablet 2  . tolterodine (DETROL LA) 4 MG 24 hr capsule TAKE 1 CAPSULE (4 MG  TOTAL) BY MOUTH DAILY. (Patient taking differently: Take 4 mg by mouth daily. Melody Burr) 90 capsule 3  . Vitamin D, Ergocalciferol, (DRISDOL) 50000 units CAPS capsule TAKE ONE CAPSULE BY MOUTH ONCE A WEEK 12 capsule 0  . buPROPion (WELLBUTRIN XL) 150 MG 24 hr tablet Take 1 tablet (150 mg total) by mouth every morning. 30 tablet 5  . busPIRone (BUSPAR) 30 MG tablet Take 1 tablet (30 mg total) by mouth 2 (two) times daily. 60 tablet 5  . esomeprazole (NEXIUM) 40 MG capsule TAKE ONE CAPSULE BY MOUTH EVERY DAY AS DIRECTED BY PROVIDER (NEEDS APPOINTMENT) 30 capsule 2  . ezetimibe (ZETIA) 10 MG tablet Take 1 tablet (10 mg total) by mouth daily. Reported on 10/15/2015 30 tablet 5  . metoprolol succinate (TOPROL-XL) 100 MG 24 hr tablet TAKE 1 TABLET BY MOUTH EVERY DAY 90 tablet 0  . simvastatin (ZOCOR) 80 MG tablet Take 1 tablet (80 mg total) by mouth at bedtime. 30 tablet 5  . topiramate (TOPAMAX) 50 MG tablet Take 1 tablet (50 mg total) by mouth 2 (two) times daily. 60 tablet 5  . esomeprazole (NEXIUM) 40 MG capsule Take 1 capsule (40 mg total) by mouth 2 (two) times daily. 60 capsule 5   Facility-Administered Medications Prior to Visit  Medication Dose Route Frequency Provider Last Rate Last Dose  . bupivacaine (MARCAINE) 0.5 % injection 30 mL  30 mL Other Once Mohammed Kindle, MD      . bupivacaine (PF) (MARCAINE) 0.25 % injection 30 mL  30 mL Other Once Mohammed Kindle, MD      . bupivacaine (PF) (MARCAINE) 0.25 % injection 30 mL  30 mL Other Once Mohammed Kindle, MD      . fentaNYL (SUBLIMAZE) injection 100 mcg  100 mcg Intravenous Once Mohammed Kindle, MD      . fentaNYL (SUBLIMAZE) injection 100 mcg  100 mcg Intravenous Once Mohammed Kindle, MD      . fentaNYL (SUBLIMAZE) injection 100 mcg  100 mcg Intravenous Once Mohammed Kindle, MD      . lactated ringers infusion 1,000 mL  1,000 mL Intravenous Continuous Mohammed Kindle, MD      . lactated ringers infusion 1,000 mL  1,000 mL Intravenous Continuous  Mohammed Kindle, MD      . lactated ringers infusion 1,000 mL  1,000 mL Intravenous Continuous Mohammed Kindle, MD      . lactated ringers infusion 1,000 mL  1,000 mL Intravenous Continuous Mohammed Kindle, MD      . lidocaine (PF) (XYLOCAINE) 1 % injection 10 mL  10 mL Subcutaneous Once Mohammed Kindle, MD      . midazolam (VERSED) 5 MG/5ML injection 5 mg  5 mg Intravenous Once Mohammed Kindle, MD      . midazolam (VERSED) 5 MG/5ML injection 5 mg  5 mg Intravenous Once Mohammed Kindle, MD      . midazolam (VERSED) 5 MG/5ML injection 5 mg  5 mg Intravenous Once Mohammed Kindle, MD      . orphenadrine (NORFLEX) injection 60 mg  60 mg Intramuscular Once Mohammed Kindle, MD      . orphenadrine (NORFLEX) injection 60 mg  60 mg Intramuscular Once Mohammed Kindle, MD      . orphenadrine (NORFLEX) injection 60 mg  60 mg Intramuscular Once Mohammed Kindle, MD      . sodium chloride 0.9 % injection 20 mL  20 mL Other Once Mohammed Kindle, MD      . triamcinolone acetonide (KENALOG-40) injection 40 mg  40 mg Other Once Mohammed Kindle, MD      . triamcinolone acetonide (KENALOG-40) injection 40 mg  40 mg Other Once Mohammed Kindle, MD      . triamcinolone acetonide (KENALOG-40) injection 40 mg  40 mg Other Once Mohammed Kindle, MD        Review of Systems  Constitutional: Negative for appetite change, chills, fatigue, fever, malaise/fatigue and weight loss.  HENT: Negative for ear discharge, ear pain, hoarse voice and sore throat.   Eyes: Positive for blurred vision and pain.  Respiratory: Negative for cough, sputum production, choking, shortness of breath and wheezing.   Cardiovascular: Negative for chest pain, palpitations, orthopnea, leg swelling and PND.  Gastrointestinal: Negative for abdominal pain, blood in stool, constipation, diarrhea, heartburn, melena and nausea.  Genitourinary: Negative for dysuria, frequency, hematuria and urgency.  Musculoskeletal: Negative for back pain, joint pain, myalgias, muscle  weakness and neck pain.  Skin: Negative for rash.  Neurological: Negative for dizziness, tingling, sensory change, focal weakness and headaches.  Endo/Heme/Allergies: Negative for environmental allergies and polydipsia. Does not bruise/bleed easily.  Psychiatric/Behavioral: Positive for depression. Negative for decreased concentration and suicidal ideas. The patient is not nervous/anxious and does not have insomnia.      Objective  Vitals:   10/17/17 1521  BP: 130/80  Pulse: 80  Weight: 210 lb (95.3 kg)  Height: 5\' 10"  (1.778 m)    Physical Exam  Constitutional: She is oriented to person, place, and time. She appears well-developed and well-nourished. She is not irritable.  HENT:  Head: Normocephalic.  Right Ear: External ear normal.  Left Ear: External ear normal.  Mouth/Throat: Oropharynx is clear and moist.  Eyes: Pupils are equal, round, and reactive to light. Conjunctivae and EOM are normal. Lids are everted and swept, no foreign bodies found. Right eye exhibits no discharge. No foreign body present in the right eye. Left eye hordeolum: scleral erythema. Right conjunctiva is not injected. Left conjunctiva is not injected. No scleral icterus.  Neck: Normal range of motion. Neck supple. No JVD present. No tracheal deviation present. No thyromegaly present.  Cardiovascular: Normal rate, regular rhythm, normal heart sounds and intact distal pulses. Exam reveals no gallop and no friction rub.  No murmur heard. Pulmonary/Chest: Effort normal and breath sounds normal. No respiratory distress. She has no wheezes. She has no rales.  Abdominal: Soft. Bowel sounds are normal. She exhibits no mass. There is no hepatosplenomegaly. There is no tenderness. There is no rebound and no guarding.  Musculoskeletal: Normal range of motion. She exhibits no edema or tenderness.  Lymphadenopathy:    She has no cervical adenopathy.  Neurological: She is alert and oriented to person, place, and time.  She has normal strength. She displays normal reflexes. No cranial nerve deficit.  Skin: Skin is warm. No rash noted.  Psychiatric: She has a normal mood and affect. Her mood appears not anxious. She does not exhibit a depressed mood.  Nursing note and vitals reviewed.     Assessment & Plan  Problem List Items Addressed This Visit      Cardiovascular and Mediastinum   Essential hypertension - Primary   Relevant Medications   ezetimibe (ZETIA) 10 MG tablet   metoprolol succinate (TOPROL-XL) 100 MG 24 hr tablet   simvastatin (ZOCOR) 80 MG tablet   Periodic headache syndrome, not intractable   Relevant Medications   buPROPion (WELLBUTRIN XL) 150 MG 24 hr tablet   ezetimibe (ZETIA) 10 MG tablet   metoprolol succinate (TOPROL-XL) 100 MG 24 hr tablet   simvastatin (ZOCOR) 80 MG tablet   topiramate (TOPAMAX) 50 MG tablet     Digestive   Gastroesophageal reflux disease   Relevant Medications   esomeprazole (NEXIUM) 40 MG capsule     Other   Hyperlipidemia   Relevant Medications   ezetimibe (ZETIA) 10 MG tablet   metoprolol succinate (TOPROL-XL) 100 MG 24 hr tablet   simvastatin (ZOCOR) 80 MG tablet   Chronic anxiety   Relevant Medications   buPROPion (WELLBUTRIN XL) 150 MG 24 hr tablet   busPIRone (BUSPAR) 30 MG tablet   Depression, recurrent (HCC)   Relevant Medications   buPROPion (WELLBUTRIN XL) 150 MG 24 hr tablet   busPIRone (BUSPAR) 30 MG tablet    Other Visit Diagnoses    Acute iritis, right eye          Meds ordered this encounter  Medications  . buPROPion (WELLBUTRIN XL) 150 MG 24 hr tablet    Sig: Take 1 tablet (150 mg total) by mouth every morning.    Dispense:  90 tablet    Refill:  1  . busPIRone (BUSPAR) 30 MG tablet    Sig: Take 1 tablet (30 mg total) by mouth 2 (two) times daily.    Dispense:  60 tablet    Refill:  5  . esomeprazole (NEXIUM) 40 MG capsule    Sig: TAKE ONE CAPSULE BY MOUTH EVERY DAY AS DIRECTED BY PROVIDER (NEEDS APPOINTMENT)     Dispense:  30 capsule    Refill:  2  . ezetimibe (ZETIA) 10 MG tablet    Sig: Take 1 tablet (10 mg total) by mouth daily. Reported on 10/15/2015    Dispense:  90 tablet    Refill:  1  . metoprolol succinate (TOPROL-XL) 100 MG 24 hr tablet    Sig: Take 1 tablet (100 mg total) by mouth daily. Take with or immediately following a meal.    Dispense:  90 tablet    Refill:  1  . simvastatin (ZOCOR) 80 MG tablet    Sig: Take 1 tablet (80 mg  total) by mouth at bedtime.    Dispense:  90 tablet    Refill:  1  . topiramate (TOPAMAX) 50 MG tablet    Sig: Take 1 tablet (50 mg total) by mouth 2 (two) times daily.    Dispense:  60 tablet    Refill:  5  call [facility] at [facility] in the morning for appt work in for iritis    Dr. Macon [facility]  10/17/17

## 2017-10-18 DIAGNOSIS — H2011 Chronic iridocyclitis, right eye: Secondary | ICD-10-CM | POA: Diagnosis not present

## 2017-10-20 DIAGNOSIS — H2011 Chronic iridocyclitis, right eye: Secondary | ICD-10-CM | POA: Diagnosis not present

## 2017-10-26 ENCOUNTER — Other Ambulatory Visit: Payer: Self-pay | Admitting: Family Medicine

## 2017-10-26 DIAGNOSIS — E559 Vitamin D deficiency, unspecified: Secondary | ICD-10-CM

## 2017-11-01 DIAGNOSIS — Z5181 Encounter for therapeutic drug level monitoring: Secondary | ICD-10-CM | POA: Diagnosis not present

## 2017-11-01 DIAGNOSIS — M4322 Fusion of spine, cervical region: Secondary | ICD-10-CM | POA: Diagnosis not present

## 2017-11-01 DIAGNOSIS — M1990 Unspecified osteoarthritis, unspecified site: Secondary | ICD-10-CM | POA: Diagnosis not present

## 2017-11-01 DIAGNOSIS — M4310 Spondylolisthesis, site unspecified: Secondary | ICD-10-CM | POA: Diagnosis not present

## 2017-11-09 ENCOUNTER — Other Ambulatory Visit: Payer: Self-pay | Admitting: Family Medicine

## 2017-11-09 DIAGNOSIS — F419 Anxiety disorder, unspecified: Secondary | ICD-10-CM

## 2017-11-23 ENCOUNTER — Encounter: Payer: Self-pay | Admitting: Family Medicine

## 2017-11-23 ENCOUNTER — Ambulatory Visit (INDEPENDENT_AMBULATORY_CARE_PROVIDER_SITE_OTHER): Payer: Medicare HMO | Admitting: Family Medicine

## 2017-11-23 ENCOUNTER — Other Ambulatory Visit: Payer: Self-pay | Admitting: Obstetrics and Gynecology

## 2017-11-23 VITALS — BP 120/70 | HR 80 | Ht 70.0 in | Wt 205.0 lb

## 2017-11-23 DIAGNOSIS — L089 Local infection of the skin and subcutaneous tissue, unspecified: Secondary | ICD-10-CM | POA: Diagnosis not present

## 2017-11-23 DIAGNOSIS — L0100 Impetigo, unspecified: Secondary | ICD-10-CM | POA: Diagnosis not present

## 2017-11-23 DIAGNOSIS — J0140 Acute pansinusitis, unspecified: Secondary | ICD-10-CM | POA: Diagnosis not present

## 2017-11-23 DIAGNOSIS — W19XXXA Unspecified fall, initial encounter: Secondary | ICD-10-CM

## 2017-11-23 DIAGNOSIS — S80811A Abrasion, right lower leg, initial encounter: Secondary | ICD-10-CM

## 2017-11-23 DIAGNOSIS — S8011XA Contusion of right lower leg, initial encounter: Secondary | ICD-10-CM | POA: Diagnosis not present

## 2017-11-23 MED ORDER — AMOXICILLIN-POT CLAVULANATE 875-125 MG PO TABS: 1.0000 | ORAL_TABLET | Freq: Two times a day (BID) | ORAL | 0 refills | Status: DC

## 2017-11-23 MED ORDER — MUPIROCIN 2 % EX OINT: 1.0000 "application " | TOPICAL_OINTMENT | Freq: Two times a day (BID) | CUTANEOUS | 1 refills | Status: DC

## 2017-11-23 NOTE — Progress Notes (Signed)
Name: Jamie Kim   MRN: [id number]    DOB: 11-03-1960   Date:11/24/2017       Progress Note  Subjective  Chief Complaint  Chief Complaint  Patient presents with  . Sinusitis    hoarse, congestion and cough from drainage  . Leg Pain    fell through deck board with R) leg. Leg is hurting and feels like knots under skin    Sinusitis  This is a new problem. The current episode started 1 to 4 weeks ago (2 weeks). The problem is unchanged. There has been no fever. The fever has been present for 3 to 4 days. Associated symptoms include congestion, coughing, ear pain, headaches, sinus pressure, a sore throat and swollen glands. Pertinent negatives include no chills, diaphoresis, hoarse voice, neck pain, shortness of breath or sneezing. (Bilateral) Past treatments include oral decongestants (mucinex dm). The treatment provided no relief.  Leg Pain   The incident occurred more than 1 week ago. The injury mechanism was a fall (fell through deck). The pain is present in the right leg. The quality of the pain is described as aching. The pain is moderate. The pain has been improving since onset. Pertinent negatives include no inability to bear weight, loss of motion, loss of sensation, muscle weakness, numbness or tingling. She has tried nothing for the symptoms.  Fall  The accident occurred more than 1 week ago. Impact surface: fell through deck. The volume of blood lost was moderate. The pain is mild. The symptoms are aggravated by ambulation. Associated symptoms include headaches. Pertinent negatives include no abdominal pain, fever, hematuria, nausea, numbness or tingling.    No problem-specific Assessment & Plan notes found for this encounter.   Past Medical History:  Diagnosis Date  . Allergy   . Anxiety   . Back pain, chronic   . DDD (degenerative disc disease), cervical    lumbar  . Depression   . Fibromyalgia   . Fibromyalgia   . GERD (gastroesophageal reflux disease)   . H/O  scarlet fever   . Heart murmur    hx  . Hyperlipidemia   . Hypertension   . Migraines   . Multiple thyroid nodules   . Sleep apnea   . Vitamin D deficiency     Past Surgical History:  Procedure Laterality Date  . ANTERIOR CERVICAL DECOMP/DISCECTOMY FUSION    . BLADDER SUSPENSION    . CARDIAC CATHETERIZATION  11/23/12   [facility];EF 65%  . CARPAL TUNNEL RELEASE Bilateral   . CHOLECYSTECTOMY    . HERNIA REPAIR    . KNEE SURGERY Left   . LAPAROSCOPY    . NECK SURGERY    . NISSEN FUNDOPLICATION    . TONSILLECTOMY    . TOTAL ABDOMINAL HYSTERECTOMY      Family History  Problem Relation Age of Onset  . Heart disease Mother   . Heart attack Mother        x2  . Cancer Father   . Melanoma Father   . Colon cancer Father   . Colon cancer Paternal Grandfather     Social History   Socioeconomic History  . Marital status: Married    Spouse name: Not on file  . Number of children: Not on file  . Years of education: Not on file  . Highest education level: Not on file  Occupational History  . Not on file  Social Needs  . Financial resource strain: Not on file  . Food insecurity:  Worry: Not on file    Inability: Not on file  . Transportation needs:    Medical: Not on file    Non-medical: Not on file  Tobacco Use  . Smoking status: Former Smoker    Packs/day: 0.50    Years: 20.00    Pack years: 10.00    Types: Cigarettes  . Smokeless tobacco: Never Used  Substance and Sexual Activity  . Alcohol use: No    Alcohol/week: 0.0 oz    Comment: 1 month  . Drug use: No  . Sexual activity: Not on file  Lifestyle  . Physical activity:    Days per week: Not on file    Minutes per session: Not on file  . Stress: Not on file  Relationships  . Social connections:    Talks on phone: Not on file    Gets together: Not on file    Attends religious service: Not on file    Active member of club or organization: Not on file    Attends meetings of clubs or organizations: Not on  file    Relationship status: Not on file  . Intimate partner violence:    Fear of current or ex partner: Not on file    Emotionally abused: Not on file    Physically abused: Not on file    Forced sexual activity: Not on file  Other Topics Concern  . Not on file  Social History Narrative  . Not on file    Allergies  Allergen Reactions  . Codeine Itching  . Hydrocodone Itching  . Morphine Itching  . Morphine And Related   . Oxybutynin Itching  . Oxycodone Itching  . Tramadol-Acetaminophen Itching    Outpatient Medications Prior to Visit  Medication Sig Dispense Refill  . acetaminophen (TYLENOL) 325 MG tablet Take 650 mg by mouth every 6 (six) hours as needed.    Marland Kitchen buPROPion (WELLBUTRIN XL) 150 MG 24 hr tablet Take 1 tablet (150 mg total) by mouth every morning. 90 tablet 1  . busPIRone (BUSPAR) 30 MG tablet TAKE 1 TABLET (30 MG TOTAL) BY MOUTH 2 (TWO) TIMES DAILY. 60 tablet 2  . DULoxetine (CYMBALTA) 60 MG capsule Take 1-2 mg by mouth daily. Dr Primus Bravo    . esomeprazole (NEXIUM) 40 MG capsule TAKE ONE CAPSULE BY MOUTH EVERY DAY AS DIRECTED BY PROVIDER (NEEDS APPOINTMENT) 30 capsule 2  . ezetimibe (ZETIA) 10 MG tablet Take 1 tablet (10 mg total) by mouth daily. Reported on 10/15/2015 90 tablet 1  . gabapentin (NEURONTIN) 600 MG tablet Take 600 mg by mouth 2 (two) times daily. Dr Primus Bravo    . methadone (DOLOPHINE) 5 MG tablet Limit 2-3 tabs by mouth per day if tolerated (Patient taking differently: Take 5 mg by mouth. Limit 2-3 tabs by mouth per day if tolerated- Dr Primus Bravo) 90 tablet 0  . metoprolol succinate (TOPROL-XL) 100 MG 24 hr tablet Take 1 tablet (100 mg total) by mouth daily. Take with or immediately following a meal. 90 tablet 1  . Multiple Vitamin (MULTI-VITAMINS) TABS Take by mouth.    . simvastatin (ZOCOR) 80 MG tablet Take 1 tablet (80 mg total) by mouth at bedtime. 90 tablet 1  . tiZANidine (ZANAFLEX) 2 MG tablet Limit  2-4 tablets by mouth per day tolerated (Patient taking  differently: Take 2 mg by mouth once. Limit  2-4 tablets by mouth per day tolerated/ Dr Primus Bravo) 110 tablet 2  . tolterodine (DETROL LA) 4 MG 24 hr capsule TAKE  1 CAPSULE (4 MG TOTAL) BY MOUTH DAILY. (Patient taking differently: Take 4 mg by mouth daily. Melody Burr) 90 capsule 3  . topiramate (TOPAMAX) 50 MG tablet Take 1 tablet (50 mg total) by mouth 2 (two) times daily. (Patient taking differently: Take 50 mg by mouth 2 (two) times daily. Dr Primus Bravo) 60 tablet 5  . Vitamin D, Ergocalciferol, (DRISDOL) 50000 units CAPS capsule TAKE 1 CAPSULE BY MOUTH ONCE A WEEK 12 capsule 0  . esomeprazole (NEXIUM) 40 MG capsule Take 1 capsule (40 mg total) by mouth 2 (two) times daily. 60 capsule 5   Facility-Administered Medications Prior to Visit  Medication Dose Route Frequency Provider Last Rate Last Dose  . bupivacaine (MARCAINE) 0.5 % injection 30 mL  30 mL Other Once Mohammed Kindle, MD      . bupivacaine (PF) (MARCAINE) 0.25 % injection 30 mL  30 mL Other Once Mohammed Kindle, MD      . bupivacaine (PF) (MARCAINE) 0.25 % injection 30 mL  30 mL Other Once Mohammed Kindle, MD      . fentaNYL (SUBLIMAZE) injection 100 mcg  100 mcg Intravenous Once Mohammed Kindle, MD      . fentaNYL (SUBLIMAZE) injection 100 mcg  100 mcg Intravenous Once Mohammed Kindle, MD      . fentaNYL (SUBLIMAZE) injection 100 mcg  100 mcg Intravenous Once Mohammed Kindle, MD      . lactated ringers infusion 1,000 mL  1,000 mL Intravenous Continuous Mohammed Kindle, MD      . lactated ringers infusion 1,000 mL  1,000 mL Intravenous Continuous Mohammed Kindle, MD      . lactated ringers infusion 1,000 mL  1,000 mL Intravenous Continuous Mohammed Kindle, MD      . lactated ringers infusion 1,000 mL  1,000 mL Intravenous Continuous Mohammed Kindle, MD      . lidocaine (PF) (XYLOCAINE) 1 % injection 10 mL  10 mL Subcutaneous Once Mohammed Kindle, MD      . midazolam (VERSED) 5 MG/5ML injection 5 mg  5 mg Intravenous Once Mohammed Kindle, MD      .  midazolam (VERSED) 5 MG/5ML injection 5 mg  5 mg Intravenous Once Mohammed Kindle, MD      . midazolam (VERSED) 5 MG/5ML injection 5 mg  5 mg Intravenous Once Mohammed Kindle, MD      . orphenadrine (NORFLEX) injection 60 mg  60 mg Intramuscular Once Mohammed Kindle, MD      . orphenadrine (NORFLEX) injection 60 mg  60 mg Intramuscular Once Mohammed Kindle, MD      . orphenadrine (NORFLEX) injection 60 mg  60 mg Intramuscular Once Mohammed Kindle, MD      . sodium chloride 0.9 % injection 20 mL  20 mL Other Once Mohammed Kindle, MD      . triamcinolone acetonide (KENALOG-40) injection 40 mg  40 mg Other Once Mohammed Kindle, MD      . triamcinolone acetonide (KENALOG-40) injection 40 mg  40 mg Other Once Mohammed Kindle, MD      . triamcinolone acetonide (KENALOG-40) injection 40 mg  40 mg Other Once Mohammed Kindle, MD        Review of Systems  Constitutional: Negative for chills, diaphoresis, fever, malaise/fatigue and weight loss.  HENT: Positive for congestion, ear pain, sinus pressure and sore throat. Negative for ear discharge, hoarse voice and sneezing.   Eyes: Negative for blurred vision.  Respiratory: Positive for cough. Negative for sputum production, shortness of breath and wheezing.  Cardiovascular: Negative for chest pain, palpitations and leg swelling.  Gastrointestinal: Negative for abdominal pain, blood in stool, constipation, diarrhea, heartburn, melena and nausea.  Genitourinary: Negative for dysuria, frequency, hematuria and urgency.  Musculoskeletal: Negative for back pain, joint pain, myalgias and neck pain.  Skin: Negative for rash.  Neurological: Positive for headaches. Negative for dizziness, tingling, sensory change, focal weakness and numbness.  Endo/Heme/Allergies: Negative for environmental allergies and polydipsia. Does not bruise/bleed easily.  Psychiatric/Behavioral: Negative for depression and suicidal ideas. The patient is not nervous/anxious and does not have  insomnia.      Objective  Vitals:   11/23/17 1006  BP: 120/70  Pulse: 80  Weight: 205 lb (93 kg)  Height: 5\' 10"  (1.778 m)    Physical Exam  Constitutional: She is oriented to person, place, and time. She appears well-developed and well-nourished.  HENT:  Head: Normocephalic.  Right Ear: Tympanic membrane, external ear and ear canal normal.  Left Ear: Tympanic membrane, external ear and ear canal normal.  Nose: Right sinus exhibits maxillary sinus tenderness and frontal sinus tenderness. Left sinus exhibits maxillary sinus tenderness and frontal sinus tenderness.  Mouth/Throat: Uvula is midline and oropharynx is clear and moist. No oropharyngeal exudate, posterior oropharyngeal edema or posterior oropharyngeal erythema.  Eyes: Pupils are equal, round, and reactive to light. Conjunctivae and EOM are normal. Lids are everted and swept, no foreign bodies found. Left eye exhibits no hordeolum. No foreign body present in the left eye. Right conjunctiva is not injected. Left conjunctiva is not injected. No scleral icterus.  Neck: Normal range of motion. Neck supple. No JVD present. No tracheal deviation present. No thyromegaly present.  Cardiovascular: Normal rate, regular rhythm, normal heart sounds and intact distal pulses. Exam reveals no gallop and no friction rub.  No murmur heard. Pulmonary/Chest: Effort normal and breath sounds normal. No respiratory distress. She has no wheezes. She has no rales.  Abdominal: Soft. Bowel sounds are normal. She exhibits no mass. There is no hepatosplenomegaly. There is no tenderness. There is no rebound and no guarding.  Musculoskeletal: Normal range of motion. She exhibits no edema or tenderness.  Lymphadenopathy:    She has no cervical adenopathy.  Neurological: She is alert and oriented to person, place, and time. She has normal strength. She displays normal reflexes. No cranial nerve deficit.  Skin: Skin is warm. Bruising noted. No rash noted.   Linear abrasion 3 inches with surrounding erythema/  Honeycrusted areas right facial  Psychiatric: She has a normal mood and affect. Her mood appears not anxious. She does not exhibit a depressed mood.  Nursing note and vitals reviewed.     Assessment & Plan  Problem List Items Addressed This Visit    None    Visit Diagnoses    Fall, initial encounter    -  Primary   fell through deck/crush injury/local care discussed/ trat abrasion   Acute pansinusitis, recurrence not specified       persistent 3 weeks/yellow disharge/    Relevant Medications   amoxicillin-clavulanate (AUGMENTIN) 875-125 MG tablet   mupirocin ointment (BACTROBAN) 2 %   Contusion of right lower leg, initial encounter       s/p fall thriughgh deck/no compartment syndrome   Abrasion, leg w/ infection, right, initial encounter       secondary staph infection likely with delayed healing necessitating augmentin.   Relevant Medications   amoxicillin-clavulanate (AUGMENTIN) 875-125 MG tablet   mupirocin ointment (BACTROBAN) 2 %   Impetigo  facial area lilely baseline acneform activity   Relevant Medications   amoxicillin-clavulanate (AUGMENTIN) 875-125 MG tablet   mupirocin ointment (BACTROBAN) 2 %      Meds ordered this encounter  Medications  . amoxicillin-clavulanate (AUGMENTIN) 875-125 MG tablet    Sig: Take 1 tablet by mouth 2 (two) times daily.    Dispense:  20 tablet    Refill:  0  . mupirocin ointment (BACTROBAN) 2 %    Sig: Apply 1 application topically 2 (two) times daily.    Dispense:  22 g    Refill:  1      Dr. Mac[facility]  11/24/17

## 2017-11-23 NOTE — Patient Instructions (Signed)
Contusion A contusion is a deep bruise. Contusions are the result of a blunt injury to tissues and muscle fibers under the skin. The injury causes bleeding under the skin. The skin overlying the contusion may turn blue, purple, or yellow. Minor injuries will give you a painless contusion, but more severe contusions may stay painful and swollen for a few weeks. What are the causes? This condition is usually caused by a blow, trauma, or direct force to an area of the body. What are the signs or symptoms? Symptoms of this condition include:  Swelling of the injured area.  Pain and tenderness in the injured area.  Discoloration. The area may have redness and then turn blue, purple, or yellow.  How is this diagnosed? This condition is diagnosed based on a physical exam and medical history. An X-ray, CT scan, or MRI may be needed to determine if there are any associated injuries, such as broken bones (fractures). How is this treated? Specific treatment for this condition depends on what area of the body was injured. In general, the best treatment for a contusion is resting, icing, applying pressure to (compression), and elevating the injured area. This is often called the RICE strategy. Over-the-counter anti-inflammatory medicines may also be recommended for pain control. Follow these instructions at home:  Rest the injured area.  If directed, apply ice to the injured area: ? Put ice in a plastic bag. ? Place a towel between your skin and the bag. ? Leave the ice on for 20 minutes, 2-3 times per day.  If directed, apply light compression to the injured area using an elastic bandage. Make sure the bandage is not wrapped too tightly. Remove and reapply the bandage as directed by your health care provider.  If possible, raise (elevate) the injured area above the level of your heart while you are sitting or lying down.  Take over-the-counter and prescription medicines only as told by your health  care provider. Contact a health care provider if:  Your symptoms do not improve after several days of treatment.  Your symptoms get worse.  You have difficulty moving the injured area. Get help right away if:  You have severe pain.  You have numbness in a hand or foot.  Your hand or foot turns pale or cold. This information is not intended to replace advice given to you by your health care provider. Make sure you discuss any questions you have with your health care provider. Document Released: 03/31/2005 Document Revised: 10/30/2015 Document Reviewed: 11/06/2014 [employer] Interactive Patient Education  2018 [employer]  

## 2017-12-06 DIAGNOSIS — M4310 Spondylolisthesis, site unspecified: Secondary | ICD-10-CM | POA: Diagnosis not present

## 2017-12-06 DIAGNOSIS — M1990 Unspecified osteoarthritis, unspecified site: Secondary | ICD-10-CM | POA: Diagnosis not present

## 2017-12-06 DIAGNOSIS — M4322 Fusion of spine, cervical region: Secondary | ICD-10-CM | POA: Diagnosis not present

## 2017-12-06 DIAGNOSIS — Z5181 Encounter for therapeutic drug level monitoring: Secondary | ICD-10-CM | POA: Diagnosis not present

## 2017-12-30 ENCOUNTER — Other Ambulatory Visit: Payer: Self-pay | Admitting: Obstetrics and Gynecology

## 2018-01-11 ENCOUNTER — Encounter: Payer: Medicare Other | Admitting: Obstetrics and Gynecology

## 2018-01-11 DIAGNOSIS — M4322 Fusion of spine, cervical region: Secondary | ICD-10-CM | POA: Diagnosis not present

## 2018-01-11 DIAGNOSIS — M1990 Unspecified osteoarthritis, unspecified site: Secondary | ICD-10-CM | POA: Diagnosis not present

## 2018-01-11 DIAGNOSIS — M4310 Spondylolisthesis, site unspecified: Secondary | ICD-10-CM | POA: Diagnosis not present

## 2018-01-11 DIAGNOSIS — Z5181 Encounter for therapeutic drug level monitoring: Secondary | ICD-10-CM | POA: Diagnosis not present

## 2018-01-17 ENCOUNTER — Other Ambulatory Visit: Payer: Self-pay | Admitting: Family Medicine

## 2018-01-17 DIAGNOSIS — E782 Mixed hyperlipidemia: Secondary | ICD-10-CM

## 2018-01-17 DIAGNOSIS — K219 Gastro-esophageal reflux disease without esophagitis: Secondary | ICD-10-CM

## 2018-01-20 ENCOUNTER — Other Ambulatory Visit: Payer: Self-pay

## 2018-01-20 DIAGNOSIS — K219 Gastro-esophageal reflux disease without esophagitis: Secondary | ICD-10-CM

## 2018-01-25 ENCOUNTER — Encounter: Payer: Self-pay | Admitting: Gastroenterology

## 2018-02-12 ENCOUNTER — Other Ambulatory Visit: Payer: Self-pay | Admitting: Family Medicine

## 2018-02-12 DIAGNOSIS — E559 Vitamin D deficiency, unspecified: Secondary | ICD-10-CM

## 2018-02-21 DIAGNOSIS — Z5181 Encounter for therapeutic drug level monitoring: Secondary | ICD-10-CM | POA: Diagnosis not present

## 2018-02-21 DIAGNOSIS — M1990 Unspecified osteoarthritis, unspecified site: Secondary | ICD-10-CM | POA: Diagnosis not present

## 2018-02-21 DIAGNOSIS — M4322 Fusion of spine, cervical region: Secondary | ICD-10-CM | POA: Diagnosis not present

## 2018-02-21 DIAGNOSIS — M4310 Spondylolisthesis, site unspecified: Secondary | ICD-10-CM | POA: Diagnosis not present

## 2018-03-21 DIAGNOSIS — M1990 Unspecified osteoarthritis, unspecified site: Secondary | ICD-10-CM | POA: Diagnosis not present

## 2018-03-21 DIAGNOSIS — M4310 Spondylolisthesis, site unspecified: Secondary | ICD-10-CM | POA: Diagnosis not present

## 2018-03-21 DIAGNOSIS — Z5181 Encounter for therapeutic drug level monitoring: Secondary | ICD-10-CM | POA: Diagnosis not present

## 2018-03-21 DIAGNOSIS — M4322 Fusion of spine, cervical region: Secondary | ICD-10-CM | POA: Diagnosis not present

## 2018-04-18 ENCOUNTER — Ambulatory Visit: Payer: Medicare HMO | Admitting: Family Medicine

## 2018-04-21 ENCOUNTER — Other Ambulatory Visit: Payer: Self-pay | Admitting: Family Medicine

## 2018-04-21 DIAGNOSIS — G43C0 Periodic headache syndromes in child or adult, not intractable: Secondary | ICD-10-CM

## 2018-04-21 DIAGNOSIS — K219 Gastro-esophageal reflux disease without esophagitis: Secondary | ICD-10-CM

## 2018-04-21 DIAGNOSIS — F339 Major depressive disorder, recurrent, unspecified: Secondary | ICD-10-CM

## 2018-05-03 ENCOUNTER — Encounter: Payer: Self-pay | Admitting: Family Medicine

## 2018-05-03 ENCOUNTER — Ambulatory Visit (INDEPENDENT_AMBULATORY_CARE_PROVIDER_SITE_OTHER): Payer: Medicare HMO | Admitting: Family Medicine

## 2018-05-03 VITALS — BP 144/100 | HR 84 | Ht 70.0 in | Wt 200.0 lb

## 2018-05-03 DIAGNOSIS — K219 Gastro-esophageal reflux disease without esophagitis: Secondary | ICD-10-CM | POA: Diagnosis not present

## 2018-05-03 DIAGNOSIS — G43C0 Periodic headache syndromes in child or adult, not intractable: Secondary | ICD-10-CM

## 2018-05-03 DIAGNOSIS — Z1211 Encounter for screening for malignant neoplasm of colon: Secondary | ICD-10-CM

## 2018-05-03 DIAGNOSIS — I10 Essential (primary) hypertension: Secondary | ICD-10-CM

## 2018-05-03 DIAGNOSIS — E782 Mixed hyperlipidemia: Secondary | ICD-10-CM

## 2018-05-03 DIAGNOSIS — Z23 Encounter for immunization: Secondary | ICD-10-CM

## 2018-05-03 DIAGNOSIS — F339 Major depressive disorder, recurrent, unspecified: Secondary | ICD-10-CM | POA: Diagnosis not present

## 2018-05-03 DIAGNOSIS — R69 Illness, unspecified: Secondary | ICD-10-CM

## 2018-05-03 DIAGNOSIS — F419 Anxiety disorder, unspecified: Secondary | ICD-10-CM

## 2018-05-03 DIAGNOSIS — Z1239 Encounter for other screening for malignant neoplasm of breast: Secondary | ICD-10-CM

## 2018-05-03 MED ORDER — ESOMEPRAZOLE MAGNESIUM 40 MG PO CPDR: 40.0000 mg | DELAYED_RELEASE_CAPSULE | Freq: Every day | ORAL | 1 refills | Status: DC

## 2018-05-03 MED ORDER — EZETIMIBE 10 MG PO TABS: 10.0000 mg | ORAL_TABLET | Freq: Every day | ORAL | 1 refills | Status: DC

## 2018-05-03 MED ORDER — SIMVASTATIN 80 MG PO TABS: ORAL_TABLET | ORAL | 1 refills | Status: DC

## 2018-05-03 MED ORDER — METOPROLOL SUCCINATE ER 100 MG PO TB24: 100.0000 mg | ORAL_TABLET | Freq: Every day | ORAL | 1 refills | Status: DC

## 2018-05-03 MED ORDER — BUPROPION HCL ER (XL) 150 MG PO TB24: ORAL_TABLET | ORAL | 1 refills | Status: DC

## 2018-05-03 MED ORDER — TOPIRAMATE 50 MG PO TABS: 50.0000 mg | ORAL_TABLET | Freq: Two times a day (BID) | ORAL | 5 refills | Status: DC

## 2018-05-03 MED ORDER — BUSPIRONE HCL 30 MG PO TABS: 30.0000 mg | ORAL_TABLET | Freq: Two times a day (BID) | ORAL | 1 refills | Status: DC

## 2018-05-03 NOTE — Progress Notes (Signed)
Date:  05/03/2018   Name:  Jamie Kim   DOB:  [birth year]   MRN:  [id number]   Chief Complaint: Hyperlipidemia; Hypertension; Headache; Gastroesophageal Reflux; and Depression Hyperlipidemia  This is a chronic problem. The current episode started more than 1 year ago. The problem is controlled. Recent lipid tests were reviewed and are normal. She has no history of chronic renal disease, diabetes, hypothyroidism, liver disease, obesity or nephrotic syndrome. There are no known factors aggravating her hyperlipidemia. Pertinent negatives include no chest pain, focal sensory loss, focal weakness, leg pain, myalgias or shortness of breath. She is currently on no antihyperlipidemic treatment. The current treatment provides mild improvement of lipids. There are no compliance problems.  Risk factors for coronary artery disease include obesity, hypertension, post-menopausal and dyslipidemia.  Hypertension  This is a chronic problem. The current episode started more than 1 year ago. The problem has been waxing and waning since onset. The problem is controlled. Associated symptoms include headaches. Pertinent negatives include no anxiety, blurred vision, chest pain, malaise/fatigue, neck pain, orthopnea, palpitations, peripheral edema, PND, shortness of breath or sweats. There are no associated agents to hypertension. Risk factors for coronary artery disease include dyslipidemia, obesity, post-menopausal state and smoking/tobacco exposure. Past treatments include beta blockers. The current treatment provides moderate improvement. There are no compliance problems.  There is no history of angina, kidney disease, CAD/MI, CVA, heart failure, left ventricular hypertrophy, PVD or retinopathy. There is no history of chronic renal disease.  Headache   This is a chronic problem. The current episode started more than 1 year ago. The problem occurs constantly. The problem has been unchanged. The quality of the pain  is described as aching. The pain is moderate. Pertinent negatives include no abdominal pain, back pain, blurred vision, coughing, dizziness, ear pain, eye pain, eye redness, fever, insomnia, nausea, neck pain, numbness, photophobia, rhinorrhea, sinus pressure, sore throat, vomiting or weakness. The symptoms are aggravated by emotional stress. Her past medical history is significant for hypertension. There is no history of obesity.  Gastroesophageal Reflux  She reports no abdominal pain, no belching, no chest pain, no choking, no coughing, no dysphagia, no early satiety, no globus sensation, no heartburn, no hoarse voice, no nausea, no sore throat, no stridor, no tooth decay, no water brash or no wheezing. This is a chronic problem. The problem occurs constantly. The problem has been unchanged. The symptoms are aggravated by certain foods. Pertinent negatives include no fatigue. She has tried a PPI and surgery for the symptoms. The treatment provided moderate relief.  Depression         This is a chronic problem.  The current episode started more than 1 year ago.   The onset quality is gradual.   The problem has been waxing and waning since onset.  Associated symptoms include headaches.  Associated symptoms include no fatigue, no helplessness, no hopelessness, does not have insomnia, no restlessness, no decreased interest, no myalgias, no indigestion and no suicidal ideas.  Past treatments include SNRIs - Serotonin and norepinephrine reuptake inhibitors and other medications.  Compliance with treatment is good.  Previous treatment provided mild relief.   Pertinent negatives include no hypothyroidism and no anxiety. Anxiety  Presents for follow-up visit. Symptoms include excessive worry and nervous/anxious behavior. Patient reports no chest pain, dizziness, insomnia, nausea, obsessions, palpitations, restlessness, shortness of breath or suicidal ideas.       Review of Systems  Constitutional: Negative.   Negative for chills, fatigue, fever, malaise/fatigue  and unexpected weight change.  HENT: Negative for congestion, ear discharge, ear pain, hoarse voice, rhinorrhea, sinus pressure, sneezing and sore throat.   Eyes: Negative for blurred vision, photophobia, pain, discharge, redness and itching.  Respiratory: Negative for cough, choking, shortness of breath, wheezing and stridor.   Cardiovascular: Negative for chest pain, palpitations, orthopnea and PND.  Gastrointestinal: Negative for abdominal pain, blood in stool, constipation, diarrhea, dysphagia, heartburn, nausea and vomiting.  Endocrine: Negative for cold intolerance, heat intolerance, polydipsia, polyphagia and polyuria.  Genitourinary: Negative for dysuria, flank pain, frequency, hematuria, menstrual problem, pelvic pain, urgency, vaginal bleeding and vaginal discharge.  Musculoskeletal: Negative for arthralgias, back pain, myalgias and neck pain.  Skin: Negative for rash.  Allergic/Immunologic: Negative for environmental allergies and food allergies.  Neurological: Positive for headaches. Negative for dizziness, focal weakness, weakness, light-headedness and numbness.  Hematological: Negative for adenopathy. Does not bruise/bleed easily.  Psychiatric/Behavioral: Positive for depression. Negative for dysphoric mood and suicidal ideas. The patient is nervous/anxious. The patient does not have insomnia.     Patient Active Problem List   Diagnosis Date Noted  . Essential hypertension 09/03/2016  . Periodic headache syndrome, not intractable 09/03/2016  . Gastroesophageal reflux disease 09/03/2016  . Multiple thyroid nodules 09/03/2016  . Vitamin D deficiency 09/03/2016  . Chronic anxiety 09/03/2016  . Depression, recurrent ([place]) 09/03/2016  . DDD (degenerative disc disease), lumbar 02/26/2015  . Bilateral occipital neuralgia 01/27/2015  . Spinal stenosis of lumbar region 11/25/2014  . DDD (degenerative disc disease), cervical  11/24/2014  . Status post cervical spinal fusion 11/24/2014  . Lumbosacral facet joint syndrome 11/24/2014  . Sacroiliac joint dysfunction of both sides 11/24/2014  . Greater trochanteric bursitis of both hips 11/24/2014  . Cervical facet syndrome 11/24/2014  . Hyperlipidemia   . Chest pain 10/26/2012  . Palpitations 10/26/2012    Allergies  Allergen Reactions  . Codeine Itching  . Hydrocodone Itching  . Morphine Itching  . Morphine And Related   . Oxybutynin Itching  . Oxycodone Itching  . Tramadol-Acetaminophen Itching    Past Surgical History:  Procedure Laterality Date  . ANTERIOR CERVICAL DECOMP/DISCECTOMY FUSION    . BLADDER SUSPENSION    . CARDIAC CATHETERIZATION  11/23/12   [facility];EF 65%  . CARPAL TUNNEL RELEASE Bilateral   . CHOLECYSTECTOMY    . HERNIA REPAIR    . KNEE SURGERY Left   . LAPAROSCOPY    . NECK SURGERY    . NISSEN FUNDOPLICATION    . TONSILLECTOMY    . TOTAL ABDOMINAL HYSTERECTOMY      Social History   Tobacco Use  . Smoking status: Former Smoker    Packs/day: 0.50    Years: 20.00    Pack years: 10.00    Types: Cigarettes  . Smokeless tobacco: Never Used  Substance Use Topics  . Alcohol use: No    Alcohol/week: 0.0 standard drinks    Comment: 1 month  . Drug use: No     Medication list has been reviewed and updated.  Current Meds  Medication Sig  . acetaminophen (TYLENOL) 325 MG tablet Take 650 mg by mouth every 6 (six) hours as needed.  Marland Kitchen buPROPion (WELLBUTRIN XL) 150 MG 24 hr tablet TAKE 1 TABLET BY MOUTH EVERY DAY IN THE MORNING  . busPIRone (BUSPAR) 30 MG tablet Take 1 tablet (30 mg total) by mouth 2 (two) times daily.  . DULoxetine (CYMBALTA) 60 MG capsule Take 1-2 mg by mouth daily. Dr Primus Bravo  . esomeprazole ([place]) 40  MG capsule Take 1 capsule (40 mg total) by mouth daily. TAKE ONE CAPSULE BY MOUTH EVERY DAY AS DIRECTED BY PROVIDER (pt buys otc to get BID)  . ezetimibe (ZETIA) 10 MG tablet Take 1 tablet (10 mg total) by mouth  daily. Reported on 10/15/2015  . gabapentin (NEURONTIN) 600 MG tablet Take 600 mg by mouth 2 (two) times daily. Dr Primus Bravo  . methadone (DOLOPHINE) 5 MG tablet Limit 2-3 tabs by mouth per day if tolerated (Patient taking differently: Take 5 mg by mouth. Limit 2-3 tabs by mouth per day if tolerated- Dr Primus Bravo)  . metoprolol succinate (TOPROL-XL) 100 MG 24 hr tablet Take 1 tablet (100 mg total) by mouth daily. Take with or immediately following a meal.  . Multiple Vitamin (MULTI-VITAMINS) TABS Take by mouth.  . simvastatin (ZOCOR) 80 MG tablet TAKE 1 TABLET BY MOUTH EVERYDAY AT BEDTIME  . tiZANidine (ZANAFLEX) 2 MG tablet Limit  2-4 tablets by mouth per day tolerated (Patient taking differently: Take 2 mg by mouth once. Limit  2-4 tablets by mouth per day tolerated/ Dr Primus Bravo)  . tolterodine (DETROL LA) 4 MG 24 hr capsule Take 1 capsule (4 mg total) by mouth daily. [employer]  . topiramate (TOPAMAX) 50 MG tablet Take 1 tablet (50 mg total) by mouth 2 (two) times daily.  . Vitamin D, Ergocalciferol, (DRISDOL) 50000 units CAPS capsule TAKE 1 CAPSULE BY MOUTH ONCE A WEEK  . [DISCONTINUED] buPROPion (WELLBUTRIN XL) 150 MG 24 hr tablet TAKE 1 TABLET BY MOUTH EVERY DAY IN THE MORNING  . [DISCONTINUED] busPIRone (BUSPAR) 30 MG tablet TAKE 1 TABLET (30 MG TOTAL) BY MOUTH 2 (TWO) TIMES DAILY.  . [DISCONTINUED] esomeprazole (NEXIUM) 40 MG capsule Take 1 capsule (40 mg total) by mouth 2 (two) times daily.  . [DISCONTINUED] esomeprazole (NEXIUM) 40 MG capsule TAKE ONE CAPSULE BY MOUTH EVERY DAY AS DIRECTED BY PROVIDER (NEEDS APPOINTMENT) (Patient taking differently: Take 40 mg by mouth daily. TAKE ONE CAPSULE BY MOUTH EVERY DAY AS DIRECTED BY PROVIDER (pt buys otc to get BID))  . [DISCONTINUED] ezetimibe (ZETIA) 10 MG tablet Take 1 tablet (10 mg total) by mouth daily. Reported on 10/15/2015  . [DISCONTINUED] metoprolol succinate (TOPROL-XL) 100 MG 24 hr tablet Take 1 tablet (100 mg total) by mouth daily. Take with or  immediately following a meal.  . [DISCONTINUED] simvastatin (ZOCOR) 80 MG tablet TAKE 1 TABLET BY MOUTH EVERYDAY AT BEDTIME  . [DISCONTINUED] topiramate (TOPAMAX) 50 MG tablet Take 1 tablet (50 mg total) by mouth 2 (two) times daily. (Patient taking differently: Take 50 mg by mouth 2 (two) times daily. Dr Primus Bravo)   Current Facility-Administered Medications for the 05/03/18 encounter (Office Visit) with Juline Patch, MD  Medication  . bupivacaine (MARCAINE) 0.5 % injection 30 mL  . bupivacaine (PF) (MARCAINE) 0.25 % injection 30 mL  . bupivacaine (PF) (MARCAINE) 0.25 % injection 30 mL  . fentaNYL (SUBLIMAZE) injection 100 mcg  . fentaNYL (SUBLIMAZE) injection 100 mcg  . fentaNYL (SUBLIMAZE) injection 100 mcg  . lactated ringers infusion 1,000 mL  . lactated ringers infusion 1,000 mL  . lactated ringers infusion 1,000 mL  . lactated ringers infusion 1,000 mL  . lidocaine (PF) (XYLOCAINE) 1 % injection 10 mL  . midazolam (VERSED) 5 MG/5ML injection 5 mg  . midazolam (VERSED) 5 MG/5ML injection 5 mg  . midazolam (VERSED) 5 MG/5ML injection 5 mg  . orphenadrine (NORFLEX) injection 60 mg  . orphenadrine (NORFLEX) injection 60 mg  .  orphenadrine (NORFLEX) injection 60 mg  . sodium chloride 0.9 % injection 20 mL  . triamcinolone acetonide (KENALOG-40) injection 40 mg  . triamcinolone acetonide (KENALOG-40) injection 40 mg  . triamcinolone acetonide (KENALOG-40) injection 40 mg    PHQ 2/9 Scores 05/03/2018 02/17/2016 01/20/2016 12/31/2015  PHQ - 2 Score 0 0 0 0  PHQ- 9 Score 0 - - -  Exception Documentation - - - -    Physical Exam  Constitutional: She is oriented to person, place, and time. She appears well-developed and well-nourished.  HENT:  Head: Normocephalic.  Right Ear: External ear normal.  Left Ear: External ear normal.  Mouth/Throat: Oropharynx is clear and moist.  Eyes: Pupils are equal, round, and reactive to light. Conjunctivae and EOM are normal. Lids are everted and  swept, no foreign bodies found. Left eye exhibits no hordeolum. No foreign body present in the left eye. Right conjunctiva is not injected. Left conjunctiva is not injected. No scleral icterus.  Neck: Normal range of motion. Neck supple. No JVD present. No tracheal deviation present. No thyromegaly present.  Cardiovascular: Normal rate, regular rhythm, normal heart sounds and intact distal pulses. Exam reveals no gallop and no friction rub.  No murmur heard. Pulmonary/Chest: Effort normal and breath sounds normal. No respiratory distress. She has no wheezes. She has no rales.  Abdominal: Soft. Bowel sounds are normal. She exhibits no mass. There is no hepatosplenomegaly. There is no tenderness. There is no rebound and no guarding.  Musculoskeletal: Normal range of motion. She exhibits no edema or tenderness.  Lymphadenopathy:    She has no cervical adenopathy.  Neurological: She is alert and oriented to person, place, and time. She has normal strength. She displays normal reflexes. No cranial nerve deficit.  Skin: Skin is warm. Capillary refill takes less than 2 seconds. No rash noted.  Psychiatric: She has a normal mood and affect. Her mood appears not anxious. She does not exhibit a depressed mood.  Nursing note and vitals reviewed.   BP (!) 144/100   Pulse 84   Ht 5\' 10"  (1.778 m)   Wt 200 lb (90.7 kg)   BMI 28.70 kg/m   Assessment and Plan: 1. Depression, recurrent ([place]) Stable on meds- refill bupropion - buPROPion (WELLBUTRIN XL) 150 MG 24 hr tablet; TAKE 1 TABLET BY MOUTH EVERY DAY IN THE MORNING  Dispense: 90 tablet; Refill: 1  2. Chronic anxiety Stable on meds- refill Buspar - busPIRone (BUSPAR) 30 MG tablet; Take 1 tablet (30 mg total) by mouth 2 (two) times daily.  Dispense: 180 tablet; Refill: 1  3. Gastroesophageal reflux disease, esophagitis presence not specified Stable on med- refill Nexium- pt will take prescription dose qday and pick up otc dose to take qday due to  insurance not paying for BID prescription dosing - esomeprazole (NEXIUM) 40 MG capsule; Take 1 capsule (40 mg total) by mouth daily. TAKE ONE CAPSULE BY MOUTH EVERY DAY AS DIRECTED BY PROVIDER (pt buys otc to get BID)  Dispense: 90 capsule; Refill: 1  4. Mixed hyperlipidemia Stable on meds- refill Zetia and Simvastatin/ draw lipid - ezetimibe (ZETIA) 10 MG tablet; Take 1 tablet (10 mg total) by mouth daily. Reported on 10/15/2015  Dispense: 90 tablet; Refill: 1 - simvastatin (ZOCOR) 80 MG tablet; TAKE 1 TABLET BY MOUTH EVERYDAY AT BEDTIME  Dispense: 90 tablet; Refill: 1 - Lipid Panel With LDL/HDL Ratio  5. Essential hypertension Stable on med- refill metoprolol/ draw renal panel - metoprolol succinate (TOPROL-XL) 100 MG 24  hr tablet; Take 1 tablet (100 mg total) by mouth daily. Take with or immediately following a meal.  Dispense: 90 tablet; Refill: 1 - Renal Function Panel  6. Periodic headache syndrome, not intractable Stable on med- refill BID/ insurance doesn't pay- will need to get in with specialist - topiramate (TOPAMAX) 50 MG tablet; Take 1 tablet (50 mg total) by mouth 2 (two) times daily.  Dispense: 60 tablet; Refill: 5  7. Taking medication for chronic disease Draw hepatic - Hepatic function panel  8. Colon cancer screening Refer to GI - Ambulatory referral to Gastroenterology  9. Breast cancer screening Scheduled mammo for 05/15/18 @ 2:20 in [place]  10. Influenza vaccine needed administered - Flu Vaccine QUAD 36+ mos IM sched mammo for 05/15/2018 @ 2:20 in [place] I spent 40 minutes with this patient, More than 50% of that time was spent in face to face education, counseling and care coordination. Dr. [facility]  05/03/2018

## 2018-05-04 LAB — RENAL FUNCTION PANEL
Albumin: 4.4 g/dL (ref 3.5–5.5)
BUN / CREAT RATIO: 20 (ref 9–23)
BUN: 15 mg/dL (ref 6–24)
CALCIUM: 9.7 mg/dL (ref 8.7–10.2)
CO2: 23 mmol/L (ref 20–29)
CREATININE: 0.74 mg/dL (ref 0.57–1.00)
Chloride: 105 mmol/L (ref 96–106)
GFR calc Af Amer: 105 mL/min/{1.73_m2} (ref >59)
GFR calc non Af Amer: 91 mL/min/{1.73_m2} (ref >59)
Glucose: 107 mg/dL — ABNORMAL HIGH (ref 65–99)
Phosphorus: 4.6 mg/dL — ABNORMAL HIGH (ref 2.5–4.5)
Potassium: 4.3 mmol/L (ref 3.5–5.2)
SODIUM: 141 mmol/L (ref 134–144)

## 2018-05-04 LAB — LIPID PANEL WITH LDL/HDL RATIO
CHOLESTEROL TOTAL: 144 mg/dL (ref 100–199)
HDL: 54 mg/dL (ref >39)
LDL Calculated: 55 mg/dL (ref 0–99)
LDl/HDL Ratio: 1 ratio (ref 0.0–3.2)
Triglycerides: 175 mg/dL — ABNORMAL HIGH (ref 0–149)
VLDL CHOLESTEROL CAL: 35 mg/dL (ref 5–40)

## 2018-05-04 LAB — HEPATIC FUNCTION PANEL
ALK PHOS: 70 IU/L (ref 39–117)
ALT: 14 IU/L (ref 0–32)
AST: 17 IU/L (ref 0–40)
BILIRUBIN TOTAL: 0.2 mg/dL (ref 0.0–1.2)
Bilirubin, Direct: 0.08 mg/dL (ref 0.00–0.40)
Total Protein: 6.6 g/dL (ref 6.0–8.5)

## 2018-05-05 DIAGNOSIS — M1990 Unspecified osteoarthritis, unspecified site: Secondary | ICD-10-CM | POA: Diagnosis not present

## 2018-05-05 DIAGNOSIS — Z5181 Encounter for therapeutic drug level monitoring: Secondary | ICD-10-CM | POA: Diagnosis not present

## 2018-05-05 DIAGNOSIS — M4310 Spondylolisthesis, site unspecified: Secondary | ICD-10-CM | POA: Diagnosis not present

## 2018-05-05 DIAGNOSIS — M4322 Fusion of spine, cervical region: Secondary | ICD-10-CM | POA: Diagnosis not present

## 2018-05-15 ENCOUNTER — Encounter: Payer: Self-pay | Admitting: *Deleted

## 2018-05-15 ENCOUNTER — Ambulatory Visit
Admission: RE | Admit: 2018-05-15 | Discharge: 2018-05-15 | Disposition: A | Discharge status: Home / Self Care | Payer: Medicare HMO | Source: Ambulatory Visit | Attending: Family Medicine | Admitting: Family Medicine

## 2018-05-15 DIAGNOSIS — Z1231 Encounter for screening mammogram for malignant neoplasm of breast: Secondary | ICD-10-CM | POA: Diagnosis not present

## 2018-05-15 DIAGNOSIS — Z1239 Encounter for other screening for malignant neoplasm of breast: Secondary | ICD-10-CM

## 2018-05-31 ENCOUNTER — Encounter: Payer: Self-pay | Admitting: Gastroenterology

## 2018-05-31 ENCOUNTER — Ambulatory Visit (INDEPENDENT_AMBULATORY_CARE_PROVIDER_SITE_OTHER): Payer: Medicare HMO | Admitting: Gastroenterology

## 2018-05-31 VITALS — BP 137/82 | HR 69 | Ht 70.0 in | Wt 203.8 lb

## 2018-05-31 DIAGNOSIS — K219 Gastro-esophageal reflux disease without esophagitis: Secondary | ICD-10-CM | POA: Diagnosis not present

## 2018-05-31 NOTE — Patient Instructions (Signed)
F/U 6 months   Gastroesophageal Reflux Disease, Adult Normally, food travels down the esophagus and stays in the stomach to be digested. If a person has gastroesophageal reflux disease (GERD), food and stomach acid move back up into the esophagus. When this happens, the esophagus becomes sore and swollen (inflamed). Over time, GERD can make small holes (ulcers) in the lining of the esophagus. Follow these instructions at home: Diet  Follow a diet as told by your doctor. You may need to avoid foods and drinks such as: ? Coffee and tea (with or without caffeine). ? Drinks that contain alcohol. ? Energy drinks and sports drinks. ? Carbonated drinks or sodas. ? Chocolate and cocoa. ? Peppermint and mint flavorings. ? Garlic and onions. ? Horseradish. ? Spicy and acidic foods, such as peppers, chili powder, curry powder, vinegar, hot sauces, and BBQ sauce. ? Citrus fruit juices and citrus fruits, such as oranges, lemons, and limes. ? Tomato-based foods, such as red sauce, chili, salsa, and pizza with red sauce. ? Fried and fatty foods, such as donuts, french fries, potato chips, and high-fat dressings. ? High-fat meats, such as hot dogs, rib eye steak, sausage, ham, and bacon. ? High-fat dairy items, such as whole milk, butter, and cream cheese.  Eat small meals often. Avoid eating large meals.  Avoid drinking large amounts of liquid with your meals.  Avoid eating meals during the 2-3 hours before bedtime.  Avoid lying down right after you eat.  Do not exercise right after you eat. General instructions  Pay attention to any changes in your symptoms.  Take over-the-counter and prescription medicines only as told by your doctor. Do not take aspirin, ibuprofen, or other NSAIDs unless your doctor says it is okay.  Do not use any tobacco products, including cigarettes, chewing tobacco, and e-cigarettes. If you need help quitting, ask your doctor.  Wear loose clothes. Do not wear  anything tight around your waist.  Raise (elevate) the head of your bed about 6 inches (15 cm).  Try to lower your stress. If you need help doing this, ask your doctor.  If you are overweight, lose an amount of weight that is healthy for you. Ask your doctor about a safe weight loss goal.  Keep all follow-up visits as told by your doctor. This is important. Contact a doctor if:  You have new symptoms.  You lose weight and you do not know why it is happening.  You have trouble swallowing, or it hurts to swallow.  You have wheezing or a cough that keeps happening.  Your symptoms do not get better with treatment.  You have a hoarse voice. Get help right away if:  You have pain in your arms, neck, jaw, teeth, or back.  You feel sweaty, dizzy, or light-headed.  You have chest pain or shortness of breath.  You throw up (vomit) and your throw up looks like blood or coffee grounds.  You pass out (faint).  Your poop (stool) is bloody or black.  You cannot swallow, drink, or eat. This information is not intended to replace advice given to you by your health care provider. Make sure you discuss any questions you have with your health care provider. Document Released: 12/08/2007 Document Revised: 11/27/2015 Document Reviewed: 10/16/2014 Elsevier Interactive Patient Education  [employer].

## 2018-05-31 NOTE — Progress Notes (Signed)
Jamie Kim [address]  [place]  [place], [place] [address]  Main: [phone]  Fax: [phone]   Gastroenterology Consultation  Referring Provider:     Juline Patch, MD Primary Care Physician:  Juline Patch, MD Primary Gastroenterologist:  Dr. Vonda Kim Reason for Consultation:     Reflux, screening colonoscopy        HPI:    Chief Complaint  Patient presents with  . New Patient (Initial Visit)    referred by Dr. Coralie Common, MD for severe reflux and discuss screening colonoscopy    Jamie Kim is a 57 y.o. y/o female referred for consultation & management  by Dr. Juline Patch, MD.  Patient reports chronic history of reflux, and history of hiatal hernia surgery due to it.  Currently taking Nexium twice daily as she states without the twice daily dosing she has severe reflux.  Reports breakthrough symptoms once or twice a week.  No dysphagia.  Sleeps on a reclining bed.  No constipation or diarrhea.  No abdominal pain.  No nausea or vomiting.  No altered bowel habits, no weight loss.  Family history of colon cancer in father.  Previous procedure reports available and provation Last EGD in October 2012 by Dr. Vira Agar.  Gastric erythema, intact fundoplication.  Normal duodenum. Colonoscopy October 2012 by Dr. Vira Agar.  Good prep, extent of exam cecum.  No polyps.  Linear streaks reported in rectum and biopsies obtained.  Internal hemorrhoids.  Diagnosis:  Part A: ANTRUM AND BODY OF STOMACH BIOPSY:  - ANTRAL AND OXYNTIC MUCOSA, NO PATHOLOGIC CHANGES.  - DIFF-QUIK STAIN IS NEGATIVE FOR ORGANISMS CONSISTENT WITH  HELICOBACTER PYLORI. CONTROL STAINED APPROPRIATELY.  .  Part B: CARDIA BIOPSY:  - SQUAMOCOLUMNAR JUNCTION MUCOSA WITH FOCAL INTESTINAL METAPLASIA  AND MILD CHRONIC INFLAMMATION.  - GASTRIC OXYNTIC MUCOSA WITHOUT PATHOLOGIC CHANGES.  - DIFF-QUIK STAIN IS NEGATIVE FOR ORGANISMS CONSISTENT WITH  HELICOBACTER PYLORI. CONTROL  STAINED APPROPRIATELY.  .  NOTE: The significance of focal intestinal metaplasia at the  squamocolumnar junction should be assessed by clinical  correlation.  .  Part C: RECTAL LINEAR EROSION BIOPSY:  - EROSION WITH ISCHEMIC-TYPE CHANGES AND NORMAL ADJACENT MUCOSA.  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 There is  no evidence of chronic inflammatory bowel disease in this sample.  Clinical correlation is recommended.   Colonoscopy 2005, internal hemorrhoids.  Past Medical History:  Diagnosis Date  . Allergy   . Anxiety   . Back pain, chronic   . DDD (degenerative disc disease), cervical    lumbar  . Depression   . Fibromyalgia   . Fibromyalgia   . GERD (gastroesophageal reflux disease)   . H/O scarlet fever   . Heart murmur    hx  . Hyperlipidemia   . Hypertension   . Migraines   . Multiple thyroid nodules   . Sleep apnea   . Vitamin D deficiency     Past Surgical History:  Procedure Laterality Date  . ANTERIOR CERVICAL DECOMP/DISCECTOMY FUSION    . BLADDER SUSPENSION    . CARDIAC CATHETERIZATION  11/23/12   [facility];EF 65%  . CARPAL TUNNEL RELEASE Bilateral   . CHOLECYSTECTOMY    . HERNIA REPAIR    . KNEE SURGERY Left   . LAPAROSCOPY    . NECK SURGERY    . NISSEN FUNDOPLICATION    . TONSILLECTOMY    . TOTAL ABDOMINAL  HYSTERECTOMY      Prior to Admission medications   Medication Sig Start Date End Date Taking? Authorizing Provider  acetaminophen (TYLENOL) 325 MG tablet Take 650 mg by mouth every 6 (six) hours as needed.   Yes [provider]  buPROPion (WELLBUTRIN XL) 150 MG 24 hr tablet TAKE 1 TABLET BY MOUTH EVERY DAY IN THE MORNING 05/03/18  Yes Juline Patch, MD  busPIRone (BUSPAR) 30 MG tablet Take 1 tablet (30 mg total) by mouth 2 (two) times daily. 05/03/18  Yes Juline Patch, MD  DULoxetine (CYMBALTA) 60 MG capsule Take 1-2 mg by mouth daily. Dr Primus Bravo 10/24/09   Yes [provider]  esomeprazole (NEXIUM) 40 MG capsule Take 1 capsule (40 mg total) by mouth daily. TAKE ONE CAPSULE BY MOUTH EVERY DAY AS DIRECTED BY PROVIDER (pt buys otc to get BID) 05/03/18  Yes Juline Patch, MD  ezetimibe (ZETIA) 10 MG tablet Take 1 tablet (10 mg total) by mouth daily. Reported on 10/15/2015 05/03/18  Yes Juline Patch, MD  gabapentin (NEURONTIN) 600 MG tablet Take 600 mg by mouth 2 (two) times daily. Dr Primus Bravo 10/24/09  Yes [provider]  methadone (DOLOPHINE) 5 MG tablet Limit 2-3 tabs by mouth per day if tolerated Patient taking differently: Take 5 mg by mouth. Limit 2-3 tabs by mouth per day if tolerated- Dr Primus Bravo 02/17/16  Yes Mohammed Kindle, MD  metoprolol succinate (TOPROL-XL) 100 MG 24 hr tablet Take 1 tablet (100 mg total) by mouth daily. Take with or immediately following a meal. 05/03/18  Yes Juline Patch, MD  Multiple Vitamin (MULTI-VITAMINS) TABS Take by mouth.   Yes [provider]  simvastatin (ZOCOR) 80 MG tablet TAKE 1 TABLET BY MOUTH EVERYDAY AT BEDTIME 05/03/18  Yes Juline Patch, MD  tiZANidine (ZANAFLEX) 2 MG tablet Limit  2-4 tablets by mouth per day tolerated Patient taking differently: Take 2 mg by mouth once. Limit  2-4 tablets by mouth per day tolerated/ Dr Primus Bravo 02/17/16  Yes Mohammed Kindle, MD  tolterodine (DETROL LA) 4 MG 24 hr capsule Take 1 capsule (4 mg total) by mouth daily. Melody Burr 11/24/17  Yes Shambley, Melody N, CNM  topiramate (TOPAMAX) 50 MG tablet Take 1 tablet (50 mg total) by mouth 2 (two) times daily. 05/03/18  Yes Juline Patch, MD  Vitamin D, Ergocalciferol, (DRISDOL) 50000 units CAPS capsule TAKE 1 CAPSULE BY MOUTH ONCE A WEEK 02/13/18  Yes Juline Patch, MD    Family History  Problem Relation Age of Onset  . Heart disease Mother   . Heart attack Mother        x2  . Cancer Father   . Melanoma Father   . Colon cancer Father   . Colon cancer Paternal Grandfather   . Breast cancer Neg  Hx      Social History   Tobacco Use  . Smoking status: Former Smoker    Packs/day: 0.50    Years: 20.00    Pack years: 10.00    Types: Cigarettes  . Smokeless tobacco: Never Used  Substance Use Topics  . Alcohol use: No    Alcohol/week: 0.0 standard drinks    Comment: 1 month  . Drug use: No    Allergies as of 05/31/2018 - Review Complete 05/31/2018  Allergen Reaction Noted  . Codeine Itching 10/26/2012  . Hydrocodone Itching 10/26/2012  . Morphine Itching 10/15/2014  . Morphine and related  10/26/2012  . Oxybutynin Itching 10/15/2014  .  Oxycodone Itching 10/15/2014  . Tramadol-acetaminophen Itching 10/15/2014    Review of Systems:    All systems reviewed and negative except where noted in HPI.   Physical Exam:  BP 137/82   Pulse 69   Ht 5\' 10"  (1.778 m)   Wt 203 lb 12.8 oz (92.4 kg)   BMI 29.24 kg/m  No LMP recorded. Patient has had a hysterectomy. Psych:  Alert and cooperative. Normal mood and affect. General:   Alert,  Well-developed, well-nourished, pleasant and cooperative in NAD Head:  Normocephalic and atraumatic. Eyes:  Sclera clear, no icterus.   Conjunctiva pink. Ears:  Normal auditory acuity. Nose:  No deformity, discharge, or lesions. Mouth:  No deformity or lesions,oropharynx pink & moist. Neck:  Supple; no masses or thyromegaly. Abdomen:  Normal bowel sounds.  No bruits.  Soft, non-tender and non-distended without masses, hepatosplenomegaly or hernias noted.  No guarding or rebound tenderness.    Msk:  Symmetrical without gross deformities. Good, equal movement & strength bilaterally. Pulses:  Normal pulses noted. Extremities:  No clubbing or edema.  No cyanosis. Neurologic:  Alert and oriented x3;  grossly normal neurologically. Skin:  Intact without significant lesions or rashes. No jaundice. Lymph Nodes:  No significant cervical adenopathy. Psych:  Alert and cooperative. Normal mood and affect.   Labs: CBC    Component Value Date/Time    WBC 4.9 11/20/2012 1157   WBC 8.5 10/02/2012 2012   RBC 3.96 11/20/2012 1157   RBC 4.22 10/02/2012 2012   HGB 12.2 11/20/2012 1157   HGB 13.3 10/02/2012 2012   HCT 36.0 11/20/2012 1157   HCT 38.2 10/02/2012 2012   PLT 396 (H) 11/20/2012 1157   PLT 391 10/02/2012 2012   MCV 91 11/20/2012 1157   MCV 91 10/02/2012 2012   MCH 30.8 11/20/2012 1157   MCH 31.4 10/02/2012 2012   MCHC 33.9 11/20/2012 1157   MCHC 34.7 10/02/2012 2012   RDW 13.6 11/20/2012 1157   RDW 12.9 10/02/2012 2012   LYMPHSABS 2.0 11/20/2012 1157   EOSABS 0.2 11/20/2012 1157   BASOSABS 0.0 11/20/2012 1157   CMP     Component Value Date/Time   NA 141 05/03/2018 1026   NA 137 10/02/2012 2012   K 4.3 05/03/2018 1026   K 3.9 10/02/2012 2012   CL 105 05/03/2018 1026   CL 105 10/02/2012 2012   CO2 23 05/03/2018 1026   CO2 22 10/02/2012 2012   GLUCOSE 107 (H) 05/03/2018 1026   GLUCOSE 107 (H) 10/02/2012 2012   BUN 15 05/03/2018 1026   BUN 16 10/02/2012 2012   CREATININE 0.74 05/03/2018 1026   CREATININE 1.05 10/02/2012 2012   CALCIUM 9.7 05/03/2018 1026   CALCIUM 9.6 10/02/2012 2012   PROT 6.6 05/03/2018 1026   PROT 7.8 10/02/2012 2012   ALBUMIN 4.4 05/03/2018 1026   ALBUMIN 4.2 10/02/2012 2012   AST 17 05/03/2018 1026   AST 18 10/02/2012 2012   ALT 14 05/03/2018 1026   ALT 22 10/02/2012 2012   ALKPHOS 70 05/03/2018 1026   ALKPHOS 61 10/02/2012 2012   BILITOT 0.2 05/03/2018 1026   BILITOT 0.2 10/02/2012 2012   GFRNONAA 91 05/03/2018 1026   GFRNONAA >60 10/02/2012 2012   GFRAA 105 05/03/2018 1026   GFRAA >60 10/02/2012 2012    Imaging Studies: Mm 3d Screen Breast Bilateral  Result Date: 05/16/2018 CLINICAL DATA:  Screening. EXAM: DIGITAL SCREENING BILATERAL MAMMOGRAM WITH TOMO AND CAD COMPARISON:  Previous exam(s). ACR Breast Density Category b:  There are scattered areas of fibroglandular density. FINDINGS: There are no findings suspicious for malignancy. Images were processed with CAD. IMPRESSION:  No mammographic evidence of malignancy. A result letter of this screening mammogram will be mailed directly to the patient. RECOMMENDATION: Screening mammogram in one year. (Code:SM-B-01Y) BI-RADS CATEGORY  1: Negative. Electronically Signed   By: Claudie Revering M.D.   On: 05/16/2018 12:36    Assessment and Plan:   IYANI DRESNER is a 57 y.o. y/o female has been referred for reflux, family history of colon cancer  Patient educated extensively on acid reflux lifestyle modification, including buying a bed wedge, not eating 3 hrs before bedtime, diet modifications, and handout given for the same.  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 Diff diarrhea, pneumonia, infections, CKD, electrolyte abnormalities. Pt. Verbalizes understanding and chooses to continue the medication.)  Also due for screening colonoscopy due to family history of colon cancer she should get one every 5 years  I have discussed alternative options, risks & benefits,  which include, but are not limited to, bleeding, infection, perforation,respiratory complication & drug reaction.  The patient agrees with this plan & written consent will be obtained.       Dr Jamie Kim  Speech recognition software was used to dictate the above note.

## 2018-06-05 ENCOUNTER — Other Ambulatory Visit: Payer: Self-pay

## 2018-06-05 DIAGNOSIS — Z1211 Encounter for screening for malignant neoplasm of colon: Secondary | ICD-10-CM

## 2018-06-05 DIAGNOSIS — K219 Gastro-esophageal reflux disease without esophagitis: Secondary | ICD-10-CM

## 2018-06-12 ENCOUNTER — Other Ambulatory Visit: Payer: Self-pay

## 2018-06-12 ENCOUNTER — Encounter: Payer: Self-pay | Admitting: *Deleted

## 2018-06-12 NOTE — Discharge Instructions (Signed)
General Anesthesia, Adult, Care After °These instructions provide you with information about caring for yourself after your procedure. Your health care provider may also give you more specific instructions. Your treatment has been planned according to current medical practices, but problems sometimes occur. Call your health care provider if you have any problems or questions after your procedure. °What can I expect after the procedure? °After the procedure, it is common to have: °· Vomiting. °· A sore throat. °· Mental slowness. ° °It is common to feel: °· Nauseous. °· Cold or shivery. °· Sleepy. °· Tired. °· Sore or achy, even in parts of your body where you did not have surgery. ° °Follow these instructions at home: °For at least 24 hours after the procedure: °· Do not: °? Participate in activities where you could fall or become injured. °? Drive. °? Use heavy machinery. °? Drink alcohol. °? Take sleeping pills or medicines that cause drowsiness. °? Make important decisions or sign legal documents. °? Take care of children on your own. °· Rest. °Eating and drinking °· If you vomit, drink water, juice, or soup when you can drink without vomiting. °· Drink enough fluid to keep your urine clear or pale yellow. °· Make sure you have little or no nausea before eating solid foods. °· Follow the diet recommended by your health care provider. °General instructions °· Have a responsible adult stay with you until you are awake and alert. °· Return to your normal activities as told by your health care provider. Ask your health care provider what activities are safe for you. °· Take over-the-counter and prescription medicines only as told by your health care provider. °· If you smoke, do not smoke without supervision. °· Keep all follow-up visits as told by your health care provider. This is important. °Contact a health care provider if: °· You continue to have nausea or vomiting at home, and medicines are not helpful. °· You  cannot drink fluids or start eating again. °· You cannot urinate after 8-12 hours. °· You develop a skin rash. °· You have fever. °· You have increasing redness at the site of your procedure. °Get help right away if: °· You have difficulty breathing. °· You have chest pain. °· You have unexpected bleeding. °· You feel that you are having a life-threatening or urgent problem. °This information is not intended to replace advice given to you by your health care provider. Make sure you discuss any questions you have with your health care provider. °Document Released: 09/27/2000 Document Revised: 11/24/2015 Document Reviewed: 06/05/2015 °Elsevier Interactive Patient Education © 2018 [employer] ° °

## 2018-06-12 NOTE — Anesthesia Preprocedure Evaluation (Addendum)
Anesthesia Evaluation  Patient identified by MRN, date of birth, ID band Patient awake    Reviewed: Allergy & Precautions, H&P , NPO status , Patient's Chart, lab work & pertinent test results  History of Anesthesia Complications (+) PONV and history of anesthetic complications  Airway Mallampati: II  TM Distance: >3 FB Neck ROM: full    Dental no notable dental hx.    Pulmonary sleep apnea , former smoker (quit 2018),    Pulmonary exam normal breath sounds clear to auscultation       Cardiovascular hypertension, Normal cardiovascular exam Rhythm:regular Rate:Normal     Neuro/Psych  Headaches, PSYCHIATRIC DISORDERS Anxiety Depression    GI/Hepatic GERD (s/p Nissen)  ,  Endo/Other    Renal/GU      Musculoskeletal  (+) Arthritis , Fibromyalgia -  Abdominal   Peds  Hematology   Anesthesia Other Findings   Reproductive/Obstetrics                            Anesthesia Physical Anesthesia Plan  ASA: II  Anesthesia Plan: General   Post-op Pain Management:    Induction: Intravenous  PONV Risk Score and Plan: 3 and Propofol infusion and Treatment may vary due to age or medical condition  Airway Management Planned: Natural Airway  Additional Equipment:   Intra-op Plan:   Post-operative Plan:   Informed Consent: I have reviewed the patients History and Physical, chart, labs and discussed the procedure including the risks, benefits and alternatives for the proposed anesthesia with the patient or authorized representative who has indicated his/her understanding and acceptance.     Plan Discussed with: CRNA  Anesthesia Plan Comments:        Anesthesia Quick Evaluation

## 2018-06-13 ENCOUNTER — Ambulatory Visit: Payer: Medicare HMO | Admitting: Anesthesiology

## 2018-06-13 ENCOUNTER — Encounter
Admission: AD | Disposition: A | Discharge status: Home / Self Care | Payer: Self-pay | Source: Home / Self Care | Attending: Gastroenterology

## 2018-06-13 ENCOUNTER — Ambulatory Visit
Admission: AD | Admit: 2018-06-13 | Discharge: 2018-06-13 | Disposition: A | Discharge status: Home / Self Care | Payer: Medicare HMO | Attending: Gastroenterology | Admitting: Gastroenterology

## 2018-06-13 DIAGNOSIS — R1310 Dysphagia, unspecified: Secondary | ICD-10-CM | POA: Insufficient documentation

## 2018-06-13 DIAGNOSIS — M549 Dorsalgia, unspecified: Secondary | ICD-10-CM | POA: Diagnosis not present

## 2018-06-13 DIAGNOSIS — G43909 Migraine, unspecified, not intractable, without status migrainosus: Secondary | ICD-10-CM | POA: Insufficient documentation

## 2018-06-13 DIAGNOSIS — G4730 Sleep apnea, unspecified: Secondary | ICD-10-CM | POA: Diagnosis not present

## 2018-06-13 DIAGNOSIS — D123 Benign neoplasm of transverse colon: Secondary | ICD-10-CM

## 2018-06-13 DIAGNOSIS — K2950 Unspecified chronic gastritis without bleeding: Secondary | ICD-10-CM | POA: Insufficient documentation

## 2018-06-13 DIAGNOSIS — K319 Disease of stomach and duodenum, unspecified: Secondary | ICD-10-CM | POA: Insufficient documentation

## 2018-06-13 DIAGNOSIS — K219 Gastro-esophageal reflux disease without esophagitis: Secondary | ICD-10-CM

## 2018-06-13 DIAGNOSIS — F329 Major depressive disorder, single episode, unspecified: Secondary | ICD-10-CM | POA: Insufficient documentation

## 2018-06-13 DIAGNOSIS — K2930 Chronic superficial gastritis without bleeding: Secondary | ICD-10-CM | POA: Diagnosis not present

## 2018-06-13 DIAGNOSIS — Z9889 Other specified postprocedural states: Secondary | ICD-10-CM | POA: Diagnosis not present

## 2018-06-13 DIAGNOSIS — Z87891 Personal history of nicotine dependence: Secondary | ICD-10-CM | POA: Insufficient documentation

## 2018-06-13 DIAGNOSIS — Z800 Family history of malignant neoplasm of digestive organs: Secondary | ICD-10-CM

## 2018-06-13 DIAGNOSIS — Z79899 Other long term (current) drug therapy: Secondary | ICD-10-CM | POA: Insufficient documentation

## 2018-06-13 DIAGNOSIS — D126 Benign neoplasm of colon, unspecified: Secondary | ICD-10-CM | POA: Diagnosis not present

## 2018-06-13 DIAGNOSIS — Z8719 Personal history of other diseases of the digestive system: Secondary | ICD-10-CM | POA: Diagnosis not present

## 2018-06-13 DIAGNOSIS — R12 Heartburn: Secondary | ICD-10-CM | POA: Diagnosis not present

## 2018-06-13 DIAGNOSIS — M797 Fibromyalgia: Secondary | ICD-10-CM | POA: Insufficient documentation

## 2018-06-13 DIAGNOSIS — K317 Polyp of stomach and duodenum: Secondary | ICD-10-CM | POA: Diagnosis not present

## 2018-06-13 DIAGNOSIS — I10 Essential (primary) hypertension: Secondary | ICD-10-CM | POA: Insufficient documentation

## 2018-06-13 DIAGNOSIS — E785 Hyperlipidemia, unspecified: Secondary | ICD-10-CM | POA: Insufficient documentation

## 2018-06-13 DIAGNOSIS — G8929 Other chronic pain: Secondary | ICD-10-CM | POA: Insufficient documentation

## 2018-06-13 DIAGNOSIS — D125 Benign neoplasm of sigmoid colon: Secondary | ICD-10-CM | POA: Diagnosis not present

## 2018-06-13 DIAGNOSIS — R1319 Other dysphagia: Secondary | ICD-10-CM

## 2018-06-13 DIAGNOSIS — K228 Other specified diseases of esophagus: Secondary | ICD-10-CM | POA: Diagnosis not present

## 2018-06-13 DIAGNOSIS — K635 Polyp of colon: Secondary | ICD-10-CM

## 2018-06-13 DIAGNOSIS — D122 Benign neoplasm of ascending colon: Secondary | ICD-10-CM | POA: Insufficient documentation

## 2018-06-13 DIAGNOSIS — Z1211 Encounter for screening for malignant neoplasm of colon: Secondary | ICD-10-CM | POA: Diagnosis not present

## 2018-06-13 DIAGNOSIS — Z860100 Personal history of colon polyps, unspecified: Secondary | ICD-10-CM

## 2018-06-13 DIAGNOSIS — Z9049 Acquired absence of other specified parts of digestive tract: Secondary | ICD-10-CM | POA: Insufficient documentation

## 2018-06-13 DIAGNOSIS — F419 Anxiety disorder, unspecified: Secondary | ICD-10-CM | POA: Diagnosis not present

## 2018-06-13 HISTORY — DX: Other specified postprocedural states: Z98.890

## 2018-06-13 HISTORY — PX: COLONOSCOPY WITH PROPOFOL: SHX5780

## 2018-06-13 HISTORY — PX: ESOPHAGOGASTRODUODENOSCOPY (EGD) WITH PROPOFOL: SHX5813

## 2018-06-13 HISTORY — DX: Motion sickness, initial encounter: T75.3XXA

## 2018-06-13 HISTORY — DX: Other specified postprocedural states: R11.2

## 2018-06-13 HISTORY — PX: POLYPECTOMY: SHX5525

## 2018-06-13 HISTORY — DX: Presence of dental prosthetic device (complete) (partial): Z97.2

## 2018-06-13 SURGERY — COLONOSCOPY WITH PROPOFOL
Anesthesia: General | Site: Throat

## 2018-06-13 MED ORDER — ACETAMINOPHEN 160 MG/5ML PO SOLN: 325.0000 mg | Freq: Once | ORAL | Status: DC

## 2018-06-13 MED ORDER — ACETAMINOPHEN 325 MG PO TABS: 325.0000 mg | ORAL_TABLET | Freq: Once | ORAL | Status: DC

## 2018-06-13 MED ORDER — GLYCOPYRROLATE 0.2 MG/ML IJ SOLN
INTRAMUSCULAR | Status: DC | PRN
  Administered 2018-06-13: 0.2 mg via INTRAVENOUS

## 2018-06-13 MED ORDER — LIDOCAINE HCL (CARDIAC) PF 100 MG/5ML IV SOSY
PREFILLED_SYRINGE | INTRAVENOUS | Status: DC | PRN
  Administered 2018-06-13: 40 mg via INTRAVENOUS

## 2018-06-13 MED ORDER — STERILE WATER FOR IRRIGATION IR SOLN
Status: DC | PRN
  Administered 2018-06-13 (×2)

## 2018-06-13 MED ORDER — PROPOFOL 10 MG/ML IV BOLUS
INTRAVENOUS | Status: DC | PRN
  Administered 2018-06-13: 30 mg via INTRAVENOUS
  Administered 2018-06-13 (×3): 20 mg via INTRAVENOUS
  Administered 2018-06-13: 30 mg via INTRAVENOUS
  Administered 2018-06-13 (×15): 20 mg via INTRAVENOUS
  Administered 2018-06-13: 80 mg via INTRAVENOUS
  Administered 2018-06-13: 30 mg via INTRAVENOUS
  Administered 2018-06-13 (×3): 20 mg via INTRAVENOUS

## 2018-06-13 MED ORDER — SODIUM CHLORIDE 0.9 % IV SOLN: INTRAVENOUS | Status: DC

## 2018-06-13 MED ORDER — LACTATED RINGERS IV SOLN: INTRAVENOUS | Status: DC

## 2018-06-13 MED ORDER — LACTATED RINGERS IV SOLN
INTRAVENOUS | Status: DC
  Administered 2018-06-13: 09:00:00 via INTRAVENOUS

## 2018-06-13 SURGICAL SUPPLY — 9 items
BLOCK BITE 60FR ADLT L/F GRN (MISCELLANEOUS) ×3 IMPLANT
CANISTER SUCT 1200ML W/VALVE (MISCELLANEOUS) ×3 IMPLANT
FORCEPS BIOP RAD 4 LRG CAP 4 (CUTTING FORCEPS) ×1 IMPLANT
GOWN CVR UNV OPN BCK APRN NK (MISCELLANEOUS) ×4 IMPLANT
GOWN ISOL THUMB LOOP REG UNIV (MISCELLANEOUS) ×6
KIT ENDO PROCEDURE OLY (KITS) ×3 IMPLANT
SNARE COLD EXACTO (MISCELLANEOUS) ×1 IMPLANT
TRAP ETRAP POLY (MISCELLANEOUS) ×1 IMPLANT
WATER STERILE IRR 250ML POUR (IV SOLUTION) ×3 IMPLANT

## 2018-06-13 NOTE — Anesthesia Procedure Notes (Signed)
Performed by: Conny Moening, CRNA Pre-anesthesia Checklist: Patient identified, Emergency Drugs available, Suction available, Timeout performed and Patient being monitored Patient Re-evaluated:Patient Re-evaluated prior to induction Oxygen Delivery Method: Nasal cannula Placement Confirmation: positive ETCO2       

## 2018-06-13 NOTE — H&P (Signed)
Vonda Antigua, MD [address], [place], [place], [place], [address] [address], [place], [place], [place], [address] Phone: [phone]  Fax: [phone]  Primary Care Physician:  Juline Patch, MD   Pre-Procedure History & Physical: HPI:  Jamie Kim is a 57 y.o. female is here for a colonoscopy and EGD.   Past Medical History:  Diagnosis Date  . Allergy   . Anxiety   . Back pain, chronic   . DDD (degenerative disc disease), cervical    lumbar  . Depression   . Fibromyalgia   . Fibromyalgia   . GERD (gastroesophageal reflux disease)   . H/O scarlet fever   . Heart murmur    hx  . Hyperlipidemia   . Hypertension   . Migraines   . Motion sickness    if reads in car  . Multiple thyroid nodules   . PONV (postoperative nausea and vomiting)   . Sleep apnea    mild - no CPAP  . Vitamin D deficiency   . Wears dentures    partial upper    Past Surgical History:  Procedure Laterality Date  . ANTERIOR CERVICAL DECOMP/DISCECTOMY FUSION    . BLADDER SUSPENSION    . CARDIAC CATHETERIZATION  11/23/12   [facility];EF 65%  . CARPAL TUNNEL RELEASE Bilateral   . CHOLECYSTECTOMY    . HERNIA REPAIR    . KNEE SURGERY Left   . LAPAROSCOPY    . NECK SURGERY    . NISSEN FUNDOPLICATION    . TONSILLECTOMY    . TOTAL ABDOMINAL HYSTERECTOMY      Prior to Admission medications   Medication Sig Start Date End Date Taking? Authorizing Provider  acetaminophen (TYLENOL) 325 MG tablet Take 650 mg by mouth every 6 (six) hours as needed.   Yes [provider]  buPROPion (WELLBUTRIN XL) 150 MG 24 hr tablet TAKE 1 TABLET BY MOUTH EVERY DAY IN THE MORNING 05/03/18  Yes Juline Patch, MD  busPIRone (BUSPAR) 30 MG tablet Take 1 tablet (30 mg total) by mouth 2 (two) times daily. 05/03/18  Yes Juline Patch, MD  DULoxetine (CYMBALTA) 60 MG capsule Take 1-2 mg by mouth daily. Dr Primus Bravo 10/24/09  Yes [provider]  esomeprazole (NEXIUM) 40 MG capsule Take 1  capsule (40 mg total) by mouth daily. TAKE ONE CAPSULE BY MOUTH EVERY DAY AS DIRECTED BY PROVIDER (pt buys otc to get BID) 05/03/18  Yes Juline Patch, MD  ezetimibe (ZETIA) 10 MG tablet Take 1 tablet (10 mg total) by mouth daily. Reported on 10/15/2015 05/03/18  Yes Juline Patch, MD  gabapentin (NEURONTIN) 600 MG tablet Take 600 mg by mouth 2 (two) times daily. Dr Primus Bravo 10/24/09  Yes [provider]  methadone (DOLOPHINE) 5 MG tablet Limit 2-3 tabs by mouth per day if tolerated Patient taking differently: Take 5 mg by mouth. Limit 2-3 tabs by mouth per day if tolerated- Dr Primus Bravo 02/17/16  Yes Mohammed Kindle, MD  metoprolol succinate (TOPROL-XL) 100 MG 24 hr tablet Take 1 tablet (100 mg total) by mouth daily. Take with or immediately following a meal. 05/03/18  Yes Juline Patch, MD  Multiple Vitamin (MULTI-VITAMINS) TABS Take by mouth.   Yes [provider]  simvastatin (ZOCOR) 80 MG tablet TAKE 1 TABLET BY MOUTH EVERYDAY AT BEDTIME 05/03/18  Yes Juline Patch, MD  tiZANidine (ZANAFLEX) 2 MG tablet Limit  2-4 tablets by mouth per day tolerated Patient taking differently: Take 2  mg by mouth once. Limit  2-4 tablets by mouth per day tolerated/ Dr Primus Bravo 02/17/16  Yes Mohammed Kindle, MD  tolterodine (DETROL LA) 4 MG 24 hr capsule Take 1 capsule (4 mg total) by mouth daily. Melody Burr 11/24/17  Yes Shambley, Melody N, CNM  topiramate (TOPAMAX) 50 MG tablet Take 1 tablet (50 mg total) by mouth 2 (two) times daily. 05/03/18  Yes Juline Patch, MD  Vitamin D, Ergocalciferol, (DRISDOL) 50000 units CAPS capsule TAKE 1 CAPSULE BY MOUTH ONCE A WEEK 02/13/18  Yes Juline Patch, MD    Allergies as of 06/05/2018 - Review Complete 05/31/2018  Allergen Reaction Noted  . Codeine Itching 10/26/2012  . Hydrocodone Itching 10/26/2012  . Morphine Itching 10/15/2014  . Morphine and related  10/26/2012  . Oxybutynin Itching 10/15/2014  . Oxycodone Itching 10/15/2014  .  Tramadol-acetaminophen Itching 10/15/2014    Family History  Problem Relation Age of Onset  . Heart disease Mother   . Heart attack Mother        x2  . Cancer Father   . Melanoma Father   . Colon cancer Father   . Colon cancer Paternal Grandfather   . Breast cancer Neg Hx     Social History   Socioeconomic History  . Marital status: Married    Spouse name: Not on file  . Number of children: Not on file  . Years of education: Not on file  . Highest education level: Not on file  Occupational History  . Not on file  Social Needs  . Financial resource strain: Not on file  . Food insecurity:    Worry: Not on file    Inability: Not on file  . Transportation needs:    Medical: Not on file    Non-medical: Not on file  Tobacco Use  . Smoking status: Former Smoker    Packs/day: 0.50    Years: 25.00    Pack years: 12.50    Types: Cigarettes    Last attempt to quit: 2018    Years since quitting: 1.9  . Smokeless tobacco: Never Used  Substance and Sexual Activity  . Alcohol use: No    Alcohol/week: 0.0 standard drinks    Comment: 1 month  . Drug use: No  . Sexual activity: Not on file  Lifestyle  . Physical activity:    Days per week: Not on file    Minutes per session: Not on file  . Stress: Not on file  Relationships  . Social connections:    Talks on phone: Not on file    Gets together: Not on file    Attends religious service: Not on file    Active member of club or organization: Not on file    Attends meetings of clubs or organizations: Not on file    Relationship status: Not on file  . Intimate partner violence:    Fear of current or ex partner: Not on file    Emotionally abused: Not on file    Physically abused: Not on file    Forced sexual activity: Not on file  Other Topics Concern  . Not on file  Social History Narrative  . Not on file    Review of Systems: See HPI, otherwise negative ROS  Physical Exam: BP (!) 135/96   Pulse 64   Temp 98.1  F (36.7 C) (Temporal)   Resp 15   Ht 5\' 10"  (1.778 m)   Wt 88.9 kg   SpO2  100%   BMI 28.12 kg/m  General:   Alert,  pleasant and cooperative in NAD Head:  Normocephalic and atraumatic. Neck:  Supple; no masses or thyromegaly. Lungs:  Clear throughout to auscultation, normal respiratory effort.    Heart:  +S1, +S2, Regular rate and rhythm, No edema. Abdomen:  Soft, nontender and nondistended. Normal bowel sounds, without guarding, and without rebound.   Neurologic:  Alert and  oriented x4;  grossly normal neurologically.  Impression/Plan: Jamie Kim is here for a colonoscopy to be performed for high risk screening and EGD for Acid Reflux and gastric intestinal metaplasia.  Risks, benefits, limitations, and alternatives regarding the procedures have been reviewed with the patient.  Questions have been answered.  All parties agreeable.   Virgel Manifold, MD  06/13/2018, 8:58 AM

## 2018-06-13 NOTE — Transfer of Care (Signed)
Immediate Anesthesia Transfer of Care Note  Patient: Jamie Kim  Procedure(s) Performed: COLONOSCOPY WITH BIOPSIES (N/A Rectum) ESOPHAGOGASTRODUODENOSCOPY (EGD) WITH BIOPSIES (N/A Throat) POLYPECTOMY (N/A Rectum)  Patient Location: PACU  Anesthesia Type: General  Level of Consciousness: awake, alert  and patient cooperative  Airway and Oxygen Therapy: Patient Spontanous Breathing and Patient connected to supplemental oxygen  Post-op Assessment: Post-op Vital signs reviewed, Patient's Cardiovascular Status Stable, Respiratory Function Stable, Patent Airway and No signs of Nausea or vomiting  Post-op Vital Signs: Reviewed and stable  Complications: No apparent anesthesia complications

## 2018-06-13 NOTE — Op Note (Signed)
[facility] Gastroenterology Patient Name: Jamie Kim Procedure Date: 06/13/2018 8:57 AM MRN: [id number] Account #: [id number] Date of Birth: 1960-08-30 Admit Type: Outpatient Age: 57 Room: [facility] OR ROOM 01 Gender: Female Note Status: Finalized Procedure:            Upper GI endoscopy Indications:          Dysphagia, Heartburn, Follow-up of Helicobacter pylori,                        Follow-up of intestinal metaplasia, Pt reported history                        of dysphagia 2 yrs ago with sodas only and was told it                        was due to esophageal spasm by another provider. No                        dysphagia since then. Providers:            Lennette Bihari. Bonna Gains MD, MD Referring MD:         Juline Patch, MD (Referring MD) Medicines:            Monitored Anesthesia Care Complications:        No immediate complications. Procedure:            Pre-Anesthesia Assessment:                       - Prior to the procedure, a History and Physical was                        performed, and patient medications, allergies and                        sensitivities were reviewed. The patient's tolerance of                        previous anesthesia was reviewed.                       - The risks and benefits of the procedure and the                        sedation options and risks were discussed with the                        patient. All questions were answered and informed                        consent was obtained.                       - Patient identification and proposed procedure were                        verified prior to the procedure by the physician, the                        nurse, the anesthesiologist, the anesthetist and the  technician. The procedure was verified in the procedure                        room.                       - ASA Grade Assessment: II - A patient with mild                        systemic  disease.                       After obtaining informed consent, the endoscope was                        passed under direct vision. Throughout the procedure,                        the patient's blood pressure, pulse, and oxygen                        saturations were monitored continuously. The was                        introduced through the mouth, and advanced to the                        second part of duodenum. The upper GI endoscopy was                        accomplished with ease. The patient tolerated the                        procedure well. Findings:      The examined esophagus was normal. Biopsies were obtained from the       proximal and distal esophagus with cold forceps for histology of       suspected eosinophilic esophagitis.      Patchy mildly erythematous mucosa without bleeding was found in the       gastric antrum. Biopsies were taken with a cold forceps for histology.       Biopsies were obtained in the gastric body, at the incisura and in the       gastric antrum with cold forceps for histology. Biopsies were obtained       on the greater curvature of the gastric body, on the lesser curvature of       the gastric body, at the incisura, on the greater curvature of the       gastric antrum and on the lesser curvature of the gastric antrum with       cold forceps for histology.      A single 4 mm sessile polyp with no bleeding and no stigmata of recent       bleeding was found in the gastric fundus. Biopsies were taken with a       cold forceps for histology.      Evidence of a Nissen fundoplication was found in the gastric fundus. The       wrap appeared intact.      The duodenal bulb, second portion of the duodenum and examined duodenum       were normal. Impression:           -  Normal esophagus. Biopsied.                       - Erythematous mucosa in the antrum. Biopsied.                       - A single gastric polyp. Biopsied.                       - A  Nissen fundoplication was found. The wrap appears                        intact.                       - Normal duodenal bulb, second portion of the duodenum                        and examined duodenum.                       - Biopsies were obtained in the gastric body, at the                        incisura and in the gastric antrum.                       - Biopsies were obtained on the greater curvature of                        the gastric body, on the lesser curvature of the                        gastric body, at the incisura, on the greater curvature                        of the gastric antrum and on the lesser curvature of                        the gastric antrum. Recommendation:       - Await pathology results.                       - Discharge patient to home (with escort).                       - Advance diet as tolerated.                       - Continue present medications.                       - Patient has a contact number available for                        emergencies. The signs and symptoms of potential                        delayed complications were discussed with the patient.                        Return to normal activities tomorrow. Written  discharge                        instructions were provided to the patient.                       - Discharge patient to home (with escort).                       - The findings and recommendations were discussed with                        the patient.                       - The findings and recommendations were discussed with                        the patient's family. Procedure Code(s):    --- Professional ---                       [phone], Esophagogastroduodenoscopy, flexible, transoral;                        with biopsy, single or multiple Diagnosis Code(s):    --- Professional ---                       K31.89, Other diseases of stomach and duodenum                       K31.7, Polyp of stomach and duodenum                        Z98.890, Other specified postprocedural states                       R13.10, Dysphagia, unspecified                       R12, Heartburn                       T02.40, Helicobacter pylori [H. pylori] as the cause of                        diseases classified elsewhere CPT copyright 2018 American Medical Association. All rights reserved. The codes documented in this report are preliminary and upon coder review may  be revised to meet current compliance requirements.  Vonda Antigua, MD Margretta Sidle B. Bonna Gains MD, MD 06/13/2018 9:31:35 AM This report has been signed electronically. Number of Addenda: 0 Note Initiated On: 06/13/2018 8:57 AM Estimated Blood Loss: Estimated blood loss: none.      [facility]

## 2018-06-13 NOTE — Anesthesia Postprocedure Evaluation (Addendum)
Anesthesia Post Note  Patient: Jamie Kim  Procedure(s) Performed: COLONOSCOPY WITH BIOPSIES (N/A Rectum) ESOPHAGOGASTRODUODENOSCOPY (EGD) WITH BIOPSIES (N/A Throat) POLYPECTOMY (N/A Rectum)  Patient location during evaluation: PACU Anesthesia Type: General Level of consciousness: awake and alert and oriented Pain management: satisfactory to patient Vital Signs Assessment: post-procedure vital signs reviewed and stable Respiratory status: spontaneous breathing, nonlabored ventilation and respiratory function stable Cardiovascular status: blood pressure returned to baseline and stable Postop Assessment: Adequate PO intake and No signs of nausea or vomiting Anesthetic complications: no    Raliegh Ip

## 2018-06-13 NOTE — Op Note (Signed)
[facility] Gastroenterology Patient Name: Jamie Kim Procedure Date: 06/13/2018 8:56 AM MRN: [id number] Account #: [id number] Date of Birth: 30-Oct-1960 Admit Type: Outpatient Age: 57 Room: [facility] OR ROOM 01 Gender: Female Note Status: Finalized Procedure:            Colonoscopy Indications:          Screening in patient at increased risk: Family history                        of 1st-degree relative with colorectal cancer Providers:            Quenton Recendez B. Bonna Gains MD, MD Medicines:            Monitored Anesthesia Care Complications:        No immediate complications. Procedure:            Pre-Anesthesia Assessment:                       - ASA Grade Assessment: II - A patient with mild                        systemic disease.                       - Prior to the procedure, a History and Physical was                        performed, and patient medications, allergies and                        sensitivities were reviewed. The patient's tolerance of                        previous anesthesia was reviewed.                       - The risks and benefits of the procedure and the                        sedation options and risks were discussed with the                        patient. All questions were answered and informed                        consent was obtained.                       - Patient identification and proposed procedure were                        verified prior to the procedure by the physician, the                        nurse, the anesthesiologist, the anesthetist and the                        technician. The procedure was verified in the procedure                        room.  After obtaining informed consent, the colonoscope was                        passed under direct vision. Throughout the procedure,                        the patient's blood pressure, pulse, and oxygen                        saturations were  monitored continuously. The                        Colonoscope was introduced through the anus and                        advanced to the the terminal ileum. The colonoscopy was                        performed with ease. The patient tolerated the                        procedure well. The quality of the bowel preparation                        was good. Findings:      The perianal and digital rectal examinations were normal.      Two sessile polyps were found in the ascending colon. The polyps were 4       to 5 mm in size. These polyps were removed with a cold snare. Resection       and retrieval were complete.      A 4 mm polyp was found in the transverse colon. The polyp was sessile.       The polyp was removed with a cold biopsy forceps. Resection and       retrieval were complete.      A 4 mm polyp was found in the sigmoid colon. The polyp was sessile. The       polyp was removed with a cold biopsy forceps. Resection and retrieval       were complete.      The exam was otherwise without abnormality.      The rectum, sigmoid colon, descending colon, transverse colon, ascending       colon, cecum and ileum appeared normal.      The retroflexed view of the distal rectum and anal verge was normal and       showed no anal or rectal abnormalities. Impression:           - Two 4 to 5 mm polyps in the ascending colon, removed                        with a cold snare. Resected and retrieved.                       - One 4 mm polyp in the transverse colon, removed with                        a cold biopsy forceps. Resected and retrieved.                       -  One 4 mm polyp in the sigmoid colon, removed with a                        cold biopsy forceps. Resected and retrieved.                       - The examination was otherwise normal.                       - The rectum, sigmoid colon, descending colon,                        transverse colon, ascending colon and cecum are normal.                        - The distal rectum and anal verge are normal on                        retroflexion view. Recommendation:       - Discharge patient to home (with escort).                       - Advance diet as tolerated.                       - Continue present medications.                       - Await pathology results.                       - Repeat colonoscopy date to be determined after                        pending pathology results are reviewed.                       - The findings and recommendations were discussed with                        the patient.                       - The findings and recommendations were discussed with                        the patient's family.                       - Return to primary care physician as previously                        scheduled. Procedure Code(s):    --- Professional ---                       [phone], Colonoscopy, flexible; with removal of tumor(s),                        polyp(s), or other lesion(s) by snare technique                       60630, 59, Colonoscopy, flexible; with biopsy, single  or multiple Diagnosis Code(s):    --- Professional ---                       Z80.0, Family history of malignant neoplasm of                        digestive organs                       D12.2, Benign neoplasm of ascending colon                       D12.3, Benign neoplasm of transverse colon (hepatic                        flexure or splenic flexure)                       D12.5, Benign neoplasm of sigmoid colon CPT copyright 2018 American Medical Association. All rights reserved. The codes documented in this report are preliminary and upon coder review may  be revised to meet current compliance requirements.  Vonda Antigua, MD Margretta Sidle B. Bonna Gains MD, MD 06/13/2018 10:09:43 AM This report has been signed electronically. Number of Addenda: 0 Note Initiated On: 06/13/2018 8:56 AM Scope Withdrawal Time: 0 hours  20 minutes 54 seconds  Total Procedure Duration: 0 hours 31 minutes 10 seconds  Estimated Blood Loss: Estimated blood loss: none. Estimated blood loss: none.      [facility]

## 2018-06-14 ENCOUNTER — Encounter: Payer: Self-pay | Admitting: Gastroenterology

## 2018-06-15 DIAGNOSIS — M1990 Unspecified osteoarthritis, unspecified site: Secondary | ICD-10-CM | POA: Diagnosis not present

## 2018-06-15 DIAGNOSIS — M4310 Spondylolisthesis, site unspecified: Secondary | ICD-10-CM | POA: Diagnosis not present

## 2018-06-15 DIAGNOSIS — M4322 Fusion of spine, cervical region: Secondary | ICD-10-CM | POA: Diagnosis not present

## 2018-06-15 DIAGNOSIS — Z5181 Encounter for therapeutic drug level monitoring: Secondary | ICD-10-CM | POA: Diagnosis not present

## 2018-06-20 ENCOUNTER — Encounter: Payer: Self-pay | Admitting: Gastroenterology

## 2018-06-27 ENCOUNTER — Telehealth: Payer: Self-pay

## 2018-06-27 NOTE — Telephone Encounter (Signed)
-----   Message from Virgel Manifold, MD sent at 06/21/2018  9:24 AM EST ----- Jamie Kim please let patient know, her colon polyps were benign but precancerous.  These have been removed.  Her next colonoscopy should be in 3 years.  Please set recall. Her stomach biopsies did not show H. pylori or changes that were seen last time called metaplasia.  Given her history of metaplasia on previous biopsies, I would recommend possible repeat EGD in 3 years with her colonoscopy to ensure no further metaplasia is present on repeat biopsies as well.  This can be discussed in clinic in 3 years as well.  Please set recall.

## 2018-06-27 NOTE — Telephone Encounter (Signed)
Left message for pt to contact for EGD and colonoscopy results. Due to it being a holiday, I left message that results were nothing bad so she wouldn't worry over the holiday.

## 2018-07-10 NOTE — Telephone Encounter (Signed)
Pt is calling for test results please call  cb [phone]

## 2018-07-12 NOTE — Telephone Encounter (Signed)
Left message to contact office at number [phone].

## 2018-07-12 NOTE — Telephone Encounter (Signed)
Patient has been informed her colon polyps were  benign but precancerous.  They have been removed.  Her stomach biopsies did not show H. pylori or changes that were seen last time called metaplasia.  Given her history of metaplasia on previous biopsies, I would recommend possible repeat EGD in 3 years with her colonoscopy to ensure no further metaplasia is present on repeat biopsies as well.   Pt has been informed that she should have repeat EGD/Colonoscopy in 3 years.  She has agreed to do so.  Recall created.  Thanks [employer]

## 2018-08-09 ENCOUNTER — Other Ambulatory Visit: Payer: Self-pay | Admitting: Family Medicine

## 2018-08-09 DIAGNOSIS — E782 Mixed hyperlipidemia: Secondary | ICD-10-CM

## 2018-08-17 ENCOUNTER — Other Ambulatory Visit: Payer: Self-pay | Admitting: Family Medicine

## 2018-08-17 DIAGNOSIS — E559 Vitamin D deficiency, unspecified: Secondary | ICD-10-CM

## 2018-11-02 ENCOUNTER — Other Ambulatory Visit: Payer: Self-pay

## 2018-11-02 ENCOUNTER — Ambulatory Visit (INDEPENDENT_AMBULATORY_CARE_PROVIDER_SITE_OTHER): Payer: Medicare Other | Admitting: Family Medicine

## 2018-11-02 ENCOUNTER — Encounter: Payer: Self-pay | Admitting: Family Medicine

## 2018-11-02 VITALS — BP 130/80 | HR 80 | Ht 70.0 in | Wt 207.0 lb

## 2018-11-02 DIAGNOSIS — I10 Essential (primary) hypertension: Secondary | ICD-10-CM | POA: Diagnosis not present

## 2018-11-02 DIAGNOSIS — F419 Anxiety disorder, unspecified: Secondary | ICD-10-CM | POA: Diagnosis not present

## 2018-11-02 DIAGNOSIS — R5383 Other fatigue: Secondary | ICD-10-CM

## 2018-11-02 DIAGNOSIS — R69 Illness, unspecified: Secondary | ICD-10-CM

## 2018-11-02 DIAGNOSIS — N3941 Urge incontinence: Secondary | ICD-10-CM

## 2018-11-02 DIAGNOSIS — E782 Mixed hyperlipidemia: Secondary | ICD-10-CM

## 2018-11-02 DIAGNOSIS — G43C0 Periodic headache syndromes in child or adult, not intractable: Secondary | ICD-10-CM

## 2018-11-02 DIAGNOSIS — K219 Gastro-esophageal reflux disease without esophagitis: Secondary | ICD-10-CM | POA: Diagnosis not present

## 2018-11-02 DIAGNOSIS — F339 Major depressive disorder, recurrent, unspecified: Secondary | ICD-10-CM | POA: Diagnosis not present

## 2018-11-02 MED ORDER — OXYBUTYNIN CHLORIDE ER 5 MG PO TB24: 5.0000 mg | ORAL_TABLET | Freq: Every day | ORAL | 1 refills | Status: DC

## 2018-11-02 MED ORDER — TOPIRAMATE 50 MG PO TABS: 50.0000 mg | ORAL_TABLET | Freq: Two times a day (BID) | ORAL | 1 refills | Status: DC

## 2018-11-02 MED ORDER — SIMVASTATIN 80 MG PO TABS: ORAL_TABLET | ORAL | 1 refills | Status: DC

## 2018-11-02 MED ORDER — ESOMEPRAZOLE MAGNESIUM 40 MG PO CPDR: 40.0000 mg | DELAYED_RELEASE_CAPSULE | Freq: Every day | ORAL | 1 refills | Status: DC

## 2018-11-02 MED ORDER — BUPROPION HCL ER (XL) 150 MG PO TB24: ORAL_TABLET | ORAL | 1 refills | Status: DC

## 2018-11-02 MED ORDER — METOPROLOL SUCCINATE ER 100 MG PO TB24: 100.0000 mg | ORAL_TABLET | Freq: Every day | ORAL | 1 refills | Status: DC

## 2018-11-02 MED ORDER — BUSPIRONE HCL 30 MG PO TABS: 30.0000 mg | ORAL_TABLET | Freq: Two times a day (BID) | ORAL | 1 refills | Status: DC

## 2018-11-02 MED ORDER — EZETIMIBE 10 MG PO TABS: 10.0000 mg | ORAL_TABLET | Freq: Every day | ORAL | 1 refills | Status: DC

## 2018-11-02 NOTE — Patient Instructions (Signed)
This information is directly available on the CDC website: https://www.cdc.gov/coronavirus/2019-ncov/if-you-are-sick/steps-when-sick.html    Source:CDC Reference to specific commercial products, manufacturers, companies, or trademarks does not constitute its endorsement or recommendation by the U.S. Government, Department of Health and Human Services, or Centers for Disease Control and Prevention.  

## 2018-11-02 NOTE — Progress Notes (Signed)
Date:  11/02/2018   Name:  Jamie Kim   DOB:  [birth year]   MRN:  [id number]   Chief Complaint: Depression (PHQ9=0); overactive bladder (change detrol to oxybutinin); Hypertension; Hyperlipidemia; and migraines  Depression         The patient presents with no depression.  This is a chronic problem.  The current episode started more than 1 year ago.   The onset quality is sudden.   The problem occurs every several days.  The problem has been gradually improving since onset.  Associated symptoms include fatigue.  Associated symptoms include no decreased concentration, no helplessness, no hopelessness, does not have insomnia, not irritable, no restlessness, no decreased interest, no appetite change, no body aches, no myalgias, no headaches, no indigestion, not sad and no suicidal ideas.     The symptoms are aggravated by nothing.  Past treatments include other medications (welbutrin).  Compliance with treatment is variable.  Previous treatment provided mild relief.   Pertinent negatives include no chronic fatigue syndrome, no chronic pain, no fibromyalgia, no hypothyroidism, no thyroid problem, no chronic illness, no recent illness, no life-threatening condition, no physical disability, no terminal illness, no recent psychiatric admission, no Alzheimer's disease, no brain trauma, no dementia, no anxiety, no bipolar disorder, no eating disorder, no depression, no mental health disorder, no obsessive-compulsive disorder, no post-traumatic stress disorder, no schizophrenia and no suicide attempts. Hypertension  This is a chronic problem. The current episode started more than 1 year ago. The problem has been waxing and waning since onset. The problem is controlled. Pertinent negatives include no anxiety, blurred vision, chest pain, headaches, malaise/fatigue, neck pain, orthopnea, palpitations, peripheral edema, PND, shortness of breath or sweats. There are no associated agents to hypertension. Past  treatments include beta blockers. The current treatment provides moderate improvement. There are no compliance problems.  There is no history of angina, kidney disease, CAD/MI, CVA, heart failure, left ventricular hypertrophy, PVD or retinopathy. There is no history of chronic renal disease, a hypertension causing med, renovascular disease or a thyroid problem.  Hyperlipidemia  This is a chronic problem. The problem is controlled. Recent lipid tests were reviewed and are normal. She has no history of chronic renal disease, diabetes, hypothyroidism or liver disease. There are no known factors aggravating her hyperlipidemia. Pertinent negatives include no chest pain, myalgias or shortness of breath. Current antihyperlipidemic treatment includes ezetimibe and statins. The current treatment provides moderate improvement of lipids.  Thyroid Problem  Presents for follow-up visit. Symptoms include depressed mood, diaphoresis, fatigue, hoarse voice, nail problem and weight gain. Patient reports no anxiety, cold intolerance, constipation, diarrhea, dry skin, hair loss, heat intolerance, leg swelling, menstrual problem, palpitations, tremors, visual change or weight loss. The symptoms have been stable. Her past medical history is significant for hyperlipidemia. There is no history of diabetes or heart failure.  Headache   This is a recurrent problem. The current episode started more than 1 year ago. The problem has been waxing and waning. Pain location: generalized. The quality of the pain is described as aching. Pertinent negatives include no abdominal pain, anorexia, back pain, blurred vision, coughing, dizziness, ear pain, eye pain, eye redness, fever, insomnia, loss of balance, nausea, neck pain, numbness, photophobia, rhinorrhea, sinus pressure, sore throat, visual change, vomiting, weakness or weight loss. Her past medical history is significant for hypertension.  Urinary Frequency   Chronicity: for urge  incontinence. The current episode started in the past 7 days. The problem has been waxing and  waning. The pain is moderate. Associated symptoms include frequency and urgency. Pertinent negatives include no chills, discharge, flank pain, hematuria, nausea, sweats or vomiting.    Review of Systems  Constitutional: Positive for diaphoresis, fatigue and weight gain. Negative for appetite change, chills, fever, malaise/fatigue, unexpected weight change and weight loss.  HENT: Positive for hoarse voice. Negative for congestion, ear discharge, ear pain, rhinorrhea, sinus pressure, sneezing and sore throat.   Eyes: Negative for blurred vision, photophobia, pain, discharge, redness and itching.  Respiratory: Negative for cough, shortness of breath, wheezing and stridor.   Cardiovascular: Negative for chest pain, palpitations, orthopnea and PND.  Gastrointestinal: Negative for abdominal pain, anorexia, blood in stool, constipation, diarrhea, nausea and vomiting.  Endocrine: Negative for cold intolerance, heat intolerance, polydipsia, polyphagia and polyuria.  Genitourinary: Positive for frequency and urgency. Negative for dysuria, flank pain, hematuria, menstrual problem, pelvic pain, vaginal bleeding and vaginal discharge.  Musculoskeletal: Negative for arthralgias, back pain, myalgias and neck pain.  Skin: Negative for rash.  Allergic/Immunologic: Negative for environmental allergies and food allergies.  Neurological: Negative for dizziness, tremors, weakness, light-headedness, numbness, headaches and loss of balance.  Hematological: Negative for adenopathy. Does not bruise/bleed easily.  Psychiatric/Behavioral: Positive for depression. Negative for decreased concentration, dysphoric mood and suicidal ideas. The patient is not nervous/anxious and does not have insomnia.     Patient Active Problem List   Diagnosis Date Noted  . Family history of malignant neoplasm of gastrointestinal tract   . Benign  neoplasm of ascending colon   . Benign neoplasm of transverse colon   . Polyp of sigmoid colon   . Gastric polyp   . Status post Nissen fundoplication (without gastrostomy tube) procedure   . Esophageal dysphagia   . Heartburn   . Essential hypertension 09/03/2016  . Periodic headache syndrome, not intractable 09/03/2016  . Gastroesophageal reflux disease 09/03/2016  . Multiple thyroid nodules 09/03/2016  . Vitamin D deficiency 09/03/2016  . Chronic anxiety 09/03/2016  . Depression, recurrent ([place]) 09/03/2016  . DDD (degenerative disc disease), lumbar 02/26/2015  . Bilateral occipital neuralgia 01/27/2015  . Spinal stenosis of lumbar region 11/25/2014  . DDD (degenerative disc disease), cervical 11/24/2014  . Status post cervical spinal fusion 11/24/2014  . Lumbosacral facet joint syndrome 11/24/2014  . Sacroiliac joint dysfunction of both sides 11/24/2014  . Greater trochanteric bursitis of both hips 11/24/2014  . Cervical facet syndrome 11/24/2014  . Hyperlipidemia   . Chest pain 10/26/2012  . Palpitations 10/26/2012    Allergies  Allergen Reactions  . Codeine Itching  . Hydrocodone Itching  . Morphine Itching  . Morphine And Related   . Oxybutynin Itching  . Oxycodone Itching  . Tramadol-Acetaminophen Itching    Past Surgical History:  Procedure Laterality Date  . ANTERIOR CERVICAL DECOMP/DISCECTOMY FUSION    . BLADDER SUSPENSION    . CARDIAC CATHETERIZATION  11/23/12   [facility];EF 65%  . CARPAL TUNNEL RELEASE Bilateral   . CHOLECYSTECTOMY    . COLONOSCOPY WITH PROPOFOL N/A 06/13/2018   Procedure: COLONOSCOPY WITH BIOPSIES;  Surgeon: Virgel Manifold, MD;  Location: [facility] [facility];  Service: Endoscopy;  Laterality: N/A;  . ESOPHAGOGASTRODUODENOSCOPY (EGD) WITH PROPOFOL N/A 06/13/2018   Procedure: ESOPHAGOGASTRODUODENOSCOPY (EGD) WITH BIOPSIES;  Surgeon: Virgel Manifold, MD;  Location: [facility];  Service: Endoscopy;  Laterality: N/A;  . HERNIA  REPAIR    . KNEE SURGERY Left   . LAPAROSCOPY    . NECK SURGERY    . NISSEN FUNDOPLICATION    .  POLYPECTOMY N/A 06/13/2018   Procedure: POLYPECTOMY;  Surgeon: Virgel Manifold, MD;  Location: [facility];  Service: Endoscopy;  Laterality: N/A;  . TONSILLECTOMY    . TOTAL ABDOMINAL HYSTERECTOMY      Social History   Tobacco Use  . Smoking status: Former Smoker    Packs/day: 0.50    Years: 25.00    Pack years: 12.50    Types: Cigarettes    Last attempt to quit: 2018    Years since quitting: 2.3  . Smokeless tobacco: Never Used  Substance Use Topics  . Alcohol use: No    Alcohol/week: 0.0 standard drinks    Comment: 1 month  . Drug use: No     Medication list has been reviewed and updated.  Current Meds  Medication Sig  . acetaminophen (TYLENOL) 325 MG tablet Take 650 mg by mouth every 6 (six) hours as needed.  Marland Kitchen buPROPion (WELLBUTRIN XL) 150 MG 24 hr tablet TAKE 1 TABLET BY MOUTH EVERY DAY IN THE MORNING  . busPIRone (BUSPAR) 30 MG tablet Take 1 tablet (30 mg total) by mouth 2 (two) times daily.  . DULoxetine (CYMBALTA) 60 MG capsule Take 1-2 mg by mouth daily. Dr Primus Bravo  . esomeprazole (NEXIUM) 40 MG capsule Take 1 capsule (40 mg total) by mouth daily. TAKE ONE CAPSULE BY MOUTH EVERY DAY AS DIRECTED BY PROVIDER (pt buys otc to get BID)  . ezetimibe (ZETIA) 10 MG tablet Take 1 tablet (10 mg total) by mouth daily. Reported on 10/15/2015  . gabapentin (NEURONTIN) 600 MG tablet Take 600 mg by mouth 2 (two) times daily. Dr Primus Bravo  . methadone (DOLOPHINE) 5 MG tablet Limit 2-3 tabs by mouth per day if tolerated (Patient taking differently: Take 5 mg by mouth. Limit 2-3 tabs by mouth per day if tolerated- Dr Primus Bravo)  . metoprolol succinate (TOPROL-XL) 100 MG 24 hr tablet Take 1 tablet (100 mg total) by mouth daily. Take with or immediately following a meal.  . Multiple Vitamin (MULTI-VITAMINS) TABS Take by mouth.  . simvastatin (ZOCOR) 80 MG tablet TAKE 1 TABLET BY MOUTH  EVERYDAY AT BEDTIME  . tiZANidine (ZANAFLEX) 2 MG tablet Limit  2-4 tablets by mouth per day tolerated (Patient taking differently: Take 2 mg by mouth once. Limit  2-4 tablets by mouth per day tolerated/ Dr Primus Bravo)  . tolterodine (DETROL LA) 4 MG 24 hr capsule Take 1 capsule (4 mg total) by mouth daily. [employer]  . topiramate (TOPAMAX) 50 MG tablet Take 1 tablet (50 mg total) by mouth 2 (two) times daily.  . Vitamin D, Ergocalciferol, (DRISDOL) 1.25 MG (50000 UT) CAPS capsule TAKE 1 CAPSULE BY MOUTH ONCE A WEEK   Current Facility-Administered Medications for the 11/02/18 encounter (Office Visit) with Juline Patch, MD  Medication  . bupivacaine (MARCAINE) 0.5 % injection 30 mL  . bupivacaine (PF) (MARCAINE) 0.25 % injection 30 mL  . bupivacaine (PF) (MARCAINE) 0.25 % injection 30 mL  . fentaNYL (SUBLIMAZE) injection 100 mcg  . fentaNYL (SUBLIMAZE) injection 100 mcg  . fentaNYL (SUBLIMAZE) injection 100 mcg  . lactated ringers infusion 1,000 mL  . lactated ringers infusion 1,000 mL  . lactated ringers infusion 1,000 mL  . lactated ringers infusion 1,000 mL  . lidocaine (PF) (XYLOCAINE) 1 % injection 10 mL  . midazolam (VERSED) 5 MG/5ML injection 5 mg  . midazolam (VERSED) 5 MG/5ML injection 5 mg  . midazolam (VERSED) 5 MG/5ML injection 5 mg  . orphenadrine (NORFLEX) injection 60  mg  . orphenadrine (NORFLEX) injection 60 mg  . orphenadrine (NORFLEX) injection 60 mg  . sodium chloride 0.9 % injection 20 mL  . triamcinolone acetonide (KENALOG-40) injection 40 mg  . triamcinolone acetonide (KENALOG-40) injection 40 mg  . triamcinolone acetonide (KENALOG-40) injection 40 mg    PHQ 2/9 Scores 11/02/2018 05/03/2018 02/17/2016 01/20/2016  PHQ - 2 Score 0 0 0 0  PHQ- 9 Score 0 0 - -  Exception Documentation - - - -    BP Readings from Last 3 Encounters:  11/02/18 130/80  06/13/18 (!) 123/100  05/31/18 137/82    Physical Exam Vitals signs and nursing note reviewed.  Constitutional:       General: She is not irritable.She is not in acute distress.    Appearance: She is not diaphoretic.  HENT:     Head: Normocephalic and atraumatic.     Right Ear: Hearing, tympanic membrane, ear canal and external ear normal.     Left Ear: Hearing, tympanic membrane, ear canal and external ear normal.     Nose: Nose normal.     Mouth/Throat:     Lips: Pink.     Mouth: Mucous membranes are moist.     Tongue: No lesions. Tongue does not deviate from midline.     Palate: No mass and lesions.     Pharynx: Oropharynx is clear. Uvula midline.  Eyes:     General:        Right eye: No discharge.        Left eye: No discharge.     Conjunctiva/sclera: Conjunctivae normal.     Pupils: Pupils are equal, round, and reactive to light.  Neck:     Musculoskeletal: Normal range of motion and neck supple.     Thyroid: No thyromegaly.     Vascular: No JVD.  Cardiovascular:     Rate and Rhythm: Normal rate and regular rhythm.  No extrasystoles are present.    Chest Wall: PMI is not displaced.     Pulses: Normal pulses.          Carotid pulses are 2+ on the right side and 2+ on the left side.      Radial pulses are 2+ on the right side and 2+ on the left side.       Femoral pulses are 2+ on the right side and 2+ on the left side.      Popliteal pulses are 2+ on the right side and 2+ on the left side.       Dorsalis pedis pulses are 2+ on the right side and 2+ on the left side.       Posterior tibial pulses are 2+ on the right side and 2+ on the left side.     Heart sounds: Normal heart sounds, S1 normal and S2 normal. No murmur. No systolic murmur. No diastolic murmur. No friction rub. No gallop. No S3 or S4 sounds.   Pulmonary:     Effort: Pulmonary effort is normal.     Breath sounds: Normal breath sounds.  Abdominal:     General: Bowel sounds are normal.     Palpations: Abdomen is soft. There is no mass.     Tenderness: There is no abdominal tenderness. There is no guarding.   Musculoskeletal: Normal range of motion.     Right lower leg: No edema.     Left lower leg: No edema.  Lymphadenopathy:     Cervical: No cervical adenopathy.  Skin:  General: Skin is warm and dry.  Neurological:     Mental Status: She is alert.     Deep Tendon Reflexes: Reflexes are normal and symmetric.     Wt Readings from Last 3 Encounters:  11/02/18 207 lb (93.9 kg)  06/13/18 196 lb (88.9 kg)  05/31/18 203 lb 12.8 oz (92.4 kg)    BP 130/80   Pulse 80   Ht 5\' 10"  (1.778 m)   Wt 207 lb (93.9 kg)   BMI 29.70 kg/m   Assessment and Plan: 1. Depression, recurrent (HCC) Chronic.  Controlled.  Continue bupropion 150 mg once a day. - buPROPion (WELLBUTRIN XL) 150 MG 24 hr tablet; TAKE 1 TABLET BY MOUTH EVERY DAY IN THE MORNING  Dispense: 90 tablet; Refill: 1  2. Chronic anxiety Panic.  Controlled.  Continue BuSpar 30 mg 1 twice a day. - busPIRone (BUSPAR) 30 MG tablet; Take 1 tablet (30 mg total) by mouth 2 (two) times daily.  Dispense: 180 tablet; Refill: 1  3. Gastroesophageal reflux disease, esophagitis presence not specified Controlled.  Chronic.  Continue Nexium 40 mg once a day. - esomeprazole (NEXIUM) 40 MG capsule; Take 1 capsule (40 mg total) by mouth daily. TAKE ONE CAPSULE BY MOUTH EVERY DAY AS DIRECTED BY PROVIDER (pt buys otc to get BID)  Dispense: 90 capsule; Refill: 1  4. Mixed hyperlipidemia Controlled.  Chronic.  Continue combination acetamide 10 mg and Zocor 80 mg once a day.  Patient has been on this regimen for several years and this is been the best control. - ezetimibe (ZETIA) 10 MG tablet; Take 1 tablet (10 mg total) by mouth daily. Reported on 10/15/2015  Dispense: 90 tablet; Refill: 1 - simvastatin (ZOCOR) 80 MG tablet; One tablet daily  Dispense: 90 tablet; Refill: 1 - Lipid panel  5. Essential hypertension Chronic.  Controlled.  Continue metoprolol XL 100 mg once a day.  Will check renal function panel - metoprolol succinate (TOPROL-XL) 100 MG 24  hr tablet; Take 1 tablet (100 mg total) by mouth daily. Take with or immediately following a meal.  Dispense: 90 tablet; Refill: 1 - Renal function panel  6. Periodic headache syndrome, not intractable It is unclear whether patient has a an atypical migraine but she has been prescribed Topamax and this seems to be controlling her headache syndrome therefore we will continue Topamax at current dosing of 10 mg twice a day. - topiramate (TOPAMAX) 50 MG tablet; Take 1 tablet (50 mg total) by mouth 2 (two) times daily.  Dispense: 180 tablet; Refill: 1  7. Urge incontinence And has a history of urge incontinence will continue Ditropan XL 5 mg and suggested by insurance company. - oxybutynin (DITROPAN-XL) 5 MG 24 hr tablet; Take 1 tablet (5 mg total) by mouth at bedtime.  Dispense: 90 tablet; Refill: 1  8. Taking medication for chronic disease Patient is on 80 mg of Zocor and will check hepatic function panel to rule out hepatotoxicity. - Hepatic Function Panel (6)  9. Fatigue, unspecified type Patient has had increasing fatigue with inability to lose weight will check a TSH to rule out hypothyroidism. - TSH

## 2018-11-03 LAB — RENAL FUNCTION PANEL
Albumin: 4.4 g/dL (ref 3.8–4.9)
BUN/Creatinine Ratio: 16 (ref 9–23)
BUN: 10 mg/dL (ref 6–24)
CO2: 23 mmol/L (ref 20–29)
Calcium: 9.5 mg/dL (ref 8.7–10.2)
Chloride: 106 mmol/L (ref 96–106)
Creatinine, Ser: 0.61 mg/dL (ref 0.57–1.00)
GFR calc Af Amer: 116 mL/min/{1.73_m2} (ref >59)
GFR calc non Af Amer: 101 mL/min/{1.73_m2} (ref >59)
Glucose: 121 mg/dL — ABNORMAL HIGH (ref 65–99)
Phosphorus: 5 mg/dL — ABNORMAL HIGH (ref 3.0–4.3)
Potassium: 4.2 mmol/L (ref 3.5–5.2)
Sodium: 145 mmol/L — ABNORMAL HIGH (ref 134–144)

## 2018-11-03 LAB — HEPATIC FUNCTION PANEL (6)
ALT: 13 IU/L (ref 0–32)
AST: 17 IU/L (ref 0–40)
Alkaline Phosphatase: 68 IU/L (ref 39–117)
Bilirubin Total: <0.2 mg/dL (ref 0.0–1.2)
Bilirubin, Direct: 0.05 mg/dL (ref 0.00–0.40)

## 2018-11-03 LAB — LIPID PANEL
Chol/HDL Ratio: 2.5 ratio (ref 0.0–4.4)
Cholesterol, Total: 152 mg/dL (ref 100–199)
HDL: 62 mg/dL (ref >39)
LDL Calculated: 59 mg/dL (ref 0–99)
Triglycerides: 157 mg/dL — ABNORMAL HIGH (ref 0–149)
VLDL Cholesterol Cal: 31 mg/dL (ref 5–40)

## 2018-11-03 LAB — TSH: TSH: 1.97 u[IU]/mL (ref 0.450–4.500)

## 2018-12-04 ENCOUNTER — Other Ambulatory Visit: Payer: Self-pay | Admitting: Family Medicine

## 2018-12-04 DIAGNOSIS — E559 Vitamin D deficiency, unspecified: Secondary | ICD-10-CM

## 2019-01-15 DIAGNOSIS — H04123 Dry eye syndrome of bilateral lacrimal glands: Secondary | ICD-10-CM | POA: Diagnosis not present

## 2019-01-15 DIAGNOSIS — H524 Presbyopia: Secondary | ICD-10-CM | POA: Diagnosis not present

## 2019-01-15 DIAGNOSIS — H5213 Myopia, bilateral: Secondary | ICD-10-CM | POA: Diagnosis not present

## 2019-01-15 DIAGNOSIS — H16223 Keratoconjunctivitis sicca, not specified as Sjogren's, bilateral: Secondary | ICD-10-CM | POA: Diagnosis not present

## 2019-01-15 DIAGNOSIS — H52213 Irregular astigmatism, bilateral: Secondary | ICD-10-CM | POA: Diagnosis not present

## 2019-01-29 ENCOUNTER — Other Ambulatory Visit: Payer: Self-pay | Admitting: Obstetrics and Gynecology

## 2019-01-29 ENCOUNTER — Other Ambulatory Visit: Payer: Self-pay | Admitting: Family Medicine

## 2019-01-29 DIAGNOSIS — E559 Vitamin D deficiency, unspecified: Secondary | ICD-10-CM

## 2019-01-30 DIAGNOSIS — M1990 Unspecified osteoarthritis, unspecified site: Secondary | ICD-10-CM | POA: Diagnosis not present

## 2019-01-30 DIAGNOSIS — M4322 Fusion of spine, cervical region: Secondary | ICD-10-CM | POA: Diagnosis not present

## 2019-01-30 DIAGNOSIS — M4310 Spondylolisthesis, site unspecified: Secondary | ICD-10-CM | POA: Diagnosis not present

## 2019-01-30 DIAGNOSIS — Z5181 Encounter for therapeutic drug level monitoring: Secondary | ICD-10-CM | POA: Diagnosis not present

## 2019-03-07 DIAGNOSIS — M4322 Fusion of spine, cervical region: Secondary | ICD-10-CM | POA: Diagnosis not present

## 2019-03-07 DIAGNOSIS — M1990 Unspecified osteoarthritis, unspecified site: Secondary | ICD-10-CM | POA: Diagnosis not present

## 2019-03-07 DIAGNOSIS — Z5181 Encounter for therapeutic drug level monitoring: Secondary | ICD-10-CM | POA: Diagnosis not present

## 2019-03-07 DIAGNOSIS — M4310 Spondylolisthesis, site unspecified: Secondary | ICD-10-CM | POA: Diagnosis not present

## 2019-04-09 DIAGNOSIS — M1990 Unspecified osteoarthritis, unspecified site: Secondary | ICD-10-CM | POA: Diagnosis not present

## 2019-04-09 DIAGNOSIS — M48062 Spinal stenosis, lumbar region with neurogenic claudication: Secondary | ICD-10-CM | POA: Diagnosis not present

## 2019-04-09 DIAGNOSIS — M792 Neuralgia and neuritis, unspecified: Secondary | ICD-10-CM | POA: Diagnosis not present

## 2019-04-09 DIAGNOSIS — Z5181 Encounter for therapeutic drug level monitoring: Secondary | ICD-10-CM | POA: Diagnosis not present

## 2019-04-09 DIAGNOSIS — M4310 Spondylolisthesis, site unspecified: Secondary | ICD-10-CM | POA: Diagnosis not present

## 2019-04-09 DIAGNOSIS — M4807 Spinal stenosis, lumbosacral region: Secondary | ICD-10-CM | POA: Diagnosis not present

## 2019-04-09 DIAGNOSIS — M4322 Fusion of spine, cervical region: Secondary | ICD-10-CM | POA: Diagnosis not present

## 2019-04-09 DIAGNOSIS — M47897 Other spondylosis, lumbosacral region: Secondary | ICD-10-CM | POA: Diagnosis not present

## 2019-04-09 DIAGNOSIS — G8929 Other chronic pain: Secondary | ICD-10-CM | POA: Diagnosis not present

## 2019-04-19 ENCOUNTER — Telehealth: Payer: Self-pay | Admitting: Family Medicine

## 2019-04-19 NOTE — Telephone Encounter (Signed)
°  Called to schedule Medicare Annual Wellness Visit with Nurse Health Advisor, [place]. If patient returns call, please schedule AWV with [facility]  Questions regarding scheduling, please call  [phone] or Skype > [email or web address]   [place]  ??[email or web address]   ??[id number]   1Skype

## 2019-04-26 ENCOUNTER — Other Ambulatory Visit: Payer: Self-pay | Admitting: Family Medicine

## 2019-04-26 DIAGNOSIS — N3941 Urge incontinence: Secondary | ICD-10-CM

## 2019-04-26 DIAGNOSIS — F419 Anxiety disorder, unspecified: Secondary | ICD-10-CM

## 2019-04-26 DIAGNOSIS — K219 Gastro-esophageal reflux disease without esophagitis: Secondary | ICD-10-CM

## 2019-04-26 DIAGNOSIS — F339 Major depressive disorder, recurrent, unspecified: Secondary | ICD-10-CM

## 2019-05-04 ENCOUNTER — Other Ambulatory Visit: Payer: Self-pay

## 2019-05-04 ENCOUNTER — Ambulatory Visit (INDEPENDENT_AMBULATORY_CARE_PROVIDER_SITE_OTHER): Payer: Medicare Other | Admitting: Family Medicine

## 2019-05-04 ENCOUNTER — Encounter: Payer: Self-pay | Admitting: Family Medicine

## 2019-05-04 DIAGNOSIS — Z23 Encounter for immunization: Secondary | ICD-10-CM | POA: Diagnosis not present

## 2019-05-04 DIAGNOSIS — E782 Mixed hyperlipidemia: Secondary | ICD-10-CM | POA: Diagnosis not present

## 2019-05-04 DIAGNOSIS — F419 Anxiety disorder, unspecified: Secondary | ICD-10-CM | POA: Diagnosis not present

## 2019-05-04 DIAGNOSIS — I10 Essential (primary) hypertension: Secondary | ICD-10-CM | POA: Diagnosis not present

## 2019-05-04 DIAGNOSIS — G43C0 Periodic headache syndromes in child or adult, not intractable: Secondary | ICD-10-CM | POA: Diagnosis not present

## 2019-05-04 DIAGNOSIS — E559 Vitamin D deficiency, unspecified: Secondary | ICD-10-CM

## 2019-05-04 MED ORDER — METOPROLOL SUCCINATE ER 100 MG PO TB24: 100.0000 mg | ORAL_TABLET | Freq: Every day | ORAL | 1 refills | Status: DC

## 2019-05-04 MED ORDER — EZETIMIBE 10 MG PO TABS: 10.0000 mg | ORAL_TABLET | Freq: Every day | ORAL | 1 refills | Status: DC

## 2019-05-04 MED ORDER — VITAMIN D (ERGOCALCIFEROL) 1.25 MG (50000 UNIT) PO CAPS: ORAL_CAPSULE | ORAL | 1 refills | Status: DC

## 2019-05-04 MED ORDER — BUSPIRONE HCL 30 MG PO TABS: 30.0000 mg | ORAL_TABLET | Freq: Two times a day (BID) | ORAL | 5 refills | Status: DC

## 2019-05-04 MED ORDER — TOPIRAMATE 50 MG PO TABS: 50.0000 mg | ORAL_TABLET | Freq: Two times a day (BID) | ORAL | 1 refills | Status: DC

## 2019-05-04 MED ORDER — SIMVASTATIN 80 MG PO TABS: ORAL_TABLET | ORAL | 1 refills | Status: DC

## 2019-05-04 NOTE — Patient Instructions (Signed)

## 2019-05-04 NOTE — Progress Notes (Signed)
Date:  05/04/2019   Name:  Jamie Kim   DOB:  28-Dec-1960   MRN:  [id number]   Chief Complaint: Hyperlipidemia, Hypertension, Depression, Gastroesophageal Reflux, overactive bladder, Migraine, and Flu Vaccine  Hyperlipidemia This is a chronic problem. The current episode started more than 1 year ago. Recent lipid tests were reviewed and are normal. She has no history of chronic renal disease, diabetes, hypothyroidism, liver disease, obesity or nephrotic syndrome. There are no known factors aggravating her hyperlipidemia. Pertinent negatives include no chest pain, focal sensory loss, focal weakness, leg pain, myalgias or shortness of breath. She is currently on no antihyperlipidemic treatment. The current treatment provides moderate improvement of lipids. There are no compliance problems.  Risk factors for coronary artery disease include dyslipidemia and hypertension.  Hypertension This is a chronic problem. The current episode started more than 1 year ago. The problem has been waxing and waning since onset. The problem is controlled. Pertinent negatives include no anxiety, blurred vision, chest pain, headaches, malaise/fatigue, neck pain, orthopnea, palpitations, peripheral edema, PND, shortness of breath or sweats. There are no associated agents to hypertension. Risk factors for coronary artery disease include dyslipidemia. Past treatments include beta blockers. The current treatment provides moderate improvement. There are no compliance problems.  There is no history of angina, kidney disease, CAD/MI, CVA, heart failure, left ventricular hypertrophy, PVD or retinopathy. There is no history of chronic renal disease, a hypertension causing med or renovascular disease.  Depression        This is a chronic problem.  The current episode started more than 1 year ago.   The onset quality is gradual.   The problem occurs intermittently.  The problem has been gradually improving since onset.   Associated symptoms include no decreased concentration, no fatigue, no helplessness, no hopelessness, does not have insomnia, not irritable, no restlessness, no decreased interest, no appetite change, no body aches, no myalgias, no headaches, no indigestion, not sad and no suicidal ideas.     The symptoms are aggravated by nothing.  Past treatments include SSRIs - Selective serotonin reuptake inhibitors.  Compliance with treatment is good.  Previous treatment provided moderate relief.   Pertinent negatives include no hypothyroidism and no anxiety. Gastroesophageal Reflux She reports no abdominal pain, no belching, no chest pain, no choking, no coughing, no dysphagia, no heartburn, no hoarse voice, no nausea, no sore throat or no wheezing. This is a chronic problem. The current episode started more than 1 year ago. The problem has been gradually improving. Pertinent negatives include no fatigue. Risk factors include NSAIDs. She has tried a PPI for the symptoms. The treatment provided moderate relief.  Migraine  This is a chronic problem. The current episode started more than 1 year ago. The problem has been gradually improving. The quality of the pain is described as aching. Pertinent negatives include no abdominal pain, back pain, blurred vision, coughing, dizziness, ear pain, eye pain, eye redness, fever, insomnia, nausea, neck pain, numbness, photophobia, rhinorrhea, sinus pressure, sore throat, vomiting or weakness. Her past medical history is significant for hypertension. There is no history of obesity.    Review of Systems  Constitutional: Negative.  Negative for appetite change, chills, fatigue, fever, malaise/fatigue and unexpected weight change.  HENT: Negative for congestion, ear discharge, ear pain, hoarse voice, rhinorrhea, sinus pressure, sneezing and sore throat.   Eyes: Negative for blurred vision, photophobia, pain, discharge, redness and itching.  Respiratory: Negative for cough, choking,  shortness of breath, wheezing and stridor.  Cardiovascular: Negative for chest pain, palpitations, orthopnea and PND.  Gastrointestinal: Negative for abdominal pain, blood in stool, constipation, diarrhea, dysphagia, heartburn, nausea and vomiting.  Endocrine: Negative for cold intolerance, heat intolerance, polydipsia, polyphagia and polyuria.  Genitourinary: Negative for dysuria, flank pain, frequency, hematuria, menstrual problem, pelvic pain, urgency, vaginal bleeding and vaginal discharge.  Musculoskeletal: Negative for arthralgias, back pain, myalgias and neck pain.  Skin: Negative for rash.  Allergic/Immunologic: Negative for environmental allergies and food allergies.  Neurological: Negative for dizziness, focal weakness, weakness, light-headedness, numbness and headaches.  Hematological: Negative for adenopathy. Does not bruise/bleed easily.  Psychiatric/Behavioral: Positive for depression. Negative for decreased concentration, dysphoric mood and suicidal ideas. The patient is not nervous/anxious and does not have insomnia.     Patient Active Problem List   Diagnosis Date Noted  . Family history of malignant neoplasm of gastrointestinal tract   . Benign neoplasm of ascending colon   . Benign neoplasm of transverse colon   . Polyp of sigmoid colon   . Gastric polyp   . Status post Nissen fundoplication (without gastrostomy tube) procedure   . Esophageal dysphagia   . Heartburn   . Essential hypertension 09/03/2016  . Periodic headache syndrome, not intractable 09/03/2016  . Gastroesophageal reflux disease 09/03/2016  . Multiple thyroid nodules 09/03/2016  . Vitamin D deficiency 09/03/2016  . Chronic anxiety 09/03/2016  . Depression, recurrent ([place]) 09/03/2016  . DDD (degenerative disc disease), lumbar 02/26/2015  . Bilateral occipital neuralgia 01/27/2015  . Spinal stenosis of lumbar region 11/25/2014  . DDD (degenerative disc disease), cervical 11/24/2014  . Status post  cervical spinal fusion 11/24/2014  . Lumbosacral facet joint syndrome 11/24/2014  . Sacroiliac joint dysfunction of both sides 11/24/2014  . Greater trochanteric bursitis of both hips 11/24/2014  . Cervical facet syndrome 11/24/2014  . Hyperlipidemia   . Chest pain 10/26/2012  . Palpitations 10/26/2012    Allergies  Allergen Reactions  . Codeine Itching  . Hydrocodone Itching  . Morphine Itching  . Morphine And Related   . Oxybutynin Itching  . Oxycodone Itching  . Tramadol-Acetaminophen Itching    Past Surgical History:  Procedure Laterality Date  . ANTERIOR CERVICAL DECOMP/DISCECTOMY FUSION    . BLADDER SUSPENSION    . CARDIAC CATHETERIZATION  11/23/12   [facility];EF 65%  . CARPAL TUNNEL RELEASE Bilateral   . CHOLECYSTECTOMY    . COLONOSCOPY WITH PROPOFOL N/A 06/13/2018   Procedure: COLONOSCOPY WITH BIOPSIES;  Surgeon: Virgel Manifold, MD;  Location: [place];  Service: Endoscopy;  Laterality: N/A;  . ESOPHAGOGASTRODUODENOSCOPY (EGD) WITH PROPOFOL N/A 06/13/2018   Procedure: ESOPHAGOGASTRODUODENOSCOPY (EGD) WITH BIOPSIES;  Surgeon: Virgel Manifold, MD;  Location: [place];  Service: Endoscopy;  Laterality: N/A;  . HERNIA REPAIR    . KNEE SURGERY Left   . LAPAROSCOPY    . NECK SURGERY    . NISSEN FUNDOPLICATION    . POLYPECTOMY N/A 06/13/2018   Procedure: POLYPECTOMY;  Surgeon: Virgel Manifold, MD;  Location: [place];  Service: Endoscopy;  Laterality: N/A;  . TONSILLECTOMY    . TOTAL ABDOMINAL HYSTERECTOMY      Social History   Tobacco Use  . Smoking status: Former Smoker    Packs/day: 0.50    Years: 25.00    Pack years: 12.50    Types: Cigarettes    Quit date: 2018    Years since quitting: 2.8  . Smokeless tobacco: Never Used  Substance Use Topics  . Alcohol use: No  Alcohol/week: 0.0 standard drinks    Comment: 1 month  . Drug use: No     Medication list has been reviewed and updated.  Current Meds   Medication Sig  . acetaminophen (TYLENOL) 325 MG tablet Take 650 mg by mouth every 6 (six) hours as needed.  Marland Kitchen buPROPion (WELLBUTRIN XL) 150 MG 24 hr tablet TAKE 1 TABLET BY MOUTH EVERY DAY IN THE MORNING  . busPIRone (BUSPAR) 30 MG tablet TAKE 1 TABLET (30 MG TOTAL) BY MOUTH 2 (TWO) TIMES DAILY.  . DULoxetine (CYMBALTA) 60 MG capsule Take 1-2 mg by mouth daily. Dr Primus Bravo  . esomeprazole (NEXIUM) 40 MG capsule TAKE ONE CAPSULE BY MOUTH EVERY DAY AS DIRECTED BY PROVIDER  . ezetimibe (ZETIA) 10 MG tablet Take 1 tablet (10 mg total) by mouth daily. Reported on 10/15/2015  . gabapentin (NEURONTIN) 600 MG tablet Take 600 mg by mouth 2 (two) times daily. Dr Primus Bravo  . methadone (DOLOPHINE) 5 MG tablet Limit 2-3 tabs by mouth per day if tolerated (Patient taking differently: Take 5 mg by mouth. Limit 2-3 tabs by mouth per day if tolerated- Dr Primus Bravo)  . metoprolol succinate (TOPROL-XL) 100 MG 24 hr tablet Take 1 tablet (100 mg total) by mouth daily. Take with or immediately following a meal.  . Multiple Vitamin (MULTI-VITAMINS) TABS Take by mouth.  . oxybutynin (DITROPAN-XL) 5 MG 24 hr tablet TAKE 1 TABLET BY MOUTH EVERYDAY AT BEDTIME  . simvastatin (ZOCOR) 80 MG tablet One tablet daily  . tiZANidine (ZANAFLEX) 2 MG tablet Limit  2-4 tablets by mouth per day tolerated (Patient taking differently: Take 2 mg by mouth once. Limit  2-4 tablets by mouth per day tolerated/ Dr Primus Bravo)  . topiramate (TOPAMAX) 50 MG tablet Take 1 tablet (50 mg total) by mouth 2 (two) times daily.  . Vitamin D, Ergocalciferol, (DRISDOL) 1.25 MG (50000 UT) CAPS capsule TAKE 1 CAPSULE BY MOUTH ONE TIME PER WEEK (Patient taking differently: Take 50,000 Units by mouth. derm)  . [DISCONTINUED] tolterodine (DETROL LA) 4 MG 24 hr capsule TAKE ONE CAPSULE BY MOUTH EVERY DAY   Current Facility-Administered Medications for the 05/04/19 encounter (Office Visit) with Juline Patch, MD  Medication  . bupivacaine (MARCAINE) 0.5 % injection 30 mL   . bupivacaine (PF) (MARCAINE) 0.25 % injection 30 mL  . bupivacaine (PF) (MARCAINE) 0.25 % injection 30 mL  . fentaNYL (SUBLIMAZE) injection 100 mcg  . fentaNYL (SUBLIMAZE) injection 100 mcg  . fentaNYL (SUBLIMAZE) injection 100 mcg  . lactated ringers infusion 1,000 mL  . lactated ringers infusion 1,000 mL  . lactated ringers infusion 1,000 mL  . lactated ringers infusion 1,000 mL  . lidocaine (PF) (XYLOCAINE) 1 % injection 10 mL  . midazolam (VERSED) 5 MG/5ML injection 5 mg  . midazolam (VERSED) 5 MG/5ML injection 5 mg  . midazolam (VERSED) 5 MG/5ML injection 5 mg  . orphenadrine (NORFLEX) injection 60 mg  . orphenadrine (NORFLEX) injection 60 mg  . orphenadrine (NORFLEX) injection 60 mg  . sodium chloride 0.9 % injection 20 mL  . triamcinolone acetonide (KENALOG-40) injection 40 mg  . triamcinolone acetonide (KENALOG-40) injection 40 mg  . triamcinolone acetonide (KENALOG-40) injection 40 mg    PHQ 2/9 Scores 05/04/2019 11/02/2018 05/03/2018 02/17/2016  PHQ - 2 Score 0 0 0 0  PHQ- 9 Score 0 0 0 -  Exception Documentation - - - -    BP Readings from Last 3 Encounters:  05/04/19 130/90  11/02/18 130/80  06/13/18 Marland Kitchen)  123/100    Physical Exam Vitals signs and nursing note reviewed. Exam conducted with a chaperone present.  Constitutional:      General: She is not irritable.She is not in acute distress.    Appearance: She is not diaphoretic.  HENT:     Head: Normocephalic and atraumatic.     Right Ear: Tympanic membrane, ear canal and external ear normal.     Left Ear: Tympanic membrane, ear canal and external ear normal.     Nose: Nose normal. No congestion or rhinorrhea.     Mouth/Throat:     Mouth: Mucous membranes are moist.  Eyes:     General:        Right eye: No discharge.        Left eye: No discharge.     Conjunctiva/sclera: Conjunctivae normal.     Pupils: Pupils are equal, round, and reactive to light.  Neck:     Musculoskeletal: Normal range of motion and  neck supple.     Thyroid: No thyromegaly.     Vascular: No JVD.  Cardiovascular:     Rate and Rhythm: Normal rate and regular rhythm.     Pulses: Normal pulses.     Heart sounds: Normal heart sounds. No murmur. No friction rub. No gallop.   Pulmonary:     Effort: Pulmonary effort is normal.     Breath sounds: Normal breath sounds. No wheezing or rhonchi.  Abdominal:     General: Bowel sounds are normal.     Palpations: Abdomen is soft. There is no mass.     Tenderness: There is no abdominal tenderness. There is no guarding.  Musculoskeletal: Normal range of motion.  Lymphadenopathy:     Cervical: No cervical adenopathy.  Skin:    General: Skin is warm and dry.     Capillary Refill: Capillary refill takes less than 2 seconds.     Findings: No bruising or erythema.  Neurological:     Mental Status: She is alert.     Deep Tendon Reflexes: Reflexes are normal and symmetric.     Wt Readings from Last 3 Encounters:  05/04/19 209 lb (94.8 kg)  11/02/18 207 lb (93.9 kg)  06/13/18 196 lb (88.9 kg)    BP 130/90   Pulse 80   Ht 5\' 10"  (1.778 m)   Wt 209 lb (94.8 kg)   BMI 29.99 kg/m   Assessment and Plan:  1. Chronic anxiety Chronic.  Controlled.  Stable.  Gad score 0.  Continue buspirone 30 mg twice a day. - busPIRone (BUSPAR) 30 MG tablet; Take 1 tablet (30 mg total) by mouth 2 (two) times daily.  Dispense: 60 tablet; Refill: 5  2. Mixed hyperlipidemia Chronic.  Controlled.  Stable.  Continue Zetia 10 mg once a day and simvastatin 80 mg once a day.  Will check lipid panel for level of control. - ezetimibe (ZETIA) 10 MG tablet; Take 1 tablet (10 mg total) by mouth daily. Reported on 10/15/2015  Dispense: 90 tablet; Refill: 1 - simvastatin (ZOCOR) 80 MG tablet; One tablet daily  Dispense: 90 tablet; Refill: 1 - Lipid Panel With LDL/HDL Ratio  3. Essential hypertension Chronic.  Controlled.  Continue Toprol-XL 100 mg once a day.  Will check renal function panel. - metoprolol  succinate (TOPROL-XL) 100 MG 24 hr tablet; Take 1 tablet (100 mg total) by mouth daily. Take with or immediately following a meal.  Dispense: 90 tablet; Refill: 1 - Renal Function Panel  4. Periodic headache  syndrome, not intractable Patient has history of migraines which are chronic yet controlled.  These are stable.  Patient 1 can continue with suppression therapy of Topamax 50 mg 2 times a day. - topiramate (TOPAMAX) 50 MG tablet; Take 1 tablet (50 mg total) by mouth 2 (two) times daily.  Dispense: 180 tablet; Refill: 1  5. Vitamin D deficiency Panic.  Controlled.  Continue vitamin D once a week. - Vitamin D, Ergocalciferol, (DRISDOL) 1.25 MG (50000 UT) CAPS capsule; TAKE 1 CAPSULE BY MOUTH ONE TIME PER WEEK  Dispense: 12 capsule; Refill: 1  6. Need for immunization against influenza Discussed and administered - Flu Vaccine QUAD 36+ mos IM

## 2019-05-05 LAB — RENAL FUNCTION PANEL
Albumin: 4.5 g/dL (ref 3.8–4.9)
BUN/Creatinine Ratio: 14 (ref 9–23)
BUN: 11 mg/dL (ref 6–24)
CO2: 25 mmol/L (ref 20–29)
Calcium: 9.7 mg/dL (ref 8.7–10.2)
Chloride: 104 mmol/L (ref 96–106)
Creatinine, Ser: 0.78 mg/dL (ref 0.57–1.00)
GFR calc Af Amer: 98 mL/min/{1.73_m2} (ref >59)
GFR calc non Af Amer: 85 mL/min/{1.73_m2} (ref >59)
Glucose: 115 mg/dL — ABNORMAL HIGH (ref 65–99)
Phosphorus: 4.8 mg/dL — ABNORMAL HIGH (ref 3.0–4.3)
Potassium: 4.7 mmol/L (ref 3.5–5.2)
Sodium: 141 mmol/L (ref 134–144)

## 2019-05-05 LAB — LIPID PANEL WITH LDL/HDL RATIO
Cholesterol, Total: 160 mg/dL (ref 100–199)
HDL: 57 mg/dL (ref >39)
LDL Chol Calc (NIH): 68 mg/dL (ref 0–99)
LDL/HDL Ratio: 1.2 ratio (ref 0.0–3.2)
Triglycerides: 212 mg/dL — ABNORMAL HIGH (ref 0–149)
VLDL Cholesterol Cal: 35 mg/dL (ref 5–40)

## 2019-05-07 DIAGNOSIS — M5030 Other cervical disc degeneration, unspecified cervical region: Secondary | ICD-10-CM | POA: Diagnosis not present

## 2019-05-07 DIAGNOSIS — G894 Chronic pain syndrome: Secondary | ICD-10-CM | POA: Diagnosis not present

## 2019-05-07 DIAGNOSIS — M5481 Occipital neuralgia: Secondary | ICD-10-CM | POA: Diagnosis not present

## 2019-05-07 DIAGNOSIS — M545 Low back pain: Secondary | ICD-10-CM | POA: Diagnosis not present

## 2019-06-04 DIAGNOSIS — M5030 Other cervical disc degeneration, unspecified cervical region: Secondary | ICD-10-CM | POA: Diagnosis not present

## 2019-06-04 DIAGNOSIS — G894 Chronic pain syndrome: Secondary | ICD-10-CM | POA: Diagnosis not present

## 2019-06-04 DIAGNOSIS — M545 Low back pain: Secondary | ICD-10-CM | POA: Diagnosis not present

## 2019-06-04 DIAGNOSIS — M5481 Occipital neuralgia: Secondary | ICD-10-CM | POA: Diagnosis not present

## 2019-06-18 ENCOUNTER — Other Ambulatory Visit: Payer: Self-pay | Admitting: Family Medicine

## 2019-06-18 DIAGNOSIS — F419 Anxiety disorder, unspecified: Secondary | ICD-10-CM

## 2019-06-18 DIAGNOSIS — M5481 Occipital neuralgia: Secondary | ICD-10-CM | POA: Diagnosis not present

## 2019-06-18 DIAGNOSIS — M5030 Other cervical disc degeneration, unspecified cervical region: Secondary | ICD-10-CM | POA: Diagnosis not present

## 2019-06-18 DIAGNOSIS — M545 Low back pain: Secondary | ICD-10-CM | POA: Diagnosis not present

## 2019-06-18 DIAGNOSIS — G894 Chronic pain syndrome: Secondary | ICD-10-CM | POA: Diagnosis not present

## 2019-07-24 ENCOUNTER — Other Ambulatory Visit: Payer: Self-pay | Admitting: Family Medicine

## 2019-07-24 DIAGNOSIS — F419 Anxiety disorder, unspecified: Secondary | ICD-10-CM

## 2019-07-30 DIAGNOSIS — H2021 Lens-induced iridocyclitis, right eye: Secondary | ICD-10-CM | POA: Diagnosis not present

## 2019-08-08 DIAGNOSIS — M47897 Other spondylosis, lumbosacral region: Secondary | ICD-10-CM | POA: Diagnosis not present

## 2019-08-08 DIAGNOSIS — R519 Headache, unspecified: Secondary | ICD-10-CM | POA: Diagnosis not present

## 2019-08-08 DIAGNOSIS — M792 Neuralgia and neuritis, unspecified: Secondary | ICD-10-CM | POA: Diagnosis not present

## 2019-08-08 DIAGNOSIS — G8929 Other chronic pain: Secondary | ICD-10-CM | POA: Diagnosis not present

## 2019-08-08 DIAGNOSIS — G5600 Carpal tunnel syndrome, unspecified upper limb: Secondary | ICD-10-CM | POA: Diagnosis not present

## 2019-08-08 DIAGNOSIS — M5030 Other cervical disc degeneration, unspecified cervical region: Secondary | ICD-10-CM | POA: Diagnosis not present

## 2019-08-08 DIAGNOSIS — M48062 Spinal stenosis, lumbar region with neurogenic claudication: Secondary | ICD-10-CM | POA: Diagnosis not present

## 2019-08-08 DIAGNOSIS — G894 Chronic pain syndrome: Secondary | ICD-10-CM | POA: Diagnosis not present

## 2019-08-08 DIAGNOSIS — M545 Low back pain: Secondary | ICD-10-CM | POA: Diagnosis not present

## 2019-08-08 DIAGNOSIS — M25559 Pain in unspecified hip: Secondary | ICD-10-CM | POA: Diagnosis not present

## 2019-08-14 ENCOUNTER — Other Ambulatory Visit: Payer: Self-pay | Admitting: Family Medicine

## 2019-08-14 DIAGNOSIS — F419 Anxiety disorder, unspecified: Secondary | ICD-10-CM

## 2019-08-30 ENCOUNTER — Other Ambulatory Visit: Payer: Self-pay | Admitting: Family Medicine

## 2019-08-30 DIAGNOSIS — F419 Anxiety disorder, unspecified: Secondary | ICD-10-CM

## 2019-09-04 DIAGNOSIS — M545 Low back pain: Secondary | ICD-10-CM | POA: Diagnosis not present

## 2019-09-04 DIAGNOSIS — G894 Chronic pain syndrome: Secondary | ICD-10-CM | POA: Diagnosis not present

## 2019-09-04 DIAGNOSIS — M5030 Other cervical disc degeneration, unspecified cervical region: Secondary | ICD-10-CM | POA: Diagnosis not present

## 2019-10-02 DIAGNOSIS — M545 Low back pain: Secondary | ICD-10-CM | POA: Diagnosis not present

## 2019-10-02 DIAGNOSIS — M5030 Other cervical disc degeneration, unspecified cervical region: Secondary | ICD-10-CM | POA: Diagnosis not present

## 2019-10-02 DIAGNOSIS — G894 Chronic pain syndrome: Secondary | ICD-10-CM | POA: Diagnosis not present

## 2019-10-02 DIAGNOSIS — M1990 Unspecified osteoarthritis, unspecified site: Secondary | ICD-10-CM | POA: Diagnosis not present

## 2019-10-16 ENCOUNTER — Other Ambulatory Visit: Payer: Self-pay | Admitting: Family Medicine

## 2019-10-24 ENCOUNTER — Other Ambulatory Visit: Payer: Self-pay | Admitting: Family Medicine

## 2019-10-24 DIAGNOSIS — N3941 Urge incontinence: Secondary | ICD-10-CM

## 2019-10-24 DIAGNOSIS — K219 Gastro-esophageal reflux disease without esophagitis: Secondary | ICD-10-CM

## 2019-10-24 DIAGNOSIS — F339 Major depressive disorder, recurrent, unspecified: Secondary | ICD-10-CM

## 2019-10-24 NOTE — Telephone Encounter (Signed)
Requested Prescriptions  Pending Prescriptions Disp Refills  . buPROPion (WELLBUTRIN XL) 150 MG 24 hr tablet [Pharmacy Med Name: BUPROPION HCL XL 150 MG TABLET] 90 tablet 0    Sig: TAKE 1 TABLET BY MOUTH EVERY DAY IN THE MORNING     Psychiatry: Antidepressants - bupropion Failed - 10/24/2019  1:20 AM      Failed - Last BP in normal range    BP Readings from Last 1 Encounters:  05/04/19 130/90         Passed - Completed PHQ-2 or PHQ-9 in the last 360 days.      Passed - Valid encounter within last 6 months    Recent Outpatient Visits          5 months ago Chronic anxiety   [facility] Juline Patch, MD   11 months ago Essential hypertension   [facility] Juline Patch, MD   1 year ago Essential hypertension   [facility] Juline Patch, MD   1 year ago Fall, initial encounter   [facility] Juline Patch, MD   2 years ago Essential hypertension   [facility] Juline Patch, MD      Future Appointments            In 1 week Juline Patch, MD [facility]           . esomeprazole ([place]) 40 MG capsule [Pharmacy Med Name: ESOMEPRAZOLE MAG DR 40 MG CAP] 90 capsule 2    Sig: TAKE ONE CAPSULE BY MOUTH EVERY DAY AS DIRECTED BY PROVIDER     Gastroenterology: Proton Pump Inhibitors Passed - 10/24/2019  1:20 AM      Passed - Valid encounter within last 12 months    Recent Outpatient Visits          5 months ago Chronic anxiety   [facility] Juline Patch, MD   11 months ago Essential hypertension   [facility] Juline Patch, MD   1 year ago Essential hypertension   [facility] Juline Patch, MD   1 year ago Fall, initial encounter   [facility] Juline Patch, MD   2 years ago Essential hypertension   [place], Deanna C, MD      Future Appointments            In 1 week Juline Patch, MD [facility], [facility]           .  oxybutynin (DITROPAN-XL) 5 MG 24 hr tablet [Pharmacy Med Name: OXYBUTYNIN CL ER 5 MG TABLET] 90 tablet 1    Sig: TAKE 1 TABLET BY MOUTH EVERYDAY AT BEDTIME     Urology:  Bladder Agents Passed - 10/24/2019  1:20 AM      Passed - Valid encounter within last 12 months    Recent Outpatient Visits          5 months ago Chronic anxiety   [facility] Juline Patch, MD   11 months ago Essential hypertension   [facility] Juline Patch, MD   1 year ago Essential hypertension   [facility] Juline Patch, MD   1 year ago Fall, initial encounter   [facility] Juline Patch, MD   2 years ago Essential hypertension   [facility] Juline Patch, MD      Future Appointments  In 1 week Juline Patch, MD [place]

## 2019-11-02 ENCOUNTER — Ambulatory Visit: Payer: Medicare Other | Admitting: Family Medicine

## 2019-11-14 DIAGNOSIS — M545 Low back pain: Secondary | ICD-10-CM | POA: Diagnosis not present

## 2019-11-14 DIAGNOSIS — M5481 Occipital neuralgia: Secondary | ICD-10-CM | POA: Diagnosis not present

## 2019-11-14 DIAGNOSIS — G894 Chronic pain syndrome: Secondary | ICD-10-CM | POA: Diagnosis not present

## 2019-11-14 DIAGNOSIS — M4322 Fusion of spine, cervical region: Secondary | ICD-10-CM | POA: Diagnosis not present

## 2019-11-14 DIAGNOSIS — M1990 Unspecified osteoarthritis, unspecified site: Secondary | ICD-10-CM | POA: Diagnosis not present

## 2019-12-01 ENCOUNTER — Other Ambulatory Visit: Payer: Self-pay | Admitting: Family Medicine

## 2019-12-01 DIAGNOSIS — G43C0 Periodic headache syndromes in child or adult, not intractable: Secondary | ICD-10-CM

## 2019-12-01 DIAGNOSIS — E782 Mixed hyperlipidemia: Secondary | ICD-10-CM

## 2019-12-01 NOTE — Telephone Encounter (Signed)
Requested Prescriptions  Pending Prescriptions Disp Refills  . topiramate (TOPAMAX) 50 MG tablet [Pharmacy Med Name: TOPIRAMATE 50 MG TABLET] 180 tablet     Sig: TAKE 1 TABLET BY MOUTH TWICE A DAY     Not Delegated - Neurology: Anticonvulsants - topiramate & zonisamide Failed - 12/01/2019  9:44 AM      Failed - This refill cannot be delegated      Passed - Cr in normal range and within 360 days    Creatinine  Date Value Ref Range Status  10/02/2012 1.05 0.60 - 1.30 mg/dL Final   Creatinine, Ser  Date Value Ref Range Status  05/04/2019 0.78 0.57 - 1.00 mg/dL Final         Passed - CO2 in normal range and within 360 days    CO2  Date Value Ref Range Status  05/04/2019 25 20 - 29 mmol/L Final   Co2  Date Value Ref Range Status  10/02/2012 22 21 - 32 mmol/L Final         Passed - Valid encounter within last 12 months    Recent Outpatient Visits          7 months ago Chronic anxiety   [place], MD   1 year ago Essential hypertension   [place], Deanna C, MD   1 year ago Essential hypertension   [place], Deanna C, MD   2 years ago Fall, initial encounter   [facility] Juline Patch, MD   2 years ago Essential hypertension   [place], Deanna C, MD      Future Appointments            In 6 days Juline Patch, MD [facility], [place]           . simvastatin (ZOCOR) 80 MG tablet [Pharmacy Med Name: SIMVASTATIN 80 MG TABLET] 90 tablet 1    Sig: TAKE 1 TABLET BY MOUTH EVERY DAY     Cardiovascular:  Antilipid - Statins Failed - 12/01/2019  9:44 AM      Failed - LDL in normal range and within 360 days    LDL Chol Calc (NIH)  Date Value Ref Range Status  05/04/2019 68 0 - 99 mg/dL Final         Failed - Triglycerides in normal range and within 360 days    Triglycerides  Date Value Ref Range Status  05/04/2019 212 (H) 0 - 149 mg/dL Final         Passed - Total  Cholesterol in normal range and within 360 days    Cholesterol, Total  Date Value Ref Range Status  05/04/2019 160 100 - 199 mg/dL Final         Passed - HDL in normal range and within 360 days    HDL  Date Value Ref Range Status  05/04/2019 57 >39 mg/dL Final         Passed - Patient is not pregnant      Passed - Valid encounter within last 12 months    Recent Outpatient Visits          7 months ago Chronic anxiety   [facility] Juline Patch, MD   1 year ago Essential hypertension   [place], Deanna C, MD   1 year ago Essential hypertension   [facility] Juline Patch, MD   2 years  ago Fall, initial encounter   [place], MD   2 years ago Essential hypertension   [place], MD      Future Appointments            In 6 days Juline Patch, MD [facility], [facility]           . ezetimibe (ZETIA) 10 MG tablet [Pharmacy Med Name: EZETIMIBE 10 MG TABLET] 90 tablet 1    Sig: TAKE 1 TABLET (10 MG TOTAL) BY MOUTH DAILY. REPORTED ON 10/15/2015     Cardiovascular:  Antilipid - Sterol Transport Inhibitors Failed - 12/01/2019  9:44 AM      Failed - LDL in normal range and within 360 days    LDL Chol Calc (NIH)  Date Value Ref Range Status  05/04/2019 68 0 - 99 mg/dL Final         Failed - Triglycerides in normal range and within 360 days    Triglycerides  Date Value Ref Range Status  05/04/2019 212 (H) 0 - 149 mg/dL Final         Passed - Total Cholesterol in normal range and within 360 days    Cholesterol, Total  Date Value Ref Range Status  05/04/2019 160 100 - 199 mg/dL Final         Passed - HDL in normal range and within 360 days    HDL  Date Value Ref Range Status  05/04/2019 57 >39 mg/dL Final         Passed - Valid encounter within last 12 months    Recent Outpatient Visits          7 months ago Chronic anxiety   [place], Deanna  C, MD   1 year ago Essential hypertension   [place], Deanna C, MD   1 year ago Essential hypertension   [facility] Juline Patch, MD   2 years ago Fall, initial encounter   [facility] Juline Patch, MD   2 years ago Essential hypertension   [place], Deanna C, MD      Future Appointments            In 6 days Juline Patch, MD [facility]

## 2019-12-01 NOTE — Telephone Encounter (Signed)
Requested medication (s) are due for refill today: yes  Requested medication (s) are on the active medication list: yes  Last refill:  05/04/19  Future visit scheduled: yes  Notes to clinic:  Med not delegated to NT to RF   Requested Prescriptions  Pending Prescriptions Disp Refills   topiramate (TOPAMAX) 50 MG tablet [Pharmacy Med Name: TOPIRAMATE 50 MG TABLET] 180 tablet     Sig: TAKE 1 TABLET BY MOUTH TWICE A DAY      Not Delegated - Neurology: Anticonvulsants - topiramate & zonisamide Failed - 12/01/2019  9:44 AM      Failed - This refill cannot be delegated      Passed - Cr in normal range and within 360 days    Creatinine  Date Value Ref Range Status  10/02/2012 1.05 0.60 - 1.30 mg/dL Final   Creatinine, Ser  Date Value Ref Range Status  05/04/2019 0.78 0.57 - 1.00 mg/dL Final          Passed - CO2 in normal range and within 360 days    CO2  Date Value Ref Range Status  05/04/2019 25 20 - 29 mmol/L Final   Co2  Date Value Ref Range Status  10/02/2012 22 21 - 32 mmol/L Final          Passed - Valid encounter within last 12 months    Recent Outpatient Visits           7 months ago Chronic anxiety   [facility] Juline Patch, MD   1 year ago Essential hypertension   [facility] Juline Patch, MD   1 year ago Essential hypertension   [facility] Juline Patch, MD   2 years ago Fall, initial encounter   [facility] Juline Patch, MD   2 years ago Essential hypertension   [place], Deanna C, MD       Future Appointments             In 6 days Juline Patch, MD [facility]             Signed Prescriptions Disp Refills   simvastatin (ZOCOR) 80 MG tablet 90 tablet 1    Sig: TAKE 1 TABLET BY MOUTH EVERY DAY      Cardiovascular:  Antilipid - Statins Failed - 12/01/2019  9:44 AM      Failed - LDL in normal range and within 360 days    LDL Chol Calc (NIH)  Date Value  Ref Range Status  05/04/2019 68 0 - 99 mg/dL Final          Failed - Triglycerides in normal range and within 360 days    Triglycerides  Date Value Ref Range Status  05/04/2019 212 (H) 0 - 149 mg/dL Final          Passed - Total Cholesterol in normal range and within 360 days    Cholesterol, Total  Date Value Ref Range Status  05/04/2019 160 100 - 199 mg/dL Final          Passed - HDL in normal range and within 360 days    HDL  Date Value Ref Range Status  05/04/2019 57 >39 mg/dL Final          Passed - Patient is not pregnant      Passed - Valid encounter within last 12 months    Recent Outpatient Visits  7 months ago Chronic anxiety   [facility] Juline Patch, MD   1 year ago Essential hypertension   [facility] Juline Patch, MD   1 year ago Essential hypertension   [facility] Juline Patch, MD   2 years ago Fall, initial encounter   [facility] Juline Patch, MD   2 years ago Essential hypertension   [place], MD       Future Appointments             In 6 days Juline Patch, MD [facility], [facility]              ezetimibe (ZETIA) 10 MG tablet 90 tablet 1    Sig: TAKE 1 TABLET (10 MG TOTAL) BY MOUTH DAILY. REPORTED ON 10/15/2015      Cardiovascular:  Antilipid - Sterol Transport Inhibitors Failed - 12/01/2019  9:44 AM      Failed - LDL in normal range and within 360 days    LDL Chol Calc (NIH)  Date Value Ref Range Status  05/04/2019 68 0 - 99 mg/dL Final          Failed - Triglycerides in normal range and within 360 days    Triglycerides  Date Value Ref Range Status  05/04/2019 212 (H) 0 - 149 mg/dL Final          Passed - Total Cholesterol in normal range and within 360 days    Cholesterol, Total  Date Value Ref Range Status  05/04/2019 160 100 - 199 mg/dL Final          Passed - HDL in normal range and within 360 days    HDL  Date Value  Ref Range Status  05/04/2019 57 >39 mg/dL Final          Passed - Valid encounter within last 12 months    Recent Outpatient Visits           7 months ago Chronic anxiety   [place], Deanna C, MD   1 year ago Essential hypertension   [place], Deanna C, MD   1 year ago Essential hypertension   [facility] Juline Patch, MD   2 years ago Fall, initial encounter   [facility] Juline Patch, MD   2 years ago Essential hypertension   [place], Deanna C, MD       Future Appointments             In 6 days Juline Patch, MD [facility], [facility]

## 2019-12-07 ENCOUNTER — Other Ambulatory Visit: Payer: Self-pay

## 2019-12-07 ENCOUNTER — Encounter: Payer: Self-pay | Admitting: Family Medicine

## 2019-12-07 ENCOUNTER — Ambulatory Visit (INDEPENDENT_AMBULATORY_CARE_PROVIDER_SITE_OTHER): Payer: Medicare Other | Admitting: Family Medicine

## 2019-12-07 VITALS — BP 124/78 | HR 78 | Ht 70.0 in | Wt 198.0 lb

## 2019-12-07 DIAGNOSIS — E782 Mixed hyperlipidemia: Secondary | ICD-10-CM | POA: Diagnosis not present

## 2019-12-07 DIAGNOSIS — E882 Lipomatosis, not elsewhere classified: Secondary | ICD-10-CM | POA: Diagnosis not present

## 2019-12-07 DIAGNOSIS — Z1231 Encounter for screening mammogram for malignant neoplasm of breast: Secondary | ICD-10-CM

## 2019-12-07 DIAGNOSIS — K219 Gastro-esophageal reflux disease without esophagitis: Secondary | ICD-10-CM

## 2019-12-07 DIAGNOSIS — F339 Major depressive disorder, recurrent, unspecified: Secondary | ICD-10-CM

## 2019-12-07 DIAGNOSIS — I10 Essential (primary) hypertension: Secondary | ICD-10-CM

## 2019-12-07 DIAGNOSIS — E042 Nontoxic multinodular goiter: Secondary | ICD-10-CM | POA: Diagnosis not present

## 2019-12-07 DIAGNOSIS — F419 Anxiety disorder, unspecified: Secondary | ICD-10-CM

## 2019-12-07 MED ORDER — METOPROLOL SUCCINATE ER 100 MG PO TB24: 100.0000 mg | ORAL_TABLET | Freq: Every day | ORAL | 1 refills | Status: DC

## 2019-12-07 MED ORDER — BUSPIRONE HCL 30 MG PO TABS: 30.0000 mg | ORAL_TABLET | Freq: Two times a day (BID) | ORAL | 1 refills | Status: DC

## 2019-12-07 MED ORDER — SIMVASTATIN 80 MG PO TABS: ORAL_TABLET | ORAL | 1 refills | Status: DC

## 2019-12-07 MED ORDER — ESOMEPRAZOLE MAGNESIUM 40 MG PO CPDR: DELAYED_RELEASE_CAPSULE | ORAL | 2 refills | Status: DC

## 2019-12-07 MED ORDER — EZETIMIBE 10 MG PO TABS: 10.0000 mg | ORAL_TABLET | Freq: Every day | ORAL | 1 refills | Status: DC

## 2019-12-07 MED ORDER — BUPROPION HCL ER (XL) 150 MG PO TB24: ORAL_TABLET | ORAL | 1 refills | Status: DC

## 2019-12-07 NOTE — Progress Notes (Signed)
Date:  12/07/2019   Name:  Jamie Kim   DOB:  1960/09/11   MRN:  [id number]   Chief Complaint: Hypertension, Anxiety, and Hyperlipidemia  Hypertension This is a chronic problem. The current episode started more than 1 year ago. The problem has been gradually improving since onset. The problem is controlled. Associated symptoms include anxiety. Pertinent negatives include no blurred vision, chest pain, headaches, malaise/fatigue, neck pain, orthopnea, palpitations, peripheral edema, PND, shortness of breath or sweats. There are no associated agents to hypertension. Risk factors for coronary artery disease include dyslipidemia. Past treatments include beta blockers. The current treatment provides moderate improvement. There is no history of angina, kidney disease, CAD/MI, CVA, heart failure, left ventricular hypertrophy, PVD or retinopathy. There is no history of chronic renal disease, a hypertension causing med or renovascular disease.  Anxiety Presents for follow-up visit. Patient reports no chest pain, decreased concentration, dizziness, excessive worry, insomnia, irritability, muscle tension, nausea, nervous/anxious behavior, palpitations, panic, restlessness, shortness of breath or suicidal ideas. Symptoms occur occasionally.   Compliance with medications is 76-100% (buspirone).  Hyperlipidemia This is a chronic problem. The problem is controlled. Recent lipid tests were reviewed and are normal. She has no history of chronic renal disease. There are no known factors aggravating her hyperlipidemia. Pertinent negatives include no chest pain, focal sensory loss, focal weakness, leg pain, myalgias or shortness of breath. Current antihyperlipidemic treatment includes statins and ezetimibe. The current treatment provides moderate improvement of lipids. There are no compliance problems.  There are no known risk factors for coronary artery disease.  Depression        This is a chronic problem.   The current episode started more than 1 year ago.   The onset quality is gradual.   The problem has been waxing and waning since onset.  Associated symptoms include no decreased concentration, no fatigue, no helplessness, no hopelessness, does not have insomnia, not irritable, no restlessness, no decreased interest, no appetite change, no body aches, no myalgias, no headaches, no indigestion, not sad and no suicidal ideas.  Past treatments include other medications (bupropion).  Compliance with treatment is variable.  Past medical history includes anxiety.     Lab Results  Component Value Date   CREATININE 0.78 05/04/2019   BUN 11 05/04/2019   NA 141 05/04/2019   K 4.7 05/04/2019   CL 104 05/04/2019   CO2 25 05/04/2019   Lab Results  Component Value Date   CHOL 160 05/04/2019   HDL 57 05/04/2019   LDLCALC 68 05/04/2019   TRIG 212 (H) 05/04/2019   CHOLHDL 2.5 11/02/2018   Lab Results  Component Value Date   TSH 1.970 11/02/2018   No results found for: HGBA1C Lab Results  Component Value Date   WBC 4.9 11/20/2012   HGB 12.2 11/20/2012   HCT 36.0 11/20/2012   MCV 91 11/20/2012   PLT 396 (H) 11/20/2012   Lab Results  Component Value Date   ALT 13 11/02/2018   AST 17 11/02/2018   ALKPHOS 68 11/02/2018   BILITOT <0.2 11/02/2018     Review of Systems  Constitutional: Negative.  Negative for appetite change, chills, fatigue, fever, irritability, malaise/fatigue and unexpected weight change.  HENT: Negative for congestion, ear discharge, ear pain, rhinorrhea, sinus pressure, sneezing and sore throat.   Eyes: Negative for blurred vision, photophobia, pain, discharge, redness and itching.  Respiratory: Negative for cough, shortness of breath, wheezing and stridor.   Cardiovascular: Negative for chest pain, palpitations,  orthopnea and PND.  Gastrointestinal: Negative for abdominal pain, blood in stool, constipation, diarrhea, nausea and vomiting.  Endocrine: Negative for cold  intolerance, heat intolerance, polydipsia, polyphagia and polyuria.  Genitourinary: Negative for dysuria, flank pain, frequency, hematuria, menstrual problem, pelvic pain, urgency, vaginal bleeding and vaginal discharge.  Musculoskeletal: Negative for arthralgias, back pain, myalgias and neck pain.  Skin: Negative for rash.  Allergic/Immunologic: Negative for environmental allergies and food allergies.  Neurological: Negative for dizziness, focal weakness, weakness, light-headedness, numbness and headaches.  Hematological: Negative for adenopathy. Does not bruise/bleed easily.  Psychiatric/Behavioral: Positive for depression. Negative for decreased concentration, dysphoric mood and suicidal ideas. The patient is not nervous/anxious and does not have insomnia.     Patient Active Problem List   Diagnosis Date Noted  . 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 Family history of malignant neoplasm of gastrointestinal tract   . Benign neoplasm of ascending colon   . Benign neoplasm of transverse colon   . Polyp of sigmoid colon   . Gastric polyp   . Status post Nissen fundoplication (without gastrostomy tube) procedure   . Esophageal dysphagia   . Heartburn   . Essential hypertension 09/03/2016  . Periodic headache syndrome, not intractable 09/03/2016  . Gastroesophageal reflux disease 09/03/2016  . Multiple thyroid nodules 09/03/2016  . Vitamin D deficiency 09/03/2016  . Chronic anxiety 09/03/2016  . Depression, recurrent ([place]) 09/03/2016  . DDD (degenerative disc disease), lumbar 02/26/2015  . Bilateral occipital neuralgia 01/27/2015  . Spinal stenosis of lumbar region 11/25/2014  . DDD (degenerative disc disease), cervical 11/24/2014  . Status post cervical spinal fusion 11/24/2014  . Lumbosacral facet joint syndrome 11/24/2014  . Sacroiliac joint dysfunction of both sides 11/24/2014  . Greater trochanteric bursitis of both hips 11/24/2014  . Cervical facet syndrome 11/24/2014  . Hyperlipidemia    . Chest pain 10/26/2012  . Palpitations 10/26/2012    Allergies  Allergen Reactions  . Codeine Itching  . Hydrocodone Itching  . Morphine Itching  . Morphine And Related   . Oxybutynin Itching  . Oxycodone Itching  . Tramadol-Acetaminophen Itching    Past Surgical History:  Procedure Laterality Date  . ANTERIOR CERVICAL DECOMP/DISCECTOMY FUSION    . BLADDER SUSPENSION    . CARDIAC CATHETERIZATION  11/23/12   [facility];EF 65%  . CARPAL TUNNEL RELEASE Bilateral   . CHOLECYSTECTOMY    . COLONOSCOPY WITH PROPOFOL N/A 06/13/2018   Procedure: COLONOSCOPY WITH BIOPSIES;  Surgeon: Virgel Manifold, MD;  Location: [place];  Service: Endoscopy;  Laterality: N/A;  . ESOPHAGOGASTRODUODENOSCOPY (EGD) WITH PROPOFOL N/A 06/13/2018   Procedure: ESOPHAGOGASTRODUODENOSCOPY (EGD) WITH BIOPSIES;  Surgeon: Virgel Manifold, MD;  Location: [place];  Service: Endoscopy;  Laterality: N/A;  . HERNIA REPAIR    . KNEE SURGERY Left   . LAPAROSCOPY    . NECK SURGERY    . NISSEN FUNDOPLICATION    . POLYPECTOMY N/A 06/13/2018   Procedure: POLYPECTOMY;  Surgeon: Virgel Manifold, MD;  Location: [place];  Service: Endoscopy;  Laterality: N/A;  . TONSILLECTOMY    . TOTAL ABDOMINAL HYSTERECTOMY      Social History   Tobacco Use  . Smoking status: Former Smoker    Packs/day: 0.50    Years: 25.00    Pack years: 12.50    Types: Cigarettes    Quit date: 2018    Years since quitting: 3.4  . Smokeless tobacco: Never Used  Substance Use Topics  . Alcohol use: No  Alcohol/week: 0.0 standard drinks    Comment: 1 month  . Drug use: No     Medication list has been reviewed and updated.  Current Meds  Medication Sig  . acetaminophen (TYLENOL) 325 MG tablet Take 650 mg by mouth every 6 (six) hours as needed.  Marland Kitchen buPROPion (WELLBUTRIN XL) 150 MG 24 hr tablet TAKE 1 TABLET BY MOUTH EVERY DAY IN THE MORNING  . busPIRone (BUSPAR) 30 MG tablet TAKE 1 TABLET (30  MG TOTAL) BY MOUTH 2 (TWO) TIMES DAILY.  . DULoxetine (CYMBALTA) 60 MG capsule Take 1-2 mg by mouth daily. Dr Primus Bravo  . esomeprazole (NEXIUM) 40 MG capsule TAKE ONE CAPSULE BY MOUTH EVERY DAY AS DIRECTED BY PROVIDER  . ezetimibe (ZETIA) 10 MG tablet TAKE 1 TABLET (10 MG TOTAL) BY MOUTH DAILY. REPORTED ON 10/15/2015  . gabapentin (NEURONTIN) 600 MG tablet Take 600 mg by mouth 2 (two) times daily. Dr Primus Bravo  . methadone (DOLOPHINE) 5 MG tablet Limit 2-3 tabs by mouth per day if tolerated (Patient taking differently: Take 5 mg by mouth. Limit 2-3 tabs by mouth per day if tolerated- Dr Primus Bravo)  . metoprolol succinate (TOPROL-XL) 100 MG 24 hr tablet Take 1 tablet (100 mg total) by mouth daily. Take with or immediately following a meal.  . Multiple Vitamin (MULTI-VITAMINS) TABS Take by mouth.  . oxybutynin (DITROPAN-XL) 5 MG 24 hr tablet TAKE 1 TABLET BY MOUTH EVERYDAY AT BEDTIME  . simvastatin (ZOCOR) 80 MG tablet TAKE 1 TABLET BY MOUTH EVERY DAY  . tiZANidine (ZANAFLEX) 2 MG tablet Limit  2-4 tablets by mouth per day tolerated (Patient taking differently: Take 2 mg by mouth once. Limit  2-4 tablets by mouth per day tolerated/ Dr Primus Bravo)  . topiramate (TOPAMAX) 50 MG tablet TAKE 1 TABLET BY MOUTH TWICE A DAY  . Vitamin D, Ergocalciferol, (DRISDOL) 1.25 MG (50000 UT) CAPS capsule TAKE 1 CAPSULE BY MOUTH ONE TIME PER WEEK   Current Facility-Administered Medications for the 12/07/19 encounter (Office Visit) with Juline Patch, MD  Medication  . bupivacaine (MARCAINE) 0.5 % injection 30 mL  . bupivacaine (PF) (MARCAINE) 0.25 % injection 30 mL  . bupivacaine (PF) (MARCAINE) 0.25 % injection 30 mL  . fentaNYL (SUBLIMAZE) injection 100 mcg  . fentaNYL (SUBLIMAZE) injection 100 mcg  . fentaNYL (SUBLIMAZE) injection 100 mcg  . lactated ringers infusion 1,000 mL  . lactated ringers infusion 1,000 mL  . lactated ringers infusion 1,000 mL  . lactated ringers infusion 1,000 mL  . lidocaine (PF) (XYLOCAINE) 1 %  injection 10 mL  . midazolam (VERSED) 5 MG/5ML injection 5 mg  . midazolam (VERSED) 5 MG/5ML injection 5 mg  . midazolam (VERSED) 5 MG/5ML injection 5 mg  . orphenadrine (NORFLEX) injection 60 mg  . orphenadrine (NORFLEX) injection 60 mg  . orphenadrine (NORFLEX) injection 60 mg  . sodium chloride 0.9 % injection 20 mL  . triamcinolone acetonide (KENALOG-40) injection 40 mg  . triamcinolone acetonide (KENALOG-40) injection 40 mg  . triamcinolone acetonide (KENALOG-40) injection 40 mg    PHQ 2/9 Scores 12/07/2019 05/04/2019 11/02/2018 05/03/2018  PHQ - 2 Score 0 0 0 0  PHQ- 9 Score 0 0 0 0  Exception Documentation - - - -    BP Readings from Last 3 Encounters:  12/07/19 124/78  05/04/19 130/90  11/02/18 130/80    Physical Exam Vitals and nursing note reviewed.  Constitutional:      General: She is not irritable.She is not in acute distress.  Appearance: She is not diaphoretic.  HENT:     Head: Normocephalic and atraumatic.     Right Ear: Tympanic membrane, ear canal and external ear normal. There is no impacted cerumen.     Left Ear: Tympanic membrane, ear canal and external ear normal. There is no impacted cerumen.     Nose: Nose normal. No congestion or rhinorrhea.  Eyes:     General:        Right eye: No discharge.        Left eye: No discharge.     Conjunctiva/sclera: Conjunctivae normal.     Pupils: Pupils are equal, round, and reactive to light.  Neck:     Thyroid: No thyromegaly.     Vascular: No JVD.  Cardiovascular:     Rate and Rhythm: Normal rate and regular rhythm.     Heart sounds: Normal heart sounds. No murmur. No friction rub. No gallop.   Pulmonary:     Effort: Pulmonary effort is normal.     Breath sounds: Normal breath sounds. No wheezing or rhonchi.  Abdominal:     General: Bowel sounds are normal.     Palpations: Abdomen is soft. There is no mass.     Tenderness: There is no abdominal tenderness. There is no guarding.  Musculoskeletal:         General: Normal range of motion.     Cervical back: Normal range of motion and neck supple.  Lymphadenopathy:     Cervical: No cervical adenopathy.  Skin:    General: Skin is warm and dry.  Neurological:     Mental Status: She is alert.     Deep Tendon Reflexes: Reflexes are normal and symmetric.     Wt Readings from Last 3 Encounters:  12/07/19 198 lb (89.8 kg)  05/04/19 209 lb (94.8 kg)  11/02/18 207 lb (93.9 kg)    BP 124/78   Pulse 78   Ht 5\' 10"  (1.778 m)   Wt 198 lb (89.8 kg)   SpO2 100%   BMI 28.41 kg/m   Assessment and Plan:  1. Essential hypertension Chronic.  Controlled.  Stable.  Continue metoprolol XL 100 mg once a day. - metoprolol succinate (TOPROL-XL) 100 MG 24 hr tablet; Take 1 tablet (100 mg total) by mouth daily. Take with or immediately following a meal.  Dispense: 90 tablet; Refill: 1  2. Depression, recurrent (HCC) Chronic.  Controlled.  Stable.  PHQ 0.  Continue bupropion XL 150 mg once a day. - buPROPion (WELLBUTRIN XL) 150 MG 24 hr tablet; Take 1 tablet by mouth every morning  Dispense: 180 tablet; Refill: 1  3. Chronic anxiety Chronic.  Controlled.  Stable.  Gad score 0.  Continue buspirone 30 mg 1 tablet twice a day. - busPIRone (BUSPAR) 30 MG tablet; Take 1 tablet (30 mg total) by mouth 2 (two) times daily.  Dispense: 180 tablet; Refill: 1  4. Mixed hyperlipidemia Chronic.  Controlled.  Stable.  Continue on Zetia 10 mg once a day and simvastatin 80 mg once a day.  Patient is not having any muscle discomfort and we will continue at this dosing. - ezetimibe (ZETIA) 10 MG tablet; Take 1 tablet (10 mg total) by mouth daily. Reported on 10/15/2015  Dispense: 90 tablet; Refill: 1 - simvastatin (ZOCOR) 80 MG tablet; TAKE 1 TABLET BY MOUTH EVERY DAY  Dispense: 90 tablet; Refill: 1  5. Dercum disease New diagnosis.  Patient will have multiple small lipomas that are painful generalized.  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  6. Multiple  thyroid nodules Patient at the near the end of the appointment brings up that she has had multiple thyroid nodules and goiter.  On review her previous physician did ultrasound which noted some goiter cysts as well as a couple nodules.  We will do an ultrasound of the thyroid to evaluate for progression. - Thyroid Panel With TSH - US THYROID; Future  7. Encounter for screening mammogram for malignant neoplasm of breast Discussed with patient and referral for bilateral tomograms placed. - MM DIAG BREAST TOMO BILATERAL; Future  8. Gastroesophageal reflux disease Chronic.  Controlled.  Stable.  Continue Nexium 40 mg once a day as previously been taking. - esomeprazole (NEXIUM) 40 MG capsule; TAKE ONE CAPSULE BY MOUTH EVERY DAY AS DIRECTED BY PROVIDER  Dispense: 90 capsule; Refill: 2

## 2019-12-08 LAB — THYROID PANEL WITH TSH
Free Thyroxine Index: 1.3 (ref 1.2–4.9)
T3 Uptake Ratio: 17 % — ABNORMAL LOW (ref 24–39)
T4, Total: 7.5 ug/dL (ref 4.5–12.0)
TSH: 1.69 u[IU]/mL (ref 0.450–4.500)

## 2019-12-10 ENCOUNTER — Telehealth: Payer: Self-pay

## 2019-12-10 NOTE — Telephone Encounter (Signed)
I have left 2 messages on different phone numbers as well as 2 messages on "Wiley's" phone to have pt call us to get her appt for her [place]. I have stressed on my message, the importance of her return call. I have also left the number in which she may call the hospital and they can give her the appt time in scheduling dept if unable to get through to [place].

## 2019-12-11 ENCOUNTER — Telehealth: Payer: Self-pay

## 2019-12-11 NOTE — Telephone Encounter (Signed)
The person that has "Jamie Kim's" phone number called and concerned why she keeps getting calls from this number. Apologized to  Jamie Kim, and told her we are sorry, that must have been an old number for that pt.  I have since removed the number from his profile, and told her she should not be getting anymore calls from [place]. She said her daughter gave her that phone a few months ago.

## 2019-12-11 NOTE — Telephone Encounter (Signed)
Noted and phone number removed

## 2019-12-12 ENCOUNTER — Ambulatory Visit: Payer: Medicare Other

## 2019-12-12 DIAGNOSIS — M47897 Other spondylosis, lumbosacral region: Secondary | ICD-10-CM | POA: Diagnosis not present

## 2019-12-12 DIAGNOSIS — M792 Neuralgia and neuritis, unspecified: Secondary | ICD-10-CM | POA: Diagnosis not present

## 2019-12-12 DIAGNOSIS — M545 Low back pain: Secondary | ICD-10-CM | POA: Diagnosis not present

## 2019-12-12 DIAGNOSIS — G894 Chronic pain syndrome: Secondary | ICD-10-CM | POA: Diagnosis not present

## 2019-12-12 DIAGNOSIS — G5602 Carpal tunnel syndrome, left upper limb: Secondary | ICD-10-CM | POA: Diagnosis not present

## 2019-12-12 DIAGNOSIS — M25559 Pain in unspecified hip: Secondary | ICD-10-CM | POA: Diagnosis not present

## 2019-12-12 DIAGNOSIS — G8929 Other chronic pain: Secondary | ICD-10-CM | POA: Diagnosis not present

## 2019-12-12 DIAGNOSIS — R519 Headache, unspecified: Secondary | ICD-10-CM | POA: Diagnosis not present

## 2019-12-12 DIAGNOSIS — G5601 Carpal tunnel syndrome, right upper limb: Secondary | ICD-10-CM | POA: Diagnosis not present

## 2019-12-12 DIAGNOSIS — M5030 Other cervical disc degeneration, unspecified cervical region: Secondary | ICD-10-CM | POA: Diagnosis not present

## 2019-12-14 NOTE — Telephone Encounter (Signed)
error 

## 2019-12-20 ENCOUNTER — Other Ambulatory Visit: Payer: Self-pay | Admitting: Family Medicine

## 2019-12-20 DIAGNOSIS — Z1231 Encounter for screening mammogram for malignant neoplasm of breast: Secondary | ICD-10-CM

## 2019-12-25 ENCOUNTER — Ambulatory Visit
Admission: RE | Admit: 2019-12-25 | Discharge: 2019-12-25 | Disposition: A | Discharge status: Home / Self Care | Payer: Medicare Other | Source: Ambulatory Visit | Attending: Family Medicine | Admitting: Family Medicine

## 2019-12-25 ENCOUNTER — Other Ambulatory Visit: Payer: Self-pay

## 2019-12-25 DIAGNOSIS — E042 Nontoxic multinodular goiter: Secondary | ICD-10-CM | POA: Diagnosis not present

## 2019-12-29 ENCOUNTER — Other Ambulatory Visit: Payer: Self-pay | Admitting: Family Medicine

## 2019-12-29 DIAGNOSIS — G43C0 Periodic headache syndromes in child or adult, not intractable: Secondary | ICD-10-CM

## 2019-12-29 NOTE — Telephone Encounter (Signed)
Requested medication (s) are due for refill today: yes  Requested medication (s) are on the active medication list: yes  Last refill:  12/04/19  Future visit scheduled: yes  Notes to clinic:  med not delegated to NT to Refill   Requested Prescriptions  Pending Prescriptions Disp Refills   topiramate (TOPAMAX) 50 MG tablet [Pharmacy Med Name: TOPIRAMATE 50 MG TABLET] 60 tablet 0    Sig: TAKE 1 TABLET BY MOUTH TWICE A DAY      Not Delegated - Neurology: Anticonvulsants - topiramate & zonisamide Failed - 12/29/2019 10:31 AM      Failed - This refill cannot be delegated      Passed - Cr in normal range and within 360 days    Creatinine  Date Value Ref Range Status  10/02/2012 1.05 0.60 - 1.30 mg/dL Final   Creatinine, Ser  Date Value Ref Range Status  05/04/2019 0.78 0.57 - 1.00 mg/dL Final          Passed - CO2 in normal range and within 360 days    CO2  Date Value Ref Range Status  05/04/2019 25 20 - 29 mmol/L Final   Co2  Date Value Ref Range Status  10/02/2012 22 21 - 32 mmol/L Final          Passed - Valid encounter within last 12 months    Recent Outpatient Visits           3 weeks ago Essential hypertension   [place], MD   7 months ago Chronic anxiety   [facility] Juline Patch, MD   1 year ago Essential hypertension   [place], Deanna C, MD   1 year ago Essential hypertension   [facility] Juline Patch, MD   2 years ago Fall, initial encounter   [place], [place], MD       Future Appointments             In 4 months Juline Patch, MD [facility], [facility]

## 2020-01-02 ENCOUNTER — Ambulatory Visit
Admission: RE | Admit: 2020-01-02 | Discharge: 2020-01-02 | Disposition: A | Discharge status: Home / Self Care | Payer: Medicare Other | Source: Ambulatory Visit | Attending: Family Medicine | Admitting: Family Medicine

## 2020-01-02 DIAGNOSIS — N6321 Unspecified lump in the left breast, upper outer quadrant: Secondary | ICD-10-CM | POA: Insufficient documentation

## 2020-01-02 DIAGNOSIS — N6311 Unspecified lump in the right breast, upper outer quadrant: Secondary | ICD-10-CM | POA: Diagnosis not present

## 2020-01-02 DIAGNOSIS — N644 Mastodynia: Secondary | ICD-10-CM | POA: Diagnosis not present

## 2020-01-02 DIAGNOSIS — Z1231 Encounter for screening mammogram for malignant neoplasm of breast: Secondary | ICD-10-CM

## 2020-01-02 DIAGNOSIS — R928 Other abnormal and inconclusive findings on diagnostic imaging of breast: Secondary | ICD-10-CM | POA: Diagnosis not present

## 2020-01-10 DIAGNOSIS — G894 Chronic pain syndrome: Secondary | ICD-10-CM | POA: Diagnosis not present

## 2020-01-14 ENCOUNTER — Telehealth: Payer: Self-pay | Admitting: Family Medicine

## 2020-01-14 DIAGNOSIS — M1990 Unspecified osteoarthritis, unspecified site: Secondary | ICD-10-CM | POA: Diagnosis not present

## 2020-01-14 DIAGNOSIS — M5030 Other cervical disc degeneration, unspecified cervical region: Secondary | ICD-10-CM | POA: Diagnosis not present

## 2020-01-14 DIAGNOSIS — G894 Chronic pain syndrome: Secondary | ICD-10-CM | POA: Diagnosis not present

## 2020-01-14 NOTE — Telephone Encounter (Signed)
Left message for patient to call back and schedule Medicare Annual Wellness Visit (AWV) either virtually/audio only or in office. Whichever the patients preference is.  No history of AWV; please schedule at anytime with [facility]-Nurse Health Advisor.  This should be a 40 minute visit OK TO BILL [id number] 

## 2020-02-01 ENCOUNTER — Other Ambulatory Visit: Payer: Self-pay | Admitting: Family Medicine

## 2020-02-01 DIAGNOSIS — F339 Major depressive disorder, recurrent, unspecified: Secondary | ICD-10-CM

## 2020-02-10 DIAGNOSIS — G894 Chronic pain syndrome: Secondary | ICD-10-CM | POA: Diagnosis not present

## 2020-02-27 DIAGNOSIS — M5030 Other cervical disc degeneration, unspecified cervical region: Secondary | ICD-10-CM | POA: Diagnosis not present

## 2020-02-27 DIAGNOSIS — M545 Low back pain: Secondary | ICD-10-CM | POA: Diagnosis not present

## 2020-02-27 DIAGNOSIS — G894 Chronic pain syndrome: Secondary | ICD-10-CM | POA: Diagnosis not present

## 2020-03-19 ENCOUNTER — Other Ambulatory Visit: Payer: Self-pay

## 2020-03-19 ENCOUNTER — Encounter: Payer: Self-pay | Admitting: Family Medicine

## 2020-03-19 ENCOUNTER — Ambulatory Visit (INDEPENDENT_AMBULATORY_CARE_PROVIDER_SITE_OTHER): Payer: Medicare Other | Admitting: Family Medicine

## 2020-03-19 VITALS — BP 130/94 | HR 80 | Ht 70.0 in | Wt 194.0 lb

## 2020-03-19 DIAGNOSIS — L739 Follicular disorder, unspecified: Secondary | ICD-10-CM | POA: Diagnosis not present

## 2020-03-19 DIAGNOSIS — L0100 Impetigo, unspecified: Secondary | ICD-10-CM

## 2020-03-19 DIAGNOSIS — Z23 Encounter for immunization: Secondary | ICD-10-CM

## 2020-03-19 MED ORDER — MUPIROCIN 2 % EX OINT: 1.0000 "application " | TOPICAL_OINTMENT | Freq: Two times a day (BID) | CUTANEOUS | 2 refills | Status: DC

## 2020-03-19 MED ORDER — DOXYCYCLINE HYCLATE 100 MG PO TABS: 100.0000 mg | ORAL_TABLET | Freq: Two times a day (BID) | ORAL | 1 refills | Status: DC

## 2020-03-19 NOTE — Patient Instructions (Signed)
Folliculitis  Folliculitis is inflammation of the hair follicles. Folliculitis most commonly occurs on the scalp, thighs, legs, back, and buttocks. However, it can occur anywhere on the body. What are the causes? This condition may be caused by:  A bacterial infection (common).  A fungal infection.  A viral infection.  Contact with certain chemicals, especially oils and tars.  Shaving or waxing.  Greasy ointments or creams applied to the skin. Long-lasting folliculitis and folliculitis that keeps coming back may be caused by bacteria. This bacteria can live anywhere on your skin and is often found in the nostrils. What increases the risk? You are more likely to develop this condition if you have:  A weakened immune system.  Diabetes.  Obesity. What are the signs or symptoms? Symptoms of this condition include:  Redness.  Soreness.  Swelling.  Itching.  Small white or yellow, pus-filled, itchy spots (pustules) that appear over a reddened area. If there is an infection that goes deep into the follicle, these may develop into a boil (furuncle).  A group of closely packed boils (carbuncle). These tend to form in hairy, sweaty areas of the body. How is this diagnosed? This condition is diagnosed with a skin exam. To find what is causing the condition, your health care provider may take a sample of one of the pustules or boils for testing in a lab. How is this treated? This condition may be treated by:  Applying warm compresses to the affected areas.  Taking an antibiotic medicine or applying an antibiotic medicine to the skin.  Applying or bathing with an antiseptic solution.  Taking an over-the-counter medicine to help with itching.  Having a procedure to drain any pustules or boils. This may be done if a pustule or boil contains a lot of pus or fluid.  Having laser hair removal. This may be done to treat long-lasting folliculitis. Follow these instructions at  home: Managing pain and swelling   If directed, apply heat to the affected area as often as told by your health care provider. Use the heat source that your health care provider recommends, such as a moist heat pack or a heating pad. ? Place a towel between your skin and the heat source. ? Leave the heat on for 20-30 minutes. ? Remove the heat if your skin turns bright red. This is especially important if you are unable to feel pain, heat, or cold. You may have a greater risk of getting burned. General instructions  If you were prescribed an antibiotic medicine, take it or apply it as told by your health care provider. Do not stop using the antibiotic even if your condition improves.  Check the irritated area every day for signs of infection. Check for: ? Redness, swelling, or pain. ? Fluid or blood. ? Warmth. ? Pus or a bad smell.  Do not shave irritated skin.  Take over-the-counter and prescription medicines only as told by your health care provider.  Keep all follow-up visits as told by your health care provider. This is important. Get help right away if:  You have more redness, swelling, or pain in the affected area.  Red streaks are spreading from the affected area.  You have a fever. Summary  Folliculitis is inflammation of the hair follicles. Folliculitis most commonly occurs on the scalp, thighs, legs, back, and buttocks.  This condition may be treated by taking an antibiotic medicine or applying an antibiotic medicine to the skin, and applying or bathing with an antiseptic   solution.  If you were prescribed an antibiotic medicine, take it or apply it as told by your health care provider. Do not stop using the antibiotic even if your condition improves.  Get help right away if you have new or worsening symptoms.  Keep all follow-up visits as told by your health care provider. This is important. This information is not intended to replace advice given to you by your  health care provider. Make sure you discuss any questions you have with your health care provider. Document Revised: 01/28/2018 Document Reviewed: 01/28/2018 Elsevier Patient Education  [place], Adult Impetigo is an infection of the skin. It commonly occurs in young children, but it can also occur in adults. The infection causes itchy blisters and sores that produce brownish-yellow fluid. As the fluid dries, it forms a thick, honey-colored crust. These skin changes usually occur on the face, but they can also affect other areas of the body. Impetigo usually goes away in 7-10 days with treatment. What are the causes? This condition is caused by two types of bacteria. It may be caused by staphylococci or streptococci bacteria. These bacteria cause impetigo when they get under the surface of the skin. This often happens after some damage to the skin, such as:  Cuts, scrapes, or scratches.  Rashes.  Insect bites, especially when you scratch the area of a bite.  Chickenpox or other illnesses that cause open skin sores.  Nail biting or chewing. Impetigo can spread easily from one person to another (is contagious). It may be spread through close skin contact or by sharing towels, clothing, or other items that an infected person has touched. What increases the risk? The following factors may make you more likely to develop this condition:  Playing sports that include skin-to-skin contact with others.  Having a skin condition with open sores, such as chickenpox.  Having diabetes.  Having a weak body defense system (immune system).  Having many skin cuts or scrapes.  Living in an area that has high humidity levels.  Having poor hygiene.  Having high levels of staphylococci in your nose. What are the signs or symptoms? The main symptom of this condition is small blisters, often on the face around the mouth and nose. In time, the blisters break open and turn into tiny  sores (lesions) with a yellow crust. In some cases, the blisters cause itching or burning. With scratching, irritation, or lack of treatment, these small lesions may get larger. Other possible symptoms include:  Larger blisters.  Pus.  Swollen lymph glands. Scratching the affected area can cause impetigo to spread to other parts of the body. The bacteria can get under the fingernails and spread when you touch another area of your skin. How is this diagnosed? This condition is usually diagnosed during a physical exam. A skin sample or a sample of fluid from a blister may be taken for lab tests that involve growing bacteria (culture test). Lab tests can help to confirm the diagnosis or help to determine the best treatment. How is this treated? Treatment for this condition depends on the severity of the condition:  Mild impetigo can be treated with prescription antibiotic cream.  Oral antibiotic medicine may be used in more severe cases.  Medicines that reduce itchiness (antihistamines)may also be used. Follow these instructions at home: Medicines  Take over-the-counter and prescription medicines only as told by your health care provider.  Apply or take your antibiotic as told by your health care provider.  Do not stop using the antibiotic even if your condition improves. General instructions   To help prevent impetigo from spreading to other body areas: ? Keep your fingernails short and clean. ? Do not scratch the blisters or sores. ? Cover infected areas, if necessary, to keep from scratching. ? Wash your hands often with soap and warm water.  Before applying antibiotic cream or ointment, you should: ? Gently wash the infected areas with antibacterial soap and warm water. ? Soak crusted areas in warm, soapy water using antibacterial soap. ? Gently rub the areas to remove crusts. Do not scrub.  Do not share towels.  Wash your clothing and bedsheets in warm water that is 140F  (60C) or warmer.  Stay home until you have used an antibiotic cream for 48 hours (2 days) or an oral antibiotic medicine for 24 hours (1 day). You should only return to work and activities with other people if your skin shows significant improvement. ? You may return to contact sports after you have used antibiotic medicine for 72 hours (3 days).  Keep all follow-up visits as told by your health care provider. This is important. How is this prevented?  Wash your hands often with soap and warm water.  Do not share towels, washcloths, clothing, bedding, or razors.  Keep your fingernails short.  Keep any cuts, scrapes, bug bites, or rashes clean and covered.  Use insect repellent to prevent bug bites. Contact a health care provider if:  You develop more blisters or sores even with treatment.  Other family members get sores.  Your skin sores are not improving after 72 hours (3 days) of treatment.  You have a fever. Get help right away if:  You see spreading redness or swelling of the skin around your sores.  You see red streaks coming from your sores.  You develop a sore throat.  The area around your rash becomes warm, red, or tender to the touch.  You have dark, reddish-brown urine.  You do not urinate often or you urinate small amounts.  You are very tired (lethargic).  You have swelling in the face, hands, or feet. Summary  Impetigo is a skin infection that causes itchy blisters and sores that produce brownish-yellow fluid. As the fluid dries, it forms a crust.  This condition is caused by staphylococci or streptococci bacteria. These bacteria cause impetigo when they get under the surface of the skin, such as through cuts, rashes, bug bites, or open sores.  Treatment for this condition may include antibiotic ointment or oral antibiotics.  To help prevent impetigo from spreading to other body areas, make sure you keep your fingernails short, avoid scratching, cover  any blisters, and wash your hands often.  If you have impetigo, stay home until you have used an antibiotic cream for 48 hours (2 days) or an oral antibiotic medicine for 24 hours (1 day). You should only return to work and activities with other people if your skin shows significant improvement. This information is not intended to replace advice given to you by your health care provider. Make sure you discuss any questions you have with your health care provider. Document Revised: 08/01/2018 Document Reviewed: 07/13/2016 Elsevier Patient Education  2020 [employer].

## 2020-03-19 NOTE — Progress Notes (Signed)
Date:  03/19/2020   Name:  Jamie Kim   DOB:  1961/02/06   MRN:  [id number]   Chief Complaint: knots on head (under scalp that are "draining oil at night" The bumps hurt and leave matted places in hair in the am- been there for a couple of months), place on face (has a place beside mouth- gets places on face that scar face when they clear up), and Flu Vaccine  Rash This is a recurrent problem. The current episode started more than 1 month ago. The problem has been waxing and waning since onset. The affected locations include the head, scalp and face. The rash is characterized by itchiness, redness and scaling. Pertinent negatives include no congestion, cough, diarrhea, eye pain, fatigue, fever, rhinorrhea, shortness of breath, sore throat or vomiting. Past treatments include nothing.    Lab Results  Component Value Date   CREATININE 0.78 05/04/2019   BUN 11 05/04/2019   NA 141 05/04/2019   K 4.7 05/04/2019   CL 104 05/04/2019   CO2 25 05/04/2019   Lab Results  Component Value Date   CHOL 160 05/04/2019   HDL 57 05/04/2019   LDLCALC 68 05/04/2019   TRIG 212 (H) 05/04/2019   CHOLHDL 2.5 11/02/2018   Lab Results  Component Value Date   TSH 1.690 12/07/2019   No results found for: HGBA1C Lab Results  Component Value Date   WBC 4.9 11/20/2012   HGB 12.2 11/20/2012   HCT 36.0 11/20/2012   MCV 91 11/20/2012   PLT 396 (H) 11/20/2012   Lab Results  Component Value Date   ALT 13 11/02/2018   AST 17 11/02/2018   ALKPHOS 68 11/02/2018   BILITOT <0.2 11/02/2018     Review of Systems  Constitutional: Negative.  Negative for chills, fatigue, fever and unexpected weight change.  HENT: Negative for congestion, ear discharge, ear pain, rhinorrhea, sinus pressure, sneezing and sore throat.   Eyes: Negative for photophobia, pain, discharge, redness and itching.  Respiratory: Negative for cough, shortness of breath, wheezing and stridor.   Gastrointestinal: Negative for  abdominal pain, blood in stool, constipation, diarrhea, nausea and vomiting.  Endocrine: Negative for cold intolerance, heat intolerance, polydipsia, polyphagia and polyuria.  Genitourinary: Negative for dysuria, flank pain, frequency, hematuria, menstrual problem, pelvic pain, urgency, vaginal bleeding and vaginal discharge.  Musculoskeletal: Negative for arthralgias, back pain and myalgias.  Skin: Positive for rash.  Allergic/Immunologic: Negative for environmental allergies and food allergies.  Neurological: Negative for dizziness, weakness, light-headedness, numbness and headaches.  Hematological: Negative for adenopathy. Does not bruise/bleed easily.  Psychiatric/Behavioral: Negative for dysphoric mood. The patient is not nervous/anxious.     Patient Active Problem List   Diagnosis Date Noted  . 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 Family history of malignant neoplasm of gastrointestinal tract   . Benign neoplasm of ascending colon   . Benign neoplasm of transverse colon   . Polyp of sigmoid colon   . Gastric polyp   . Status post Nissen fundoplication (without gastrostomy tube) procedure   . Esophageal dysphagia   . Heartburn   . Essential hypertension 09/03/2016  . Periodic headache syndrome, not intractable 09/03/2016  . Gastroesophageal reflux disease 09/03/2016  . Multiple thyroid nodules 09/03/2016  . Vitamin D deficiency 09/03/2016  . Chronic anxiety 09/03/2016  . Depression, recurrent ([place]) 09/03/2016  . DDD (degenerative disc disease), lumbar 02/26/2015  . Bilateral occipital neuralgia 01/27/2015  . Spinal stenosis of lumbar region 11/25/2014  . DDD (degenerative  disc disease), cervical 11/24/2014  . Status post cervical spinal fusion 11/24/2014  . Lumbosacral facet joint syndrome 11/24/2014  . Sacroiliac joint dysfunction of both sides 11/24/2014  . Greater trochanteric bursitis of both hips 11/24/2014  . Cervical facet syndrome 11/24/2014  . Hyperlipidemia   . Chest  pain 10/26/2012  . Palpitations 10/26/2012    Allergies  Allergen Reactions  . Codeine Itching  . Hydrocodone Itching  . Morphine Itching  . Morphine And Related   . Oxybutynin Itching  . Oxycodone Itching  . Tramadol-Acetaminophen Itching    Past Surgical History:  Procedure Laterality Date  . ANTERIOR CERVICAL DECOMP/DISCECTOMY FUSION    . BLADDER SUSPENSION    . CARDIAC CATHETERIZATION  11/23/12   [facility];EF 65%  . CARPAL TUNNEL RELEASE Bilateral   . CHOLECYSTECTOMY    . COLONOSCOPY WITH PROPOFOL N/A 06/13/2018   Procedure: COLONOSCOPY WITH BIOPSIES;  Surgeon: Virgel Manifold, MD;  Location: [facility];  Service: Endoscopy;  Laterality: N/A;  . ESOPHAGOGASTRODUODENOSCOPY (EGD) WITH PROPOFOL N/A 06/13/2018   Procedure: ESOPHAGOGASTRODUODENOSCOPY (EGD) WITH BIOPSIES;  Surgeon: Virgel Manifold, MD;  Location: [facility];  Service: Endoscopy;  Laterality: N/A;  . HERNIA REPAIR    . KNEE SURGERY Left   . LAPAROSCOPY    . NECK SURGERY    . NISSEN FUNDOPLICATION    . POLYPECTOMY N/A 06/13/2018   Procedure: POLYPECTOMY;  Surgeon: Virgel Manifold, MD;  Location: [facility];  Service: Endoscopy;  Laterality: N/A;  . TONSILLECTOMY    . TOTAL ABDOMINAL HYSTERECTOMY      Social History   Tobacco Use  . Smoking status: Former Smoker    Packs/day: 0.50    Years: 25.00    Pack years: 12.50    Types: Cigarettes    Quit date: 2018    Years since quitting: 3.7  . Smokeless tobacco: Never Used  Vaping Use  . Vaping Use: Never used  Substance Use Topics  . Alcohol use: No    Alcohol/week: 0.0 standard drinks    Comment: 1 month  . Drug use: No     Medication list has been reviewed and updated.  Current Meds  Medication Sig  . acetaminophen (TYLENOL) 325 MG tablet Take 650 mg by mouth every 6 (six) hours as needed.  Marland Kitchen buPROPion (WELLBUTRIN XL) 150 MG 24 hr tablet TAKE 1 TABLET BY MOUTH EVERY DAY IN THE MORNING  . busPIRone (BUSPAR)  30 MG tablet Take 1 tablet (30 mg total) by mouth 2 (two) times daily.  . DULoxetine (CYMBALTA) 60 MG capsule Take 1-2 mg by mouth daily. Dr Primus Bravo  . esomeprazole (NEXIUM) 40 MG capsule TAKE ONE CAPSULE BY MOUTH EVERY DAY AS DIRECTED BY PROVIDER  . ezetimibe (ZETIA) 10 MG tablet Take 1 tablet (10 mg total) by mouth daily. Reported on 10/15/2015  . gabapentin (NEURONTIN) 600 MG tablet Take 600 mg by mouth 2 (two) times daily. Dr Primus Bravo  . methadone (DOLOPHINE) 5 MG tablet Limit 2-3 tabs by mouth per day if tolerated (Patient taking differently: Take 5 mg by mouth. Limit 2-3 tabs by mouth per day if tolerated- Dr Primus Bravo)  . metoprolol succinate (TOPROL-XL) 100 MG 24 hr tablet Take 1 tablet (100 mg total) by mouth daily. Take with or immediately following a meal.  . Multiple Vitamin (MULTI-VITAMINS) TABS Take by mouth.  . oxybutynin (DITROPAN-XL) 5 MG 24 hr tablet TAKE 1 TABLET BY MOUTH EVERYDAY AT BEDTIME  . simvastatin (ZOCOR) 80 MG tablet TAKE  1 TABLET BY MOUTH EVERY DAY  . tiZANidine (ZANAFLEX) 2 MG tablet Limit  2-4 tablets by mouth per day tolerated (Patient taking differently: Take 2 mg by mouth once. Limit  2-4 tablets by mouth per day tolerated/ Dr Primus Bravo)  . topiramate (TOPAMAX) 50 MG tablet TAKE 1 TABLET BY MOUTH TWICE A DAY  . Vitamin D, Ergocalciferol, (DRISDOL) 1.25 MG (50000 UT) CAPS capsule TAKE 1 CAPSULE BY MOUTH ONE TIME PER WEEK   Current Facility-Administered Medications for the 03/19/20 encounter (Office Visit) with Juline Patch, MD  Medication  . bupivacaine (MARCAINE) 0.5 % injection 30 mL  . bupivacaine (PF) (MARCAINE) 0.25 % injection 30 mL  . bupivacaine (PF) (MARCAINE) 0.25 % injection 30 mL  . fentaNYL (SUBLIMAZE) injection 100 mcg  . fentaNYL (SUBLIMAZE) injection 100 mcg  . fentaNYL (SUBLIMAZE) injection 100 mcg  . lactated ringers infusion 1,000 mL  . lactated ringers infusion 1,000 mL  . lactated ringers infusion 1,000 mL  . lactated ringers infusion 1,000 mL  .  lidocaine (PF) (XYLOCAINE) 1 % injection 10 mL  . midazolam (VERSED) 5 MG/5ML injection 5 mg  . midazolam (VERSED) 5 MG/5ML injection 5 mg  . midazolam (VERSED) 5 MG/5ML injection 5 mg  . orphenadrine (NORFLEX) injection 60 mg  . orphenadrine (NORFLEX) injection 60 mg  . orphenadrine (NORFLEX) injection 60 mg  . sodium chloride 0.9 % injection 20 mL  . triamcinolone acetonide (KENALOG-40) injection 40 mg  . triamcinolone acetonide (KENALOG-40) injection 40 mg  . triamcinolone acetonide (KENALOG-40) injection 40 mg    PHQ 2/9 Scores 03/19/2020 12/07/2019 05/04/2019 11/02/2018  PHQ - 2 Score 0 0 0 0  PHQ- 9 Score 0 0 0 0  Exception Documentation - - - -    GAD 7 : Generalized Anxiety Score 03/19/2020 12/07/2019 05/04/2019  Nervous, Anxious, on Edge 0 0 0  Control/stop worrying 0 0 0  Worry too much - different things 0 0 0  Trouble relaxing 0 0 0  Restless 0 0 0  Easily annoyed or irritable 0 0 0  Afraid - awful might happen 0 0 0  Total GAD 7 Score 0 0 0  Anxiety Difficulty - Not difficult at all -    BP Readings from Last 3 Encounters:  03/19/20 (!) 130/94  12/07/19 124/78  05/04/19 130/90    Physical Exam Vitals and nursing note reviewed.  Constitutional:      Appearance: She is well-developed.  HENT:     Head: Normocephalic.     Right Ear: Tympanic membrane, ear canal and external ear normal.     Left Ear: Tympanic membrane, ear canal and external ear normal.  Eyes:     General: Lids are everted, no foreign bodies appreciated. No scleral icterus.       Left eye: No foreign body or hordeolum.     Conjunctiva/sclera: Conjunctivae normal.     Right eye: Right conjunctiva is not injected.     Left eye: Left conjunctiva is not injected.     Pupils: Pupils are equal, round, and reactive to light.  Neck:     Thyroid: No thyromegaly.     Vascular: No JVD.     Trachea: No tracheal deviation.  Cardiovascular:     Rate and Rhythm: Normal rate and regular rhythm.     Heart  sounds: Normal heart sounds. No murmur heard.  No friction rub. No gallop.   Pulmonary:     Effort: Pulmonary effort is normal. No respiratory  distress.     Breath sounds: Normal breath sounds. No wheezing or rales.  Abdominal:     General: Bowel sounds are normal.     Palpations: Abdomen is soft. There is no mass.     Tenderness: There is no abdominal tenderness. There is no guarding or rebound.  Musculoskeletal:        General: No tenderness. Normal range of motion.     Cervical back: Normal range of motion and neck supple.  Lymphadenopathy:     Cervical: No cervical adenopathy.  Skin:    General: Skin is warm.     Findings: Rash present. Rash is crusting and macular.  Neurological:     Mental Status: She is alert and oriented to person, place, and time.     Cranial Nerves: No cranial nerve deficit.     Deep Tendon Reflexes: Reflexes normal.  Psychiatric:        Mood and Affect: Mood is not anxious or depressed.     Wt Readings from Last 3 Encounters:  03/19/20 194 lb (88 kg)  12/07/19 198 lb (89.8 kg)  05/04/19 209 lb (94.8 kg)    BP (!) 130/94   Pulse 80   Ht 5\' 10"  (1.778 m)   Wt 194 lb (88 kg)   BMI 27.84 kg/m   Assessment and Plan: 1. Folliculitis New onset.  Persistent.  Patient has multiple areas in the scalp region that are honey crusted with some drainage.  Patient has tried washing hair to try to keep them clean but this is been unsuccessful.  As noted above we will initiate Bactroban along with doxycycline 100 mg twice a day to control the acute circumstance.  2. Impetigo Chronic.  Recurrent.  Persistent.  Area at the right facial area has been present for several weeks unresolving.  Patient has had a history of acne that she has seemed dermatology/[facility] [place] in the past.  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  We will treat with doxycycline 100 mg  twice a day for 10 days followed by once a day thereafter until evaluation by dermatology.  Patient is also been given Bactroban to apply to the areas twice a day after cleansing. - doxycycline (VIBRA-TABS) 100 MG tablet; Take 1 tablet (100 mg total) by mouth 2 (two) times daily. One twice a day for ten days the one a day  Dispense: 60 tablet; Refill: 1 - mupirocin ointment (BACTROBAN) 2 %; Apply 1 application topically 2 (two) times daily.  Dispense: 22 g; Refill: 2 - Ambulatory referral to Dermatology  3. Need for immunization against influenza Discussed and administered - Flu Vaccine QUAD 36+ mos IM

## 2020-03-21 ENCOUNTER — Telehealth: Payer: Self-pay

## 2020-03-21 NOTE — Telephone Encounter (Signed)
I called [facility] and left a vm in response to the fax we received. On the vm I stated Doxycycline 100mg  1 twice a day for 10 days and then 1 a day the remainder of that RX. On the refill it is suppose to be Doxycycline 100mg  1 a day for 30 days. I also stated she is to see dermatology. I left NPI and telephone number in case of questions

## 2020-03-30 ENCOUNTER — Other Ambulatory Visit: Payer: Self-pay | Admitting: Family Medicine

## 2020-03-30 DIAGNOSIS — E559 Vitamin D deficiency, unspecified: Secondary | ICD-10-CM

## 2020-03-30 NOTE — Telephone Encounter (Signed)
Requested medication (s) are due for refill today: yes  Requested medication (s) are on the active medication list: yes  Last refill:  05/04/19  Future visit scheduled: yes  Notes to clinic:  med not delegated to NT to refill   Requested Prescriptions  Pending Prescriptions Disp Refills   Vitamin D, Ergocalciferol, (DRISDOL) 1.25 MG (50000 UNIT) CAPS capsule [Pharmacy Med Name: VITAMIN D2 1.25MG (50,000 UNIT)] 12 capsule 1    Sig: TAKE 1 CAPSULE BY MOUTH ONE TIME PER WEEK      Endocrinology:  Vitamins - Vitamin D Supplementation Failed - 03/30/2020  8:52 AM      Failed - 50,000 IU strengths are not delegated      Failed - Phosphate in normal range and within 360 days    Phosphorus  Date Value Ref Range Status  05/04/2019 4.8 (H) 3.0 - 4.3 mg/dL Final          Failed - Vitamin D in normal range and within 360 days    No results found for: [id number], [id number], [id number], [place], [place], 25OHVITD3, 25OHVITD2, 25OHVITD1, 25OHVITD2, 25OHVITD3, VD25OH        Passed - Ca in normal range and within 360 days    Calcium  Date Value Ref Range Status  05/04/2019 9.7 8.7 - 10.2 mg/dL Final   Calcium, Total  Date Value Ref Range Status  10/02/2012 9.6 8.5 - 10.1 mg/dL Final          Passed - Valid encounter within last 12 months    Recent Outpatient Visits           1 week ago [place], MD   3 months ago Essential hypertension   [place], MD   11 months ago Chronic anxiety   [facility] Juline Patch, MD   1 year ago Essential hypertension   [place], Deanna C, MD   1 year ago Essential hypertension   [place], Deanna C, MD       Future Appointments             In 1 month Juline Patch, MD [facility], [facility]

## 2020-04-02 DIAGNOSIS — M25559 Pain in unspecified hip: Secondary | ICD-10-CM | POA: Diagnosis not present

## 2020-04-02 DIAGNOSIS — M792 Neuralgia and neuritis, unspecified: Secondary | ICD-10-CM | POA: Diagnosis not present

## 2020-04-02 DIAGNOSIS — M48062 Spinal stenosis, lumbar region with neurogenic claudication: Secondary | ICD-10-CM | POA: Diagnosis not present

## 2020-04-02 DIAGNOSIS — M1990 Unspecified osteoarthritis, unspecified site: Secondary | ICD-10-CM | POA: Diagnosis not present

## 2020-04-02 DIAGNOSIS — G894 Chronic pain syndrome: Secondary | ICD-10-CM | POA: Diagnosis not present

## 2020-04-02 DIAGNOSIS — M545 Low back pain: Secondary | ICD-10-CM | POA: Diagnosis not present

## 2020-04-02 DIAGNOSIS — G8929 Other chronic pain: Secondary | ICD-10-CM | POA: Diagnosis not present

## 2020-04-02 DIAGNOSIS — M5030 Other cervical disc degeneration, unspecified cervical region: Secondary | ICD-10-CM | POA: Diagnosis not present

## 2020-04-02 DIAGNOSIS — R519 Headache, unspecified: Secondary | ICD-10-CM | POA: Diagnosis not present

## 2020-04-02 DIAGNOSIS — M47897 Other spondylosis, lumbosacral region: Secondary | ICD-10-CM | POA: Diagnosis not present

## 2020-04-06 ENCOUNTER — Other Ambulatory Visit: Payer: Self-pay | Admitting: Family Medicine

## 2020-04-06 DIAGNOSIS — G43C0 Periodic headache syndromes in child or adult, not intractable: Secondary | ICD-10-CM

## 2020-04-06 NOTE — Telephone Encounter (Signed)
Requested medication (s) are due for refill today: yes  Requested medication (s) are on the active medication list: yes  Last refill:  12/11/19  Future visit scheduled: yes  Notes to clinic:  med not delegated to NT to RF   Requested Prescriptions  Pending Prescriptions Disp Refills   topiramate (TOPAMAX) 50 MG tablet [Pharmacy Med Name: TOPIRAMATE 50 MG TABLET] 180 tablet 1    Sig: TAKE 1 TABLET BY MOUTH TWICE A DAY      Not Delegated - Neurology: Anticonvulsants - topiramate & zonisamide Failed - 04/06/2020  8:07 AM      Failed - This refill cannot be delegated      Passed - Cr in normal range and within 360 days    Creatinine  Date Value Ref Range Status  10/02/2012 1.05 0.60 - 1.30 mg/dL Final   Creatinine, Ser  Date Value Ref Range Status  05/04/2019 0.78 0.57 - 1.00 mg/dL Final          Passed - CO2 in normal range and within 360 days    CO2  Date Value Ref Range Status  05/04/2019 25 20 - 29 mmol/L Final   Co2  Date Value Ref Range Status  10/02/2012 22 21 - 32 mmol/L Final          Passed - Valid encounter within last 12 months    Recent Outpatient Visits           2 weeks ago [place], Deanna C, MD   4 months ago Essential hypertension   [facility] Juline Patch, MD   11 months ago Chronic anxiety   [facility] Juline Patch, MD   1 year ago Essential hypertension   [place], Deanna C, MD   1 year ago Essential hypertension   [place], Deanna C, MD       Future Appointments             In 1 month Juline Patch, MD [facility], [facility]

## 2020-04-10 DIAGNOSIS — G894 Chronic pain syndrome: Secondary | ICD-10-CM | POA: Diagnosis not present

## 2020-04-30 DIAGNOSIS — M1990 Unspecified osteoarthritis, unspecified site: Secondary | ICD-10-CM | POA: Diagnosis not present

## 2020-04-30 DIAGNOSIS — M5030 Other cervical disc degeneration, unspecified cervical region: Secondary | ICD-10-CM | POA: Diagnosis not present

## 2020-04-30 DIAGNOSIS — G894 Chronic pain syndrome: Secondary | ICD-10-CM | POA: Diagnosis not present

## 2020-05-10 DIAGNOSIS — G894 Chronic pain syndrome: Secondary | ICD-10-CM | POA: Diagnosis not present

## 2020-05-17 ENCOUNTER — Other Ambulatory Visit: Payer: Self-pay | Admitting: Family Medicine

## 2020-05-17 DIAGNOSIS — G43C0 Periodic headache syndromes in child or adult, not intractable: Secondary | ICD-10-CM

## 2020-05-17 NOTE — Telephone Encounter (Signed)
Requested medication (s) are due for refill today: yes  Requested medication (s) are on the active medication list: yes  Last refill:  12/31/19  Future visit scheduled: yes  Notes to clinic:  med not delegated to NT to RF   Requested Prescriptions  Pending Prescriptions Disp Refills   topiramate (TOPAMAX) 50 MG tablet [Pharmacy Med Name: TOPIRAMATE 50 MG TABLET] 180 tablet 1    Sig: TAKE 1 TABLET BY MOUTH TWICE A DAY      Not Delegated - Neurology: Anticonvulsants - topiramate & zonisamide Failed - 05/17/2020  9:04 AM      Failed - This refill cannot be delegated      Failed - Cr in normal range and within 360 days    Creatinine  Date Value Ref Range Status  10/02/2012 1.05 0.60 - 1.30 mg/dL Final   Creatinine, Ser  Date Value Ref Range Status  05/04/2019 0.78 0.57 - 1.00 mg/dL Final          Failed - CO2 in normal range and within 360 days    CO2  Date Value Ref Range Status  05/04/2019 25 20 - 29 mmol/L Final   Co2  Date Value Ref Range Status  10/02/2012 22 21 - 32 mmol/L Final          Passed - Valid encounter within last 12 months    Recent Outpatient Visits           1 month ago [place], MD   5 months ago Essential hypertension   [place], Deanna C, MD   1 year ago Chronic anxiety   [facility] Juline Patch, MD   1 year ago Essential hypertension   [place], Deanna C, MD   2 years ago Essential hypertension   [place], Deanna C, MD       Future Appointments             In 3 days Juline Patch, MD [facility], [facility]

## 2020-05-18 ENCOUNTER — Other Ambulatory Visit: Payer: Self-pay | Admitting: Family Medicine

## 2020-05-18 DIAGNOSIS — E559 Vitamin D deficiency, unspecified: Secondary | ICD-10-CM

## 2020-05-19 NOTE — Telephone Encounter (Signed)
Requested medication (s) are due for refill today: Yes  Requested medication (s) are on the active medication list: Yes  Last refill:  05/04/19  Future visit scheduled: Yes  Notes to clinic:  Prescription has expired.    Requested Prescriptions  Pending Prescriptions Disp Refills   Vitamin D, Ergocalciferol, (DRISDOL) 1.25 MG (50000 UNIT) CAPS capsule [Pharmacy Med Name: VITAMIN D2 1.25MG (50,000 UNIT)] 12 capsule 1    Sig: TAKE 1 CAPSULE BY MOUTH ONE TIME PER WEEK      Endocrinology:  Vitamins - Vitamin D Supplementation Failed - 05/18/2020  7:43 PM      Failed - 50,000 IU strengths are not delegated      Failed - Ca in normal range and within 360 days    Calcium  Date Value Ref Range Status  05/04/2019 9.7 8.7 - 10.2 mg/dL Final   Calcium, Total  Date Value Ref Range Status  10/02/2012 9.6 8.5 - 10.1 mg/dL Final          Failed - Phosphate in normal range and within 360 days    Phosphorus  Date Value Ref Range Status  05/04/2019 4.8 (H) 3.0 - 4.3 mg/dL Final          Failed - Vitamin D in normal range and within 360 days    No results found for: [id number], [id number], [id number], 25OHVITD3, [place], [place], 25OHVITD2, 25OHVITD1, 25OHVITD2, 25OHVITD3, VD25OH        Passed - Valid encounter within last 12 months    Recent Outpatient Visits           2 months ago [place], Deanna C, MD   5 months ago Essential hypertension   [place], Deanna C, MD   1 year ago Chronic anxiety   [facility] Juline Patch, MD   1 year ago Essential hypertension   [place], Deanna C, MD   2 years ago Essential hypertension   [place], [place], MD       Future Appointments             Tomorrow Juline Patch, MD [facility], [facility]

## 2020-05-20 ENCOUNTER — Ambulatory Visit: Payer: Medicare Other | Admitting: Family Medicine

## 2020-05-28 ENCOUNTER — Other Ambulatory Visit: Payer: Self-pay | Admitting: Family Medicine

## 2020-05-28 DIAGNOSIS — G43C0 Periodic headache syndromes in child or adult, not intractable: Secondary | ICD-10-CM

## 2020-06-01 ENCOUNTER — Other Ambulatory Visit: Payer: Self-pay | Admitting: Family Medicine

## 2020-06-01 DIAGNOSIS — F419 Anxiety disorder, unspecified: Secondary | ICD-10-CM

## 2020-06-01 NOTE — Telephone Encounter (Signed)
Requested Prescriptions  Pending Prescriptions Disp Refills  . busPIRone (BUSPAR) 30 MG tablet [Pharmacy Med Name: BUSPIRONE HCL 30 MG TABLET] 180 tablet 0    Sig: TAKE 1 TABLET (30 MG TOTAL) BY MOUTH 2 (TWO) TIMES DAILY.     Psychiatry: Anxiolytics/Hypnotics - Non-controlled Passed - 06/01/2020  8:52 AM      Passed - Valid encounter within last 6 months    Recent Outpatient Visits          2 months ago [place], MD   5 months ago Essential hypertension   [place], Deanna C, MD   1 year ago Chronic anxiety   [facility] Juline Patch, MD   1 year ago Essential hypertension   [place], Deanna C, MD   2 years ago Essential hypertension   [place], Deanna C, MD      Future Appointments            In 1 month Juline Patch, MD [facility], [facility]

## 2020-06-09 DIAGNOSIS — G894 Chronic pain syndrome: Secondary | ICD-10-CM | POA: Diagnosis not present

## 2020-06-12 ENCOUNTER — Other Ambulatory Visit: Payer: Self-pay | Admitting: Family Medicine

## 2020-06-12 DIAGNOSIS — I10 Essential (primary) hypertension: Secondary | ICD-10-CM

## 2020-06-16 DIAGNOSIS — G894 Chronic pain syndrome: Secondary | ICD-10-CM | POA: Diagnosis not present

## 2020-06-16 DIAGNOSIS — M1990 Unspecified osteoarthritis, unspecified site: Secondary | ICD-10-CM | POA: Diagnosis not present

## 2020-06-16 DIAGNOSIS — M4322 Fusion of spine, cervical region: Secondary | ICD-10-CM | POA: Diagnosis not present

## 2020-06-16 DIAGNOSIS — R6889 Other general symptoms and signs: Secondary | ICD-10-CM | POA: Diagnosis not present

## 2020-06-19 ENCOUNTER — Other Ambulatory Visit: Payer: Self-pay | Admitting: Family Medicine

## 2020-06-19 DIAGNOSIS — L0100 Impetigo, unspecified: Secondary | ICD-10-CM

## 2020-07-07 ENCOUNTER — Ambulatory Visit (INDEPENDENT_AMBULATORY_CARE_PROVIDER_SITE_OTHER): Payer: Medicare Other | Admitting: Family Medicine

## 2020-07-07 ENCOUNTER — Other Ambulatory Visit: Payer: Self-pay

## 2020-07-07 ENCOUNTER — Encounter: Payer: Self-pay | Admitting: Family Medicine

## 2020-07-07 DIAGNOSIS — K219 Gastro-esophageal reflux disease without esophagitis: Secondary | ICD-10-CM

## 2020-07-07 DIAGNOSIS — G43C0 Periodic headache syndromes in child or adult, not intractable: Secondary | ICD-10-CM

## 2020-07-07 DIAGNOSIS — I10 Essential (primary) hypertension: Secondary | ICD-10-CM

## 2020-07-07 DIAGNOSIS — F339 Major depressive disorder, recurrent, unspecified: Secondary | ICD-10-CM

## 2020-07-07 DIAGNOSIS — E782 Mixed hyperlipidemia: Secondary | ICD-10-CM | POA: Diagnosis not present

## 2020-07-07 DIAGNOSIS — R6889 Other general symptoms and signs: Secondary | ICD-10-CM | POA: Diagnosis not present

## 2020-07-07 DIAGNOSIS — F419 Anxiety disorder, unspecified: Secondary | ICD-10-CM | POA: Diagnosis not present

## 2020-07-07 MED ORDER — BUSPIRONE HCL 30 MG PO TABS: 30.0000 mg | ORAL_TABLET | Freq: Two times a day (BID) | ORAL | 1 refills | Status: DC

## 2020-07-07 MED ORDER — TOPIRAMATE 50 MG PO TABS: 50.0000 mg | ORAL_TABLET | Freq: Two times a day (BID) | ORAL | 5 refills | Status: DC

## 2020-07-07 MED ORDER — SIMVASTATIN 80 MG PO TABS: ORAL_TABLET | ORAL | 1 refills | Status: DC

## 2020-07-07 MED ORDER — METOPROLOL SUCCINATE ER 100 MG PO TB24: 100.0000 mg | ORAL_TABLET | Freq: Every day | ORAL | 0 refills | Status: DC

## 2020-07-07 MED ORDER — BUPROPION HCL ER (XL) 150 MG PO TB24: ORAL_TABLET | ORAL | 1 refills | Status: DC

## 2020-07-07 MED ORDER — EZETIMIBE 10 MG PO TABS: 10.0000 mg | ORAL_TABLET | Freq: Every day | ORAL | 1 refills | Status: DC

## 2020-07-07 NOTE — Progress Notes (Signed)
Date:  07/07/2020   Name:  Jamie Kim   DOB:  1961/05/22   MRN:  [id number]   Chief Complaint: Hypertension, Hyperlipidemia, Depression, Urinary Incontinence (Oxybutinin is not helping much anymore), and Headache  Hypertension This is a chronic problem. The current episode started more than 1 year ago. The problem has been waxing and waning since onset. The problem is uncontrolled. Associated symptoms include headaches. Pertinent negatives include no anxiety, blurred vision, chest pain, malaise/fatigue, neck pain, orthopnea, palpitations, peripheral edema, PND, shortness of breath or sweats. There are no associated agents to hypertension. Risk factors for coronary artery disease include dyslipidemia. Past treatments include beta blockers. The current treatment provides moderate improvement. There are no compliance problems.  There is no history of angina, kidney disease, CAD/MI, CVA, heart failure, left ventricular hypertrophy, PVD or retinopathy. There is no history of chronic renal disease, a hypertension causing med or renovascular disease.  Hyperlipidemia This is a chronic problem. The current episode started more than 1 year ago. The problem is controlled. She has no history of chronic renal disease. Pertinent negatives include no chest pain, myalgias or shortness of breath. Current antihyperlipidemic treatment includes statins. The current treatment provides moderate improvement of lipids. There are no compliance problems.   Depression        This is a chronic problem.  The current episode started more than 1 year ago.   The problem occurs intermittently.  Associated symptoms include headaches.  Associated symptoms include no decreased concentration, no fatigue, no helplessness, no hopelessness, does not have insomnia, not irritable, no restlessness, no decreased interest, no appetite change, no body aches, no myalgias, no indigestion, not sad and no suicidal ideas.     The symptoms are  aggravated by nothing.  Past treatments include other medications.  Compliance with treatment is good.   Pertinent negatives include no anxiety. Headache  Pertinent negatives include no abdominal pain, back pain, blurred vision, coughing, dizziness, ear pain, eye pain, eye redness, fever, insomnia, nausea, neck pain, numbness, photophobia, rhinorrhea, sinus pressure, sore throat, vomiting or weakness. Her past medical history is significant for hypertension.    Lab Results  Component Value Date   CREATININE 0.78 05/04/2019   BUN 11 05/04/2019   NA 141 05/04/2019   K 4.7 05/04/2019   CL 104 05/04/2019   CO2 25 05/04/2019   Lab Results  Component Value Date   CHOL 160 05/04/2019   HDL 57 05/04/2019   LDLCALC 68 05/04/2019   TRIG 212 (H) 05/04/2019   CHOLHDL 2.5 11/02/2018   Lab Results  Component Value Date   TSH 1.690 12/07/2019   No results found for: HGBA1C Lab Results  Component Value Date   WBC 4.9 11/20/2012   HGB 12.2 11/20/2012   HCT 36.0 11/20/2012   MCV 91 11/20/2012   PLT 396 (H) 11/20/2012   Lab Results  Component Value Date   ALT 13 11/02/2018   AST 17 11/02/2018   ALKPHOS 68 11/02/2018   BILITOT <0.2 11/02/2018     Review of Systems  Constitutional: Negative.  Negative for appetite change, chills, fatigue, fever, malaise/fatigue and unexpected weight change.  HENT: Negative for congestion, ear discharge, ear pain, rhinorrhea, sinus pressure, sneezing and sore throat.   Eyes: Negative for blurred vision, double vision, photophobia, pain, discharge, redness and itching.  Respiratory: Negative for cough, shortness of breath, wheezing and stridor.   Cardiovascular: Negative for chest pain, palpitations, orthopnea and PND.  Gastrointestinal: Negative for abdominal pain,  blood in stool, constipation, diarrhea, nausea and vomiting.  Endocrine: Negative for cold intolerance, heat intolerance, polydipsia, polyphagia and polyuria.  Genitourinary: Negative for  dysuria, flank pain, frequency, hematuria, menstrual problem, pelvic pain, urgency, vaginal bleeding and vaginal discharge.  Musculoskeletal: Negative for arthralgias, back pain, myalgias and neck pain.  Skin: Negative for rash.  Allergic/Immunologic: Negative for environmental allergies and food allergies.  Neurological: Positive for headaches. Negative for dizziness, weakness, light-headedness and numbness.  Hematological: Negative for adenopathy. Does not bruise/bleed easily.  Psychiatric/Behavioral: Positive for depression. Negative for decreased concentration, dysphoric mood and suicidal ideas. The patient is not nervous/anxious and does not have insomnia.     Patient Active Problem List   Diagnosis Date Noted  . 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 Family history of malignant neoplasm of gastrointestinal tract   . Benign neoplasm of ascending colon   . Benign neoplasm of transverse colon   . Polyp of sigmoid colon   . Gastric polyp   . Status post Nissen fundoplication (without gastrostomy tube) procedure   . Esophageal dysphagia   . Heartburn   . Essential hypertension 09/03/2016  . Periodic headache syndrome, not intractable 09/03/2016  . Gastroesophageal reflux disease 09/03/2016  . Multiple thyroid nodules 09/03/2016  . Vitamin D deficiency 09/03/2016  . Chronic anxiety 09/03/2016  . Depression, recurrent (HCC) 09/03/2016  . DDD (degenerative disc disease), lumbar 02/26/2015  . Bilateral occipital neuralgia 01/27/2015  . Spinal stenosis of lumbar region 11/25/2014  . DDD (degenerative disc disease), cervical 11/24/2014  . Status post cervical spinal fusion 11/24/2014  . Lumbosacral facet joint syndrome 11/24/2014  . Sacroiliac joint dysfunction of both sides 11/24/2014  . Greater trochanteric bursitis of both hips 11/24/2014  . Cervical facet syndrome 11/24/2014  . Hyperlipidemia   . Chest pain 10/26/2012  . Palpitations 10/26/2012    Allergies  Allergen Reactions  .  Codeine Itching  . Hydrocodone Itching  . Morphine Itching  . Morphine And Related   . Oxybutynin Itching  . Oxycodone Itching  . Tramadol-Acetaminophen Itching    Past Surgical History:  Procedure Laterality Date  . ANTERIOR CERVICAL DECOMP/DISCECTOMY FUSION    . BLADDER SUSPENSION    . CARDIAC CATHETERIZATION  11/23/12   [facility];EF 65%  . CARPAL TUNNEL RELEASE Bilateral   . CHOLECYSTECTOMY    . COLONOSCOPY WITH PROPOFOL N/A 06/13/2018   Procedure: COLONOSCOPY WITH BIOPSIES;  Surgeon: Pasty Spillers, MD;  Location: [facility];  Service: Endoscopy;  Laterality: N/A;  . ESOPHAGOGASTRODUODENOSCOPY (EGD) WITH PROPOFOL N/A 06/13/2018   Procedure: ESOPHAGOGASTRODUODENOSCOPY (EGD) WITH BIOPSIES;  Surgeon: Pasty Spillers, MD;  Location: [facility];  Service: Endoscopy;  Laterality: N/A;  . HERNIA REPAIR    . KNEE SURGERY Left   . LAPAROSCOPY    . NECK SURGERY    . NISSEN FUNDOPLICATION    . POLYPECTOMY N/A 06/13/2018   Procedure: POLYPECTOMY;  Surgeon: Pasty Spillers, MD;  Location: [facility];  Service: Endoscopy;  Laterality: N/A;  . TONSILLECTOMY    . TOTAL ABDOMINAL HYSTERECTOMY      Social History   Tobacco Use  . Smoking status: Former Smoker    Packs/day: 0.50    Years: 25.00    Pack years: 12.50    Types: Cigarettes    Quit date: 2018    Years since quitting: 4.0  . Smokeless tobacco: Never Used  Vaping Use  . Vaping Use: Never used  Substance Use Topics  . Alcohol use: No  Alcohol/week: 0.0 standard drinks    Comment: 1 month  . Drug use: No     Medication list has been reviewed and updated.  Current Meds  Medication Sig  . acetaminophen (TYLENOL) 325 MG tablet Take 650 mg by mouth every 6 (six) hours as needed.  Marland Kitchen buPROPion (WELLBUTRIN XL) 150 MG 24 hr tablet TAKE 1 TABLET BY MOUTH EVERY DAY IN THE MORNING  . busPIRone (BUSPAR) 30 MG tablet TAKE 1 TABLET (30 MG TOTAL) BY MOUTH 2 (TWO) TIMES DAILY.  . DULoxetine  (CYMBALTA) 60 MG capsule Take 1-2 mg by mouth daily. Dr Primus Bravo  . esomeprazole (NEXIUM) 40 MG capsule TAKE ONE CAPSULE BY MOUTH EVERY DAY AS DIRECTED BY PROVIDER  . ezetimibe (ZETIA) 10 MG tablet Take 1 tablet (10 mg total) by mouth daily. Reported on 10/15/2015  . gabapentin (NEURONTIN) 600 MG tablet Take 600 mg by mouth 2 (two) times daily. Dr Primus Bravo  . methadone (DOLOPHINE) 5 MG tablet Limit 2-3 tabs by mouth per day if tolerated (Patient taking differently: Take 5 mg by mouth. Limit 2-3 tabs by mouth per day if tolerated- Dr Primus Bravo)  . metoprolol succinate (TOPROL-XL) 100 MG 24 hr tablet TAKE 1 TABLET (100 MG TOTAL) BY MOUTH DAILY. TAKE WITH OR IMMEDIATELY FOLLOWING A MEAL.  . Multiple Vitamin (MULTI-VITAMINS) TABS Take by mouth.  . mupirocin ointment (BACTROBAN) 2 % Apply 1 application topically 2 (two) times daily.  Marland Kitchen oxybutynin (DITROPAN-XL) 5 MG 24 hr tablet TAKE 1 TABLET BY MOUTH EVERYDAY AT BEDTIME  . simvastatin (ZOCOR) 80 MG tablet TAKE 1 TABLET BY MOUTH EVERY DAY  . tiZANidine (ZANAFLEX) 2 MG tablet Limit  2-4 tablets by mouth per day tolerated (Patient taking differently: Take 2 mg by mouth once. Limit  2-4 tablets by mouth per day tolerated/ Dr Primus Bravo)  . topiramate (TOPAMAX) 50 MG tablet TAKE 1 TABLET BY MOUTH TWICE A DAY  . Vitamin D, Ergocalciferol, (DRISDOL) 1.25 MG (50000 UNIT) CAPS capsule TAKE 1 CAPSULE BY MOUTH ONE TIME PER WEEK   Current Facility-Administered Medications for the 07/07/20 encounter (Office Visit) with Juline Patch, MD  Medication  . bupivacaine (MARCAINE) 0.5 % injection 30 mL  . bupivacaine (PF) (MARCAINE) 0.25 % injection 30 mL  . bupivacaine (PF) (MARCAINE) 0.25 % injection 30 mL  . fentaNYL (SUBLIMAZE) injection 100 mcg  . fentaNYL (SUBLIMAZE) injection 100 mcg  . fentaNYL (SUBLIMAZE) injection 100 mcg  . lactated ringers infusion 1,000 mL  . lactated ringers infusion 1,000 mL  . lactated ringers infusion 1,000 mL  . lactated ringers infusion 1,000 mL   . lidocaine (PF) (XYLOCAINE) 1 % injection 10 mL  . midazolam (VERSED) 5 MG/5ML injection 5 mg  . midazolam (VERSED) 5 MG/5ML injection 5 mg  . midazolam (VERSED) 5 MG/5ML injection 5 mg  . orphenadrine (NORFLEX) injection 60 mg  . orphenadrine (NORFLEX) injection 60 mg  . orphenadrine (NORFLEX) injection 60 mg  . sodium chloride 0.9 % injection 20 mL  . triamcinolone acetonide (KENALOG-40) injection 40 mg  . triamcinolone acetonide (KENALOG-40) injection 40 mg  . triamcinolone acetonide (KENALOG-40) injection 40 mg    PHQ 2/9 Scores 07/07/2020 03/19/2020 12/07/2019 05/04/2019  PHQ - 2 Score 0 0 0 0  PHQ- 9 Score 0 0 0 0  Exception Documentation - - - -    GAD 7 : Generalized Anxiety Score 03/19/2020 12/07/2019 05/04/2019  Nervous, Anxious, on Edge 0 0 0  Control/stop worrying 0 0 0  Worry too much -  different things 0 0 0  Trouble relaxing 0 0 0  Restless 0 0 0  Easily annoyed or irritable 0 0 0  Afraid - awful might happen 0 0 0  Total GAD 7 Score 0 0 0  Anxiety Difficulty - Not difficult at all -    BP Readings from Last 3 Encounters:  07/07/20 (!) 150/110  03/19/20 (!) 130/94  12/07/19 124/78    Physical Exam Vitals and nursing note reviewed.  Constitutional:      General: She is not irritable.She is not in acute distress.    Appearance: She is not diaphoretic.  HENT:     Head: Normocephalic and atraumatic.     Right Ear: External ear normal.     Left Ear: External ear normal.     Nose: Nose normal.     Mouth/Throat:     Mouth: Oropharynx is clear and moist.  Eyes:     General:        Right eye: No discharge.        Left eye: No discharge.     Extraocular Movements: EOM normal.     Conjunctiva/sclera: Conjunctivae normal.     Pupils: Pupils are equal, round, and reactive to light.  Neck:     Thyroid: No thyromegaly.     Vascular: No JVD.  Cardiovascular:     Rate and Rhythm: Normal rate and regular rhythm.     Pulses: Intact distal pulses.     Heart sounds:  Normal heart sounds. No murmur heard. No friction rub. No gallop.   Pulmonary:     Effort: Pulmonary effort is normal.     Breath sounds: Normal breath sounds.  Abdominal:     General: Bowel sounds are normal.     Palpations: Abdomen is soft. There is no mass.     Tenderness: There is no abdominal tenderness. There is no guarding.  Musculoskeletal:        General: No edema. Normal range of motion.     Cervical back: Normal range of motion and neck supple.  Lymphadenopathy:     Cervical: No cervical adenopathy.  Skin:    General: Skin is warm and dry.  Neurological:     Mental Status: She is alert.     Deep Tendon Reflexes: Reflexes are normal and symmetric.     Wt Readings from Last 3 Encounters:  07/07/20 192 lb (87.1 kg)  03/19/20 194 lb (88 kg)  12/07/19 198 lb (89.8 kg)    BP (!) 150/110   Pulse 80   Ht 5\' 10"  (1.778 m)   Wt 192 lb (87.1 kg)   BMI 27.55 kg/m   Assessment and Plan: 1. Depression, recurrent (HCC) Chronic.  Controlled.  Stable.  PHQ 0 gad score 0 continue bupropion XL 150 mg 1 tablet daily. - buPROPion (WELLBUTRIN XL) 150 MG 24 hr tablet; TAKE 1 TABLET BY MOUTH EVERY DAY IN THE MORNING  Dispense: 90 tablet; Refill: 1  2. Chronic anxiety Chronic.  Controlled.  Stable.  Gad score 0 continue buspirone 30 mg 1 tablet twice a day. - busPIRone (BUSPAR) 30 MG tablet; Take 1 tablet (30 mg total) by mouth 2 (two) times daily.  Dispense: 180 tablet; Refill: 1  3. Gastroesophageal reflux disease Chronic.  Controlled.  Stable.  Continue present dosing of Nexium 40 mg once a day.  Patient has 90 days remaining and will refill it upon request and 90 days.  4. Mixed hyperlipidemia Chronic.  Controlled.  Stable.  Patient is currently on Zetia 10 mg once a day.  We will recheck this in 9280 days. - ezetimibe (ZETIA) 10 MG tablet; Take 1 tablet (10 mg total) by mouth daily. Reported on 10/15/2015  Dispense: 90 tablet; Refill: 1 - simvastatin (ZOCOR) 80 MG tablet; TAKE  1 TABLET BY MOUTH EVERY DAY  Dispense: 90 tablet; Refill: 1  5. Essential hypertension Chronic.  Uncontrolled.  Patient is on [place] an elevated blood pressure reading but is usually due to not either having taken her medication or insufficient time from the taking of the medicine for to be effective.  Patient is supposed to be on metoprolol XL 100 mg once a day and has been on as high as 200 mg a day.  I explained the importance of controlling blood pressure and that we need to have this checked within a couple hours from the time of taking of the medicine for sufficient reading and appropriate blood pressure value.  Patient has been given 30 days with 3 weeks to return and be on medication when we see her on her next visit - metoprolol succinate (TOPROL-XL) 100 MG 24 hr tablet; Take 1 tablet (100 mg total) by mouth daily. Take with or immediately following a meal.  Dispense: 30 tablet; Refill: 0  6. Periodic headache syndrome, not intractable Chronic.  Controlled.  Stable.  Continue topiramate 50 mg twice a day for prophylaxis of periodic headache syndrome. - topiramate (TOPAMAX) 50 MG tablet; Take 1 tablet (50 mg total) by mouth 2 (two) times daily.  Dispense: 60 tablet; Refill: 5

## 2020-07-09 DIAGNOSIS — G894 Chronic pain syndrome: Secondary | ICD-10-CM | POA: Diagnosis not present

## 2020-07-14 DIAGNOSIS — M4322 Fusion of spine, cervical region: Secondary | ICD-10-CM | POA: Diagnosis not present

## 2020-07-14 DIAGNOSIS — M1990 Unspecified osteoarthritis, unspecified site: Secondary | ICD-10-CM | POA: Diagnosis not present

## 2020-07-14 DIAGNOSIS — G894 Chronic pain syndrome: Secondary | ICD-10-CM | POA: Diagnosis not present

## 2020-07-14 DIAGNOSIS — M5481 Occipital neuralgia: Secondary | ICD-10-CM | POA: Diagnosis not present

## 2020-07-14 DIAGNOSIS — M545 Low back pain, unspecified: Secondary | ICD-10-CM | POA: Diagnosis not present

## 2020-07-14 DIAGNOSIS — R6889 Other general symptoms and signs: Secondary | ICD-10-CM | POA: Diagnosis not present

## 2020-07-31 ENCOUNTER — Other Ambulatory Visit: Payer: Self-pay

## 2020-07-31 ENCOUNTER — Encounter: Payer: Self-pay | Admitting: Family Medicine

## 2020-07-31 ENCOUNTER — Ambulatory Visit (INDEPENDENT_AMBULATORY_CARE_PROVIDER_SITE_OTHER): Payer: Medicare Other | Admitting: Family Medicine

## 2020-07-31 VITALS — BP 130/100 | HR 76 | Ht 70.0 in | Wt 188.0 lb

## 2020-07-31 DIAGNOSIS — I10 Essential (primary) hypertension: Secondary | ICD-10-CM

## 2020-07-31 DIAGNOSIS — R6889 Other general symptoms and signs: Secondary | ICD-10-CM | POA: Diagnosis not present

## 2020-07-31 MED ORDER — METOPROLOL SUCCINATE ER 100 MG PO TB24: 100.0000 mg | ORAL_TABLET | Freq: Every day | ORAL | 1 refills | Status: DC

## 2020-07-31 MED ORDER — LOSARTAN POTASSIUM 25 MG PO TABS: 25.0000 mg | ORAL_TABLET | Freq: Every day | ORAL | Status: DC

## 2020-07-31 NOTE — Progress Notes (Signed)
Date:  07/31/2020   Name:  Jamie Kim   DOB:  [birth year]   MRN:  [id number]   Chief Complaint: Hypertension (Follow up)  Hypertension This is a chronic problem. The current episode started more than 1 year ago. The problem is unchanged. The problem is uncontrolled. Pertinent negatives include no anxiety, blurred vision, chest pain, headaches, malaise/fatigue, neck pain, orthopnea, palpitations, peripheral edema, PND, shortness of breath or sweats. There are no associated agents to hypertension. Risk factors for coronary artery disease include dyslipidemia (prediabetes). Past treatments include beta blockers. The current treatment provides mild improvement. There are no compliance problems.  There is no history of angina, kidney disease, CAD/MI, CVA, heart failure, left ventricular hypertrophy, PVD or retinopathy. There is no history of chronic renal disease, a hypertension causing med or renovascular disease.    Lab Results  Component Value Date   CREATININE 0.78 05/04/2019   BUN 11 05/04/2019   NA 141 05/04/2019   K 4.7 05/04/2019   CL 104 05/04/2019   CO2 25 05/04/2019   Lab Results  Component Value Date   CHOL 160 05/04/2019   HDL 57 05/04/2019   LDLCALC 68 05/04/2019   TRIG 212 (H) 05/04/2019   CHOLHDL 2.5 11/02/2018   Lab Results  Component Value Date   TSH 1.690 12/07/2019   No results found for: HGBA1C Lab Results  Component Value Date   WBC 4.9 11/20/2012   HGB 12.2 11/20/2012   HCT 36.0 11/20/2012   MCV 91 11/20/2012   PLT 396 (H) 11/20/2012   Lab Results  Component Value Date   ALT 13 11/02/2018   AST 17 11/02/2018   ALKPHOS 68 11/02/2018   BILITOT <0.2 11/02/2018     Review of Systems  Constitutional: Negative.  Negative for chills, fatigue, fever, malaise/fatigue and unexpected weight change.  HENT: Negative for congestion, ear discharge, ear pain, rhinorrhea, sinus pressure, sneezing and sore throat.   Eyes: Negative for blurred vision,  double vision, photophobia, pain, discharge, redness and itching.  Respiratory: Negative for cough, shortness of breath, wheezing and stridor.   Cardiovascular: Negative for chest pain, palpitations, orthopnea and PND.  Gastrointestinal: Negative for abdominal pain, blood in stool, constipation, diarrhea, nausea and vomiting.  Endocrine: Negative for cold intolerance, heat intolerance, polydipsia, polyphagia and polyuria.  Genitourinary: Negative for dysuria, flank pain, frequency, hematuria, menstrual problem, pelvic pain, urgency, vaginal bleeding and vaginal discharge.  Musculoskeletal: Negative for arthralgias, back pain, myalgias and neck pain.  Skin: Negative for rash.  Allergic/Immunologic: Negative for environmental allergies and food allergies.  Neurological: Negative for dizziness, weakness, light-headedness, numbness and headaches.  Hematological: Negative for adenopathy. Does not bruise/bleed easily.  Psychiatric/Behavioral: Negative for dysphoric mood. The patient is not nervous/anxious.     Patient Active Problem List   Diagnosis Date Noted  . 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 Family history of malignant neoplasm of gastrointestinal tract   . Benign neoplasm of ascending colon   . Benign neoplasm of transverse colon   . Polyp of sigmoid colon   . Gastric polyp   . Status post Nissen fundoplication (without gastrostomy tube) procedure   . Esophageal dysphagia   . Heartburn   . Essential hypertension 09/03/2016  . Periodic headache syndrome, not intractable 09/03/2016  . Gastroesophageal reflux disease 09/03/2016  . Multiple thyroid nodules 09/03/2016  . Vitamin D deficiency 09/03/2016  . Chronic anxiety 09/03/2016  . Depression, recurrent ([place]) 09/03/2016  . DDD (degenerative disc disease), lumbar 02/26/2015  . Bilateral  occipital neuralgia 01/27/2015  . Spinal stenosis of lumbar region 11/25/2014  . DDD (degenerative disc disease), cervical 11/24/2014  . Status post  cervical spinal fusion 11/24/2014  . Lumbosacral facet joint syndrome 11/24/2014  . Sacroiliac joint dysfunction of both sides 11/24/2014  . Greater trochanteric bursitis of both hips 11/24/2014  . Cervical facet syndrome 11/24/2014  . Hyperlipidemia   . Chest pain 10/26/2012  . Palpitations 10/26/2012    Allergies  Allergen Reactions  . Codeine Itching  . Hydrocodone Itching  . Morphine Itching  . Morphine And Related   . Oxybutynin Itching  . Oxycodone Itching  . Tramadol-Acetaminophen Itching    Past Surgical History:  Procedure Laterality Date  . ANTERIOR CERVICAL DECOMP/DISCECTOMY FUSION    . BLADDER SUSPENSION    . CARDIAC CATHETERIZATION  11/23/12   [facility];EF 65%  . CARPAL TUNNEL RELEASE Bilateral   . CHOLECYSTECTOMY    . COLONOSCOPY WITH PROPOFOL N/A 06/13/2018   Procedure: COLONOSCOPY WITH BIOPSIES;  Surgeon: Virgel Manifold, MD;  Location: [facility];  Service: Endoscopy;  Laterality: N/A;  . ESOPHAGOGASTRODUODENOSCOPY (EGD) WITH PROPOFOL N/A 06/13/2018   Procedure: ESOPHAGOGASTRODUODENOSCOPY (EGD) WITH BIOPSIES;  Surgeon: Virgel Manifold, MD;  Location: [facility];  Service: Endoscopy;  Laterality: N/A;  . HERNIA REPAIR    . KNEE SURGERY Left   . LAPAROSCOPY    . NECK SURGERY    . NISSEN FUNDOPLICATION    . POLYPECTOMY N/A 06/13/2018   Procedure: POLYPECTOMY;  Surgeon: Virgel Manifold, MD;  Location: [facility];  Service: Endoscopy;  Laterality: N/A;  . TONSILLECTOMY    . TOTAL ABDOMINAL HYSTERECTOMY      Social History   Tobacco Use  . Smoking status: Former Smoker    Packs/day: 0.50    Years: 25.00    Pack years: 12.50    Types: Cigarettes    Quit date: 2018    Years since quitting: 4.0  . Smokeless tobacco: Never Used  Vaping Use  . Vaping Use: Never used  Substance Use Topics  . Alcohol use: No    Alcohol/week: 0.0 standard drinks    Comment: 1 month  . Drug use: No     Medication list has been  reviewed and updated.  Current Meds  Medication Sig  . acetaminophen (TYLENOL) 325 MG tablet Take 650 mg by mouth every 6 (six) hours as needed.  Marland Kitchen buPROPion (WELLBUTRIN XL) 150 MG 24 hr tablet TAKE 1 TABLET BY MOUTH EVERY DAY IN THE MORNING  . busPIRone (BUSPAR) 30 MG tablet Take 1 tablet (30 mg total) by mouth 2 (two) times daily.  . DULoxetine (CYMBALTA) 60 MG capsule Take 1-2 mg by mouth daily. Dr Primus Bravo  . esomeprazole (NEXIUM) 40 MG capsule TAKE ONE CAPSULE BY MOUTH EVERY DAY AS DIRECTED BY PROVIDER  . ezetimibe (ZETIA) 10 MG tablet Take 1 tablet (10 mg total) by mouth daily. Reported on 10/15/2015  . gabapentin (NEURONTIN) 600 MG tablet Take 600 mg by mouth 2 (two) times daily. Dr Primus Bravo  . methadone (DOLOPHINE) 5 MG tablet Limit 2-3 tabs by mouth per day if tolerated (Patient taking differently: Take 5 mg by mouth. Limit 2-3 tabs by mouth per day if tolerated- Dr Primus Bravo)  . metoprolol succinate (TOPROL-XL) 100 MG 24 hr tablet Take 1 tablet (100 mg total) by mouth daily. Take with or immediately following a meal.  . Multiple Vitamin (MULTI-VITAMINS) TABS Take by mouth.  . mupirocin ointment (BACTROBAN) 2 % Apply 1 application  topically 2 (two) times daily.  Marland Kitchen oxybutynin (DITROPAN-XL) 5 MG 24 hr tablet TAKE 1 TABLET BY MOUTH EVERYDAY AT BEDTIME  . simvastatin (ZOCOR) 80 MG tablet TAKE 1 TABLET BY MOUTH EVERY DAY  . tiZANidine (ZANAFLEX) 2 MG tablet Limit  2-4 tablets by mouth per day tolerated (Patient taking differently: Take 2 mg by mouth once. Limit  2-4 tablets by mouth per day tolerated/ Dr Primus Bravo)  . topiramate (TOPAMAX) 50 MG tablet Take 1 tablet (50 mg total) by mouth 2 (two) times daily.  . Vitamin D, Ergocalciferol, (DRISDOL) 1.25 MG (50000 UNIT) CAPS capsule TAKE 1 CAPSULE BY MOUTH ONE TIME PER WEEK   Current Facility-Administered Medications for the 07/31/20 encounter (Office Visit) with Juline Patch, MD  Medication  . bupivacaine (MARCAINE) 0.5 % injection 30 mL  . bupivacaine  (PF) (MARCAINE) 0.25 % injection 30 mL  . bupivacaine (PF) (MARCAINE) 0.25 % injection 30 mL  . fentaNYL (SUBLIMAZE) injection 100 mcg  . fentaNYL (SUBLIMAZE) injection 100 mcg  . fentaNYL (SUBLIMAZE) injection 100 mcg  . lactated ringers infusion 1,000 mL  . lactated ringers infusion 1,000 mL  . lactated ringers infusion 1,000 mL  . lactated ringers infusion 1,000 mL  . lidocaine (PF) (XYLOCAINE) 1 % injection 10 mL  . midazolam (VERSED) 5 MG/5ML injection 5 mg  . midazolam (VERSED) 5 MG/5ML injection 5 mg  . midazolam (VERSED) 5 MG/5ML injection 5 mg  . orphenadrine (NORFLEX) injection 60 mg  . orphenadrine (NORFLEX) injection 60 mg  . orphenadrine (NORFLEX) injection 60 mg  . sodium chloride 0.9 % injection 20 mL  . triamcinolone acetonide (KENALOG-40) injection 40 mg  . triamcinolone acetonide (KENALOG-40) injection 40 mg  . triamcinolone acetonide (KENALOG-40) injection 40 mg    PHQ 2/9 Scores 07/07/2020 03/19/2020 12/07/2019 05/04/2019  PHQ - 2 Score 0 0 0 0  PHQ- 9 Score 0 0 0 0  Exception Documentation - - - -    GAD 7 : Generalized Anxiety Score 03/19/2020 12/07/2019 05/04/2019  Nervous, Anxious, on Edge 0 0 0  Control/stop worrying 0 0 0  Worry too much - different things 0 0 0  Trouble relaxing 0 0 0  Restless 0 0 0  Easily annoyed or irritable 0 0 0  Afraid - awful might happen 0 0 0  Total GAD 7 Score 0 0 0  Anxiety Difficulty - Not difficult at all -    BP Readings from Last 3 Encounters:  07/31/20 (!) 130/100  07/07/20 (!) 150/110  03/19/20 (!) 130/94    Physical Exam Vitals and nursing note reviewed.  Constitutional:      Appearance: She is well-developed and well-nourished.  HENT:     Head: Normocephalic.     Right Ear: Tympanic membrane, ear canal and external ear normal.     Left Ear: Tympanic membrane, ear canal and external ear normal.     Nose: Nose normal.     Mouth/Throat:     Mouth: Oropharynx is clear and moist. Mucous membranes are moist.   Eyes:     General: Lids are everted, no foreign bodies appreciated. No scleral icterus.       Left eye: No foreign body or hordeolum.     Extraocular Movements: EOM normal.     Conjunctiva/sclera: Conjunctivae normal.     Right eye: Right conjunctiva is not injected.     Left eye: Left conjunctiva is not injected.     Pupils: Pupils are equal, round, and reactive  to light.  Neck:     Thyroid: No thyromegaly.     Vascular: No JVD.     Trachea: No tracheal deviation.  Cardiovascular:     Rate and Rhythm: Normal rate and regular rhythm.     Pulses: Intact distal pulses.     Heart sounds: Normal heart sounds. No murmur heard. No friction rub. No gallop.   Pulmonary:     Effort: Pulmonary effort is normal. No respiratory distress.     Breath sounds: Normal breath sounds. No wheezing, rhonchi or rales.  Abdominal:     General: Bowel sounds are normal.     Palpations: Abdomen is soft. There is no hepatosplenomegaly or mass.     Tenderness: There is no abdominal tenderness. There is no guarding or rebound.  Musculoskeletal:        General: No tenderness or edema. Normal range of motion.     Cervical back: Normal range of motion and neck supple.  Lymphadenopathy:     Cervical: No cervical adenopathy.  Skin:    General: Skin is warm.     Findings: No rash.  Neurological:     Mental Status: She is alert and oriented to person, place, and time.     Cranial Nerves: No cranial nerve deficit.     Deep Tendon Reflexes: Strength normal. Reflexes normal.  Psychiatric:        Mood and Affect: Mood and affect normal. Mood is not anxious or depressed.     Wt Readings from Last 3 Encounters:  07/31/20 188 lb (85.3 kg)  07/07/20 192 lb (87.1 kg)  03/19/20 194 lb (88 kg)    BP (!) 130/100   Pulse 76   Ht 5\' 10"  (1.778 m)   Wt 188 lb (85.3 kg)   BMI 26.98 kg/m   Assessment and Plan: 1. Essential hypertension Chronic.  Uncontrolled.  Stable.  Blood pressure today is 130/100.  Blood  pressure has been trending particularly the diastolic in an elevated area.  Patient is done well with her diet and has fact lost weight.  Patient's been reassured to continue to lose weight and has been given a prediabetic eating plan in the meantime we will add to her metoprolol 100 mg XL losartan 25 mg once a day patient will return in 6 weeks and will recheck blood pressure week intensely went lower dose just to come and make sure that the patient has stable because she is on multiple medications as it is now. - metoprolol succinate (TOPROL-XL) 100 MG 24 hr tablet; Take 1 tablet (100 mg total) by mouth daily. Take with or immediately following a meal.  Dispense: 60 tablet; Refill: 1

## 2020-07-31 NOTE — Patient Instructions (Signed)
Prediabetes Eating Plan Prediabetes is a condition that causes blood sugar (glucose) levels to be higher than normal. This increases the risk for developing type 2 diabetes (type 2 diabetes mellitus). Working with a health care provider or nutrition specialist (dietitian) to make diet and lifestyle changes can help prevent the onset of diabetes. These changes may help you:  Control your blood glucose levels.  Improve your cholesterol levels.  Manage your blood pressure. What are tips for following this plan? Reading food labels  Read food labels to check the amount of fat, salt (sodium), and sugar in prepackaged foods. Avoid foods that have: ? Saturated fats. ? Trans fats. ? Added sugars.  Avoid foods that have more than 300 milligrams (mg) of sodium per serving. Limit your sodium intake to less than 2,300 mg each day. Shopping  Avoid buying pre-made and processed foods.  Avoid buying drinks with added sugar. Cooking  Cook with olive oil. Do not use butter, lard, or ghee.  Bake, broil, grill, steam, or boil foods. Avoid frying. Meal planning  Work with your dietitian to create an eating plan that is right for you. This may include tracking how many calories you take in each day. Use a food diary, notebook, or mobile application to track what you eat at each meal.  Consider following a Mediterranean diet. This includes: ? Eating several servings of fresh fruits and vegetables each day. ? Eating fish at least twice a week. ? Eating one serving each day of whole grains, beans, nuts, and seeds. ? Using olive oil instead of other fats. ? Limiting alcohol. ? Limiting red meat. ? Using nonfat or low-fat dairy products.  Consider following a plant-based diet. This includes dietary choices that focus on eating mostly vegetables and fruit, grains, beans, nuts, and seeds.  If you have high blood pressure, you may need to limit your sodium intake or follow a diet such as the DASH  (Dietary Approaches to Stop Hypertension) eating plan. The DASH diet aims to lower high blood pressure.   Lifestyle  Set weight loss goals with help from your health care team. It is recommended that most people with prediabetes lose 7% of their body weight.  Exercise for at least 30 minutes 5 or more days a week.  Attend a support group or seek support from a mental health counselor.  Take over-the-counter and prescription medicines only as told by your health care provider. What foods are recommended? Fruits Berries. Bananas. Apples. Oranges. Grapes. Papaya. Mango. Pomegranate. Kiwi. Grapefruit. Cherries. Vegetables Lettuce. Spinach. Peas. Beets. Cauliflower. Cabbage. Broccoli. Carrots. Tomatoes. Squash. Eggplant. Herbs. Peppers. Onions. Cucumbers. Brussels sprouts. Grains Whole grains, such as whole-wheat or whole-grain breads, crackers, cereals, and pasta. Unsweetened oatmeal. Bulgur. Barley. Quinoa. Brown rice. Corn or whole-wheat flour tortillas or taco shells. Meats and other proteins Seafood. Poultry without skin. Lean cuts of pork and beef. Tofu. Eggs. Nuts. Beans. Dairy Low-fat or fat-free dairy products, such as yogurt, cottage cheese, and cheese. Beverages Water. Tea. Coffee. Sugar-free or diet soda. Seltzer water. Low-fat or nonfat milk. Milk alternatives, such as soy or almond milk. Fats and oils Olive oil. Canola oil. Sunflower oil. Grapeseed oil. Avocado. Walnuts. Sweets and desserts Sugar-free or low-fat pudding. Sugar-free or low-fat ice cream and other frozen treats. Seasonings and condiments Herbs. Sodium-free spices. Mustard. Relish. Low-salt, low-sugar ketchup. Low-salt, low-sugar barbecue sauce. Low-fat or fat-free mayonnaise. The items listed above may not be a complete list of recommended foods and beverages. Contact a dietitian for more   information. What foods are not recommended? Fruits Fruits canned with syrup. Vegetables Canned vegetables. Frozen  vegetables with butter or cream sauce. Grains Refined white flour and flour products, such as bread, pasta, snack foods, and cereals. Meats and other proteins Fatty cuts of meat. Poultry with skin. Breaded or fried meat. Processed meats. Dairy Full-fat yogurt, cheese, or milk. Beverages Sweetened drinks, such as iced tea and soda. Fats and oils Butter. Lard. Ghee. Sweets and desserts Baked goods, such as cake, cupcakes, pastries, cookies, and cheesecake. Seasonings and condiments Spice mixes with added salt. Ketchup. Barbecue sauce. Mayonnaise. The items listed above may not be a complete list of foods and beverages that are not recommended. Contact a dietitian for more information. Where to find more information  American Diabetes Association: www.diabetes.org Summary  You may need to make diet and lifestyle changes to help prevent the onset of diabetes. These changes can help you control blood sugar, improve cholesterol levels, and manage blood pressure.  Set weight loss goals with help from your health care team. It is recommended that most people with prediabetes lose 7% of their body weight.  Consider following a Mediterranean diet. This includes eating plenty of fresh fruits and vegetables, whole grains, beans, nuts, seeds, fish, and low-fat dairy, and using olive oil instead of other fats. This information is not intended to replace advice given to you by your health care provider. Make sure you discuss any questions you have with your health care provider. Document Revised: 09/20/2019 Document Reviewed: 09/20/2019 Elsevier Patient Education  2021 Elsevier Inc.  

## 2020-08-03 ENCOUNTER — Other Ambulatory Visit: Payer: Self-pay | Admitting: Family Medicine

## 2020-08-03 DIAGNOSIS — F339 Major depressive disorder, recurrent, unspecified: Secondary | ICD-10-CM

## 2020-08-03 DIAGNOSIS — N3941 Urge incontinence: Secondary | ICD-10-CM

## 2020-08-03 DIAGNOSIS — E559 Vitamin D deficiency, unspecified: Secondary | ICD-10-CM

## 2020-08-03 NOTE — Telephone Encounter (Signed)
Requested Prescriptions  Pending Prescriptions Disp Refills  . Vitamin D, Ergocalciferol, (DRISDOL) 1.25 MG (50000 UNIT) CAPS capsule [Pharmacy Med Name: VITAMIN D2 1.25MG (50,000 UNIT)] 12 capsule     Sig: TAKE 1 CAPSULE BY MOUTH ONE TIME PER WEEK     Endocrinology:  Vitamins - Vitamin D Supplementation Failed - 08/03/2020  9:24 AM      Failed - 50,000 IU strengths are not delegated      Failed - Ca in normal range and within 360 days    Calcium  Date Value Ref Range Status  05/04/2019 9.7 8.7 - 10.2 mg/dL Final   Calcium, Total  Date Value Ref Range Status  10/02/2012 9.6 8.5 - 10.1 mg/dL Final         Failed - Phosphate in normal range and within 360 days    Phosphorus  Date Value Ref Range Status  05/04/2019 4.8 (H) 3.0 - 4.3 mg/dL Final         Failed - Vitamin D in normal range and within 360 days    No results found for: [id number], [id number], [id number], 25OHVITD3, 25OHVITD2, [place], 25OHVITD2, 25OHVITD1, 25OHVITD2, 25OHVITD3, VD25OH       Passed - Valid encounter within last 12 months    Recent Outpatient Visits          3 days ago Essential hypertension   [facility], Deanna C, MD   3 weeks ago Depression, recurrent ([place])   [facility] Juline Patch, MD   4 months ago [facility] Juline Patch, MD   8 months ago Essential hypertension   [facility] Juline Patch, MD   1 year ago Chronic anxiety   [facility] Juline Patch, MD      Future Appointments            In 1 month Juline Patch, MD [facility]           . oxybutynin (DITROPAN-XL) 5 MG 24 hr tablet [Pharmacy Med Name: OXYBUTYNIN CL ER 5 MG TABLET] 90 tablet 2    Sig: TAKE 1 TABLET BY MOUTH EVERYDAY AT BEDTIME     Urology:  Bladder Agents Passed - 08/03/2020  9:24 AM      Passed - Valid encounter within last 12 months    Recent Outpatient Visits          3 days ago Essential hypertension   [facility] Juline Patch, MD   3 weeks ago Depression, recurrent ([place])   [facility] Juline Patch, MD   4 months ago [facility] Juline Patch, MD   8 months ago Essential hypertension   [facility] Juline Patch, MD   1 year ago Chronic anxiety   [facility] Juline Patch, MD      Future Appointments            In 1 month Juline Patch, MD [facility]           . buPROPion (WELLBUTRIN XL) 150 MG 24 hr tablet [Pharmacy Med Name: BUPROPION HCL XL 150 MG TABLET] 180 tablet     Sig: TAKE 1 TABLET BY MOUTH EVERY MORNING     Psychiatry: Antidepressants - bupropion Failed - 08/03/2020  9:24 AM      Failed - Last BP in normal range    BP Readings from Last  1 Encounters:  07/31/20 (!) 130/100         Passed - Completed PHQ-2 or PHQ-9 in the last 360 days      Passed - Valid encounter within last 6 months    Recent Outpatient Visits          3 days ago Essential hypertension   [place], MD   3 weeks ago Depression, recurrent [facility])   [facility] Juline Patch, MD   4 months ago [facility] Juline Patch, MD   8 months ago Essential hypertension   [facility] Juline Patch, MD   1 year ago Chronic anxiety   [facility] Juline Patch, MD      Future Appointments            In 1 month Juline Patch, MD [facility]

## 2020-08-03 NOTE — Telephone Encounter (Signed)
Requested medication (s) are due for refill today: yes  Requested medication (s) are on the active medication list: yes  Last refill:  05/19/20  Future visit scheduled: yes  Notes to clinic:  med not delegated to NT to RF   Requested Prescriptions  Pending Prescriptions Disp Refills   Vitamin D, Ergocalciferol, (DRISDOL) 1.25 MG (50000 UNIT) CAPS capsule [Pharmacy Med Name: VITAMIN D2 1.25MG (50,000 UNIT)] 12 capsule     Sig: TAKE 1 CAPSULE BY MOUTH ONE TIME PER WEEK      Endocrinology:  Vitamins - Vitamin D Supplementation Failed - 08/03/2020  9:24 AM      Failed - 50,000 IU strengths are not delegated      Failed - Ca in normal range and within 360 days    Calcium  Date Value Ref Range Status  05/04/2019 9.7 8.7 - 10.2 mg/dL Final   Calcium, Total  Date Value Ref Range Status  10/02/2012 9.6 8.5 - 10.1 mg/dL Final          Failed - Phosphate in normal range and within 360 days    Phosphorus  Date Value Ref Range Status  05/04/2019 4.8 (H) 3.0 - 4.3 mg/dL Final          Failed - Vitamin D in normal range and within 360 days    No results found for: [id number], [id number], [id number], [place], [place], [place], 25OHVITD2, 25OHVITD1, 25OHVITD2, 25OHVITD3, VD25OH        Passed - Valid encounter within last 12 months    Recent Outpatient Visits           3 days ago Essential hypertension   [place], Deanna C, MD   3 weeks ago Depression, recurrent [facility])   [facility] Juline Patch, [facility]   4 months ago [facility], MD   8 months ago Essential hypertension   [facility], MD   1 year ago Chronic anxiety   [facility] Juline [facility], MD       Future Appointments             In 1 month Juline Patch, MD [facility]              Signed Prescriptions Disp Refills   oxybutynin (DITROPAN-XL) 5 MG 24 hr tablet 90 tablet 2    Sig:  TAKE 1 TABLET BY MOUTH EVERYDAY AT BEDTIME      Urology:  Bladder Agents Passed - 08/03/2020  9:24 AM      Passed - Valid encounter within last 12 months    Recent Outpatient Visits           3 days ago Essential hypertension   [place], Deanna C, MD   3 weeks ago Depression, recurrent [facility])   [facility] Jul[facility], MD   4 months ago [facility], MD   8 months ago Essential hypertension   [facility] Juline [facility], MD   1 year ago Chronic anxiety   [facility], MD       Future Appointments             In 1 month Juline Patch, MD [facility], [place] Prescriptions Disp Refills   buPROPion [facility]  XL) 150 MG 24 hr tablet [Pharmacy Med Name: BUPROPION HCL XL 150 MG TABLET] 180 tablet     Sig: TAKE 1 TABLET BY MOUTH EVERY MORNING      Psychiatry: Antidepressants - bupropion Failed - 08/03/2020  9:24 AM      Failed - Last BP in normal range    BP Readings from Last 1 Encounters:  07/31/20 (!) 130/100          Passed - Completed PHQ-2 or PHQ-9 in the last 360 days      Passed - Valid encounter within last 6 months    Recent Outpatient Visits           3 days ago Essential hypertension   [place], MD   3 weeks ago Depression, recurrent [facility])   [facility], [facility]   4 months ago [facility] Juline Patch, MD   8 months ago Essential hypertension   [facility] Jul[facility], MD   1 year ago Chronic anxiety   [facility] Juline Patch, MD       Future Appointments             In 1 month Juline Patch, MD [facility], [facility]

## 2020-08-08 DIAGNOSIS — M5030 Other cervical disc degeneration, unspecified cervical region: Secondary | ICD-10-CM | POA: Diagnosis not present

## 2020-08-08 DIAGNOSIS — M545 Low back pain, unspecified: Secondary | ICD-10-CM | POA: Diagnosis not present

## 2020-08-08 DIAGNOSIS — G894 Chronic pain syndrome: Secondary | ICD-10-CM | POA: Diagnosis not present

## 2020-08-20 DIAGNOSIS — G894 Chronic pain syndrome: Secondary | ICD-10-CM | POA: Diagnosis not present

## 2020-08-20 DIAGNOSIS — M79609 Pain in unspecified limb: Secondary | ICD-10-CM | POA: Diagnosis not present

## 2020-08-20 DIAGNOSIS — M25559 Pain in unspecified hip: Secondary | ICD-10-CM | POA: Diagnosis not present

## 2020-08-20 DIAGNOSIS — M5030 Other cervical disc degeneration, unspecified cervical region: Secondary | ICD-10-CM | POA: Diagnosis not present

## 2020-08-20 DIAGNOSIS — M9941 Connective tissue stenosis of neural canal of cervical region: Secondary | ICD-10-CM | POA: Diagnosis not present

## 2020-08-20 DIAGNOSIS — M9951 Intervertebral disc stenosis of neural canal of cervical region: Secondary | ICD-10-CM | POA: Diagnosis not present

## 2020-08-20 DIAGNOSIS — R519 Headache, unspecified: Secondary | ICD-10-CM | POA: Diagnosis not present

## 2020-08-20 DIAGNOSIS — M4807 Spinal stenosis, lumbosacral region: Secondary | ICD-10-CM | POA: Diagnosis not present

## 2020-08-20 DIAGNOSIS — R6889 Other general symptoms and signs: Secondary | ICD-10-CM | POA: Diagnosis not present

## 2020-08-20 DIAGNOSIS — G8929 Other chronic pain: Secondary | ICD-10-CM | POA: Diagnosis not present

## 2020-08-20 DIAGNOSIS — M545 Low back pain, unspecified: Secondary | ICD-10-CM | POA: Diagnosis not present

## 2020-08-20 DIAGNOSIS — M25519 Pain in unspecified shoulder: Secondary | ICD-10-CM | POA: Diagnosis not present

## 2020-08-20 DIAGNOSIS — M792 Neuralgia and neuritis, unspecified: Secondary | ICD-10-CM | POA: Diagnosis not present

## 2020-08-20 DIAGNOSIS — M48062 Spinal stenosis, lumbar region with neurogenic claudication: Secondary | ICD-10-CM | POA: Diagnosis not present

## 2020-09-07 DIAGNOSIS — M5030 Other cervical disc degeneration, unspecified cervical region: Secondary | ICD-10-CM | POA: Diagnosis not present

## 2020-09-07 DIAGNOSIS — G894 Chronic pain syndrome: Secondary | ICD-10-CM | POA: Diagnosis not present

## 2020-09-07 DIAGNOSIS — M545 Low back pain, unspecified: Secondary | ICD-10-CM | POA: Diagnosis not present

## 2020-09-09 ENCOUNTER — Telehealth: Payer: Self-pay | Admitting: Family Medicine

## 2020-09-09 NOTE — Telephone Encounter (Signed)
Left message for patient to call back and schedule Medicare Annual Wellness Visit (AWV) either virtually/audio only or in office. Whichever the patients preference is.  No history of AWV; please schedule at anytime with [facility] Advisor.  This should be a 40 minute visit [place]

## 2020-09-16 ENCOUNTER — Ambulatory Visit: Payer: Medicare Other | Admitting: Family Medicine

## 2020-09-17 DIAGNOSIS — G894 Chronic pain syndrome: Secondary | ICD-10-CM | POA: Diagnosis not present

## 2020-09-17 DIAGNOSIS — M5030 Other cervical disc degeneration, unspecified cervical region: Secondary | ICD-10-CM | POA: Diagnosis not present

## 2020-09-17 DIAGNOSIS — M545 Low back pain, unspecified: Secondary | ICD-10-CM | POA: Diagnosis not present

## 2020-10-07 DIAGNOSIS — G894 Chronic pain syndrome: Secondary | ICD-10-CM | POA: Diagnosis not present

## 2020-10-07 DIAGNOSIS — M545 Low back pain, unspecified: Secondary | ICD-10-CM | POA: Diagnosis not present

## 2020-10-07 DIAGNOSIS — M5030 Other cervical disc degeneration, unspecified cervical region: Secondary | ICD-10-CM | POA: Diagnosis not present

## 2020-10-15 DIAGNOSIS — Z79891 Long term (current) use of opiate analgesic: Secondary | ICD-10-CM | POA: Diagnosis not present

## 2020-10-15 DIAGNOSIS — G894 Chronic pain syndrome: Secondary | ICD-10-CM | POA: Diagnosis not present

## 2020-10-15 DIAGNOSIS — M5030 Other cervical disc degeneration, unspecified cervical region: Secondary | ICD-10-CM | POA: Diagnosis not present

## 2020-10-15 DIAGNOSIS — M545 Low back pain, unspecified: Secondary | ICD-10-CM | POA: Diagnosis not present

## 2020-10-27 ENCOUNTER — Telehealth: Payer: Self-pay

## 2020-10-27 ENCOUNTER — Other Ambulatory Visit: Payer: Self-pay | Admitting: Family Medicine

## 2020-10-27 ENCOUNTER — Other Ambulatory Visit: Payer: Self-pay

## 2020-10-27 DIAGNOSIS — I10 Essential (primary) hypertension: Secondary | ICD-10-CM

## 2020-10-27 DIAGNOSIS — R6889 Other general symptoms and signs: Secondary | ICD-10-CM | POA: Diagnosis not present

## 2020-10-27 MED ORDER — LOSARTAN POTASSIUM 25 MG PO TABS: 25.0000 mg | ORAL_TABLET | Freq: Every day | ORAL | 0 refills | Status: DC

## 2020-10-27 NOTE — Telephone Encounter (Signed)
Left voicemail to set up medicine refill appointment this week.

## 2020-10-27 NOTE — Telephone Encounter (Signed)
-----   Message from Fredderick Severance sent at 10/27/2020  7:05 AM EDT ----- Pt cancelled her bp follow up on March 25- needs to be scheduled for this week- I will send in a weeks worth of her med

## 2020-10-27 NOTE — Progress Notes (Unsigned)
Sent in 1 week of losartan- pt to be seen this week

## 2020-10-29 DIAGNOSIS — R6889 Other general symptoms and signs: Secondary | ICD-10-CM | POA: Diagnosis not present

## 2020-11-04 ENCOUNTER — Other Ambulatory Visit: Payer: Self-pay | Admitting: Family Medicine

## 2020-11-04 DIAGNOSIS — K219 Gastro-esophageal reflux disease without esophagitis: Secondary | ICD-10-CM

## 2020-11-04 NOTE — Telephone Encounter (Signed)
Requested Prescriptions  Pending Prescriptions Disp Refills  . esomeprazole (NEXIUM) 40 MG capsule [Pharmacy Med Name: ESOMEPRAZOLE MAG DR 40 MG CAP] 90 capsule 2    Sig: TAKE ONE CAPSULE BY MOUTH EVERY DAY AS DIRECTED BY PROVIDER     Gastroenterology: Proton Pump Inhibitors Passed - 11/04/2020  1:26 AM      Passed - Valid encounter within last 12 months    Recent Outpatient Visits          3 months ago Essential hypertension   [facility], [facility]   4 months ago Depression, recurrent [facility])   [facility], [facility]   7 months ago [facility], MD   11 months ago Essential hypertension   [facility] Jul[facility] Patch, MD   1 year ago Chronic anxiety   [facility] Juline Patch, MD

## 2020-11-17 ENCOUNTER — Other Ambulatory Visit: Payer: Self-pay | Admitting: Family Medicine

## 2020-11-17 ENCOUNTER — Other Ambulatory Visit: Payer: Self-pay

## 2020-11-17 DIAGNOSIS — I10 Essential (primary) hypertension: Secondary | ICD-10-CM

## 2020-11-17 MED ORDER — METOPROLOL SUCCINATE ER 100 MG PO TB24: 100.0000 mg | ORAL_TABLET | Freq: Every day | ORAL | 0 refills | Status: DC

## 2020-11-17 NOTE — Telephone Encounter (Signed)
  Notes to clinic:  Requesting a 90 day script    Requested Prescriptions  Pending Prescriptions Disp Refills   metoprolol succinate (TOPROL-XL) 100 MG 24 hr tablet [Pharmacy Med Name: METOPROLOL SUCC ER 100 MG TAB] 90 tablet 1    Sig: TAKE 1 TABLET BY MOUTH DAILY. TAKE WITH OR IMMEDIATELY FOLLOWING A MEAL.      Cardiovascular:  Beta Blockers Failed - 11/17/2020  8:52 AM      Failed - Last BP in normal range    BP Readings from Last 1 Encounters:  07/31/20 (!) 130/100          Passed - Last Heart Rate in normal range    Pulse Readings from Last 1 Encounters:  07/31/20 76          Passed - Valid encounter within last 6 months    Recent Outpatient Visits           3 months ago Essential hypertension   [place], MD   4 months ago Depression, recurrent [facility])   [facility], MD   8 months ago [facility] Juline Patch, MD   11 months ago Essential hypertension   [facility] Jul[facility], MD   1 year ago Chronic anxiety   [facility] Juline Patch, MD

## 2020-11-25 DIAGNOSIS — M5030 Other cervical disc degeneration, unspecified cervical region: Secondary | ICD-10-CM | POA: Diagnosis not present

## 2020-11-25 DIAGNOSIS — G894 Chronic pain syndrome: Secondary | ICD-10-CM | POA: Diagnosis not present

## 2020-11-25 DIAGNOSIS — M545 Low back pain, unspecified: Secondary | ICD-10-CM | POA: Diagnosis not present

## 2020-12-06 DIAGNOSIS — G894 Chronic pain syndrome: Secondary | ICD-10-CM | POA: Diagnosis not present

## 2020-12-11 ENCOUNTER — Other Ambulatory Visit: Payer: Self-pay | Admitting: Family Medicine

## 2020-12-11 DIAGNOSIS — I10 Essential (primary) hypertension: Secondary | ICD-10-CM

## 2020-12-11 NOTE — Telephone Encounter (Signed)
Requested medications are due for refill today.  yes  Requested medications are on the active medications list.  yes  Last refill. 10/27/2020  Future visit scheduled.   no  Notes to clinic.  Patient was to return to office for BP check 6 weeks after appointment on 07/31/2020. Please advise.

## 2020-12-11 NOTE — Telephone Encounter (Signed)
Left voicemail to set up appointment

## 2020-12-19 ENCOUNTER — Other Ambulatory Visit: Payer: Self-pay | Admitting: Family Medicine

## 2020-12-19 DIAGNOSIS — I10 Essential (primary) hypertension: Secondary | ICD-10-CM

## 2020-12-19 IMAGING — MG DIGITAL DIAGNOSTIC BILAT W/ TOMO W/ CAD
6 of 12 series · 6 of 36 positions shown · non-contrast
Comparison: Previous exam(s).

CLINICAL DATA: 59-year-old female with history of Dercoms syndrome
presenting for numerous bilateral painful lumps.

EXAM:
DIGITAL DIAGNOSTIC BILATERAL MAMMOGRAM WITH CAD AND TOMO
ULTRASOUND BILATERAL BREAST

[L CC synth-2D]
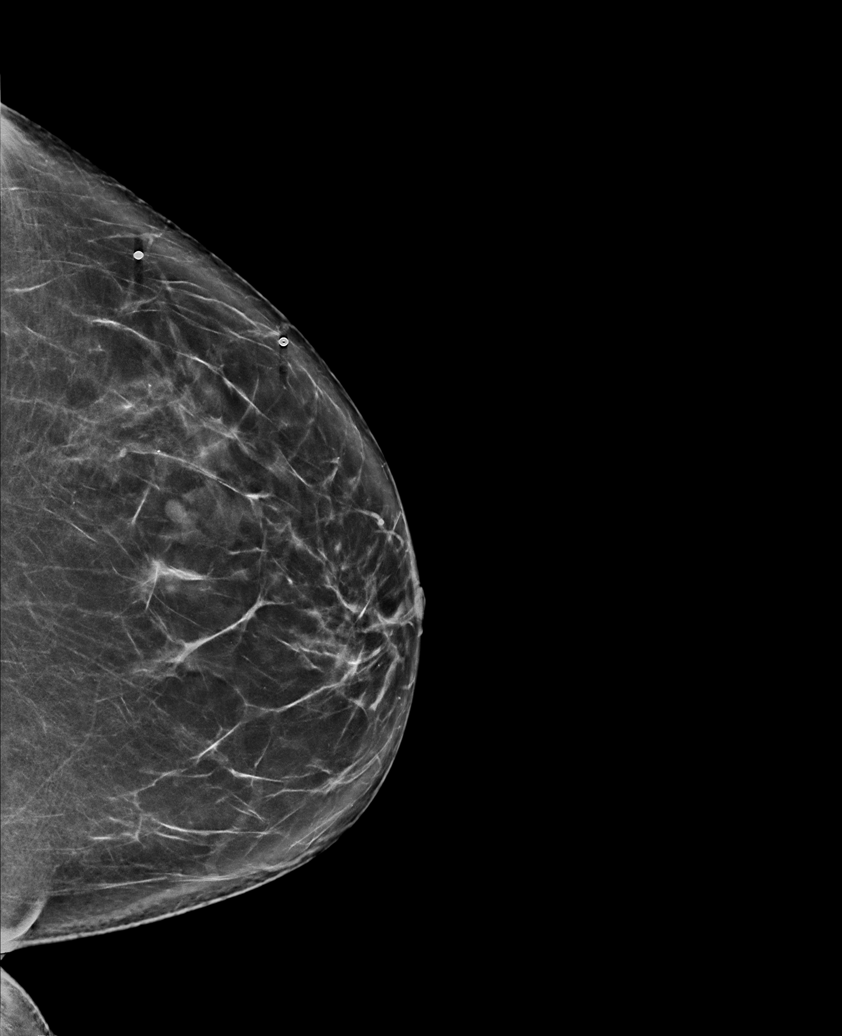

[R TAN synth-2D]
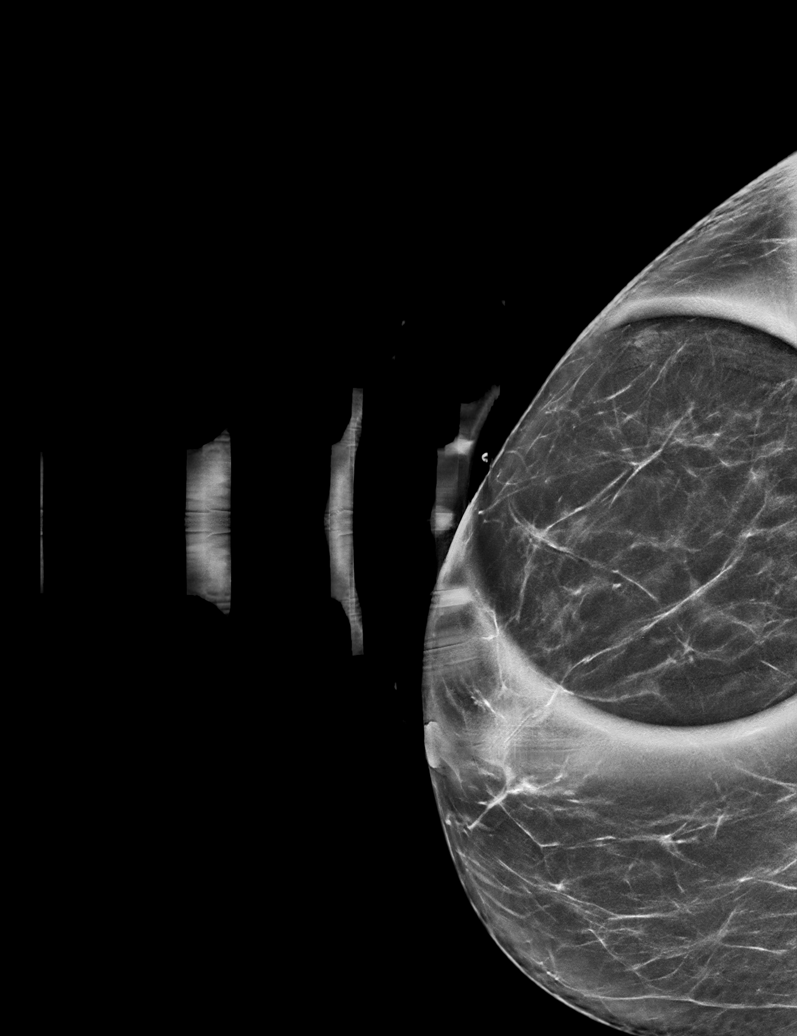

[L MLO synth-2D]
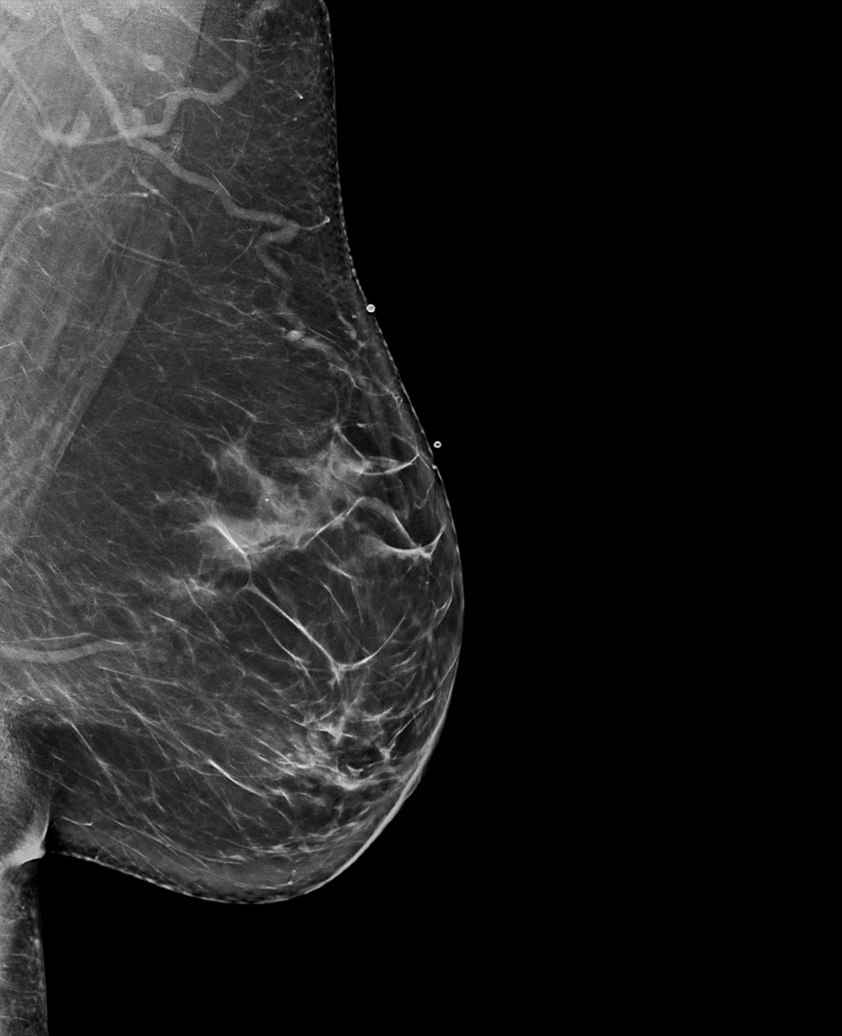

[R CC synth-2D]
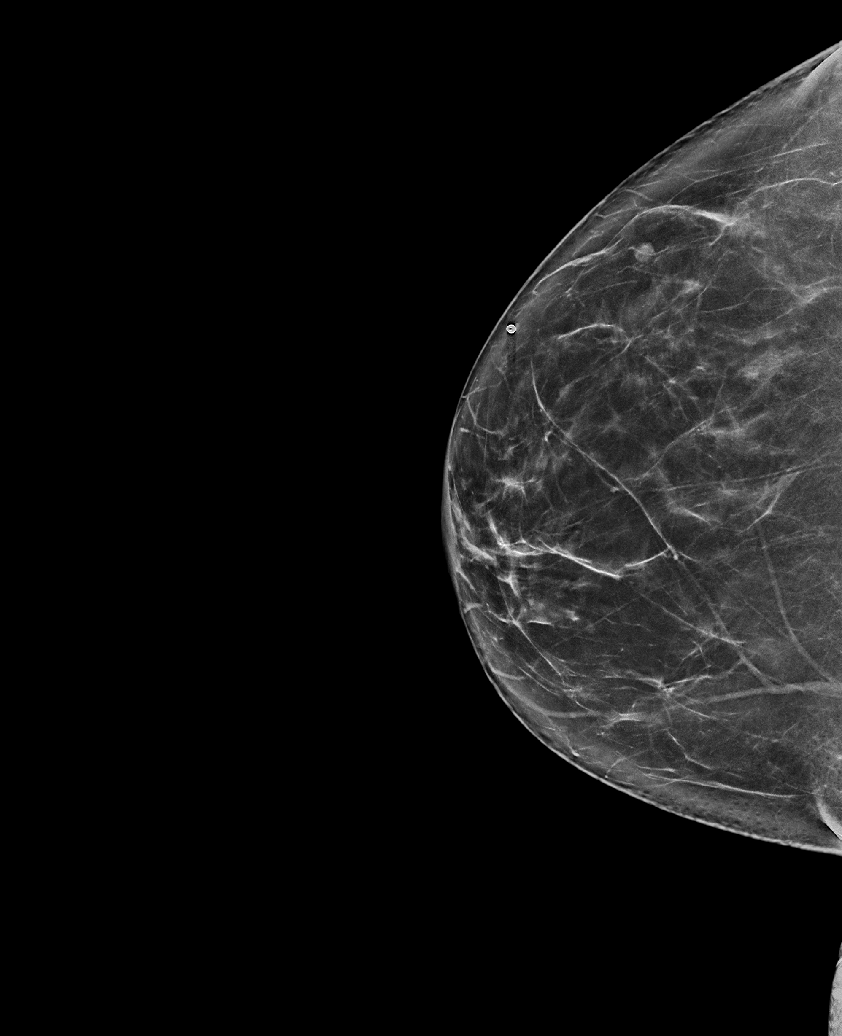

[R MLO synth-2D]
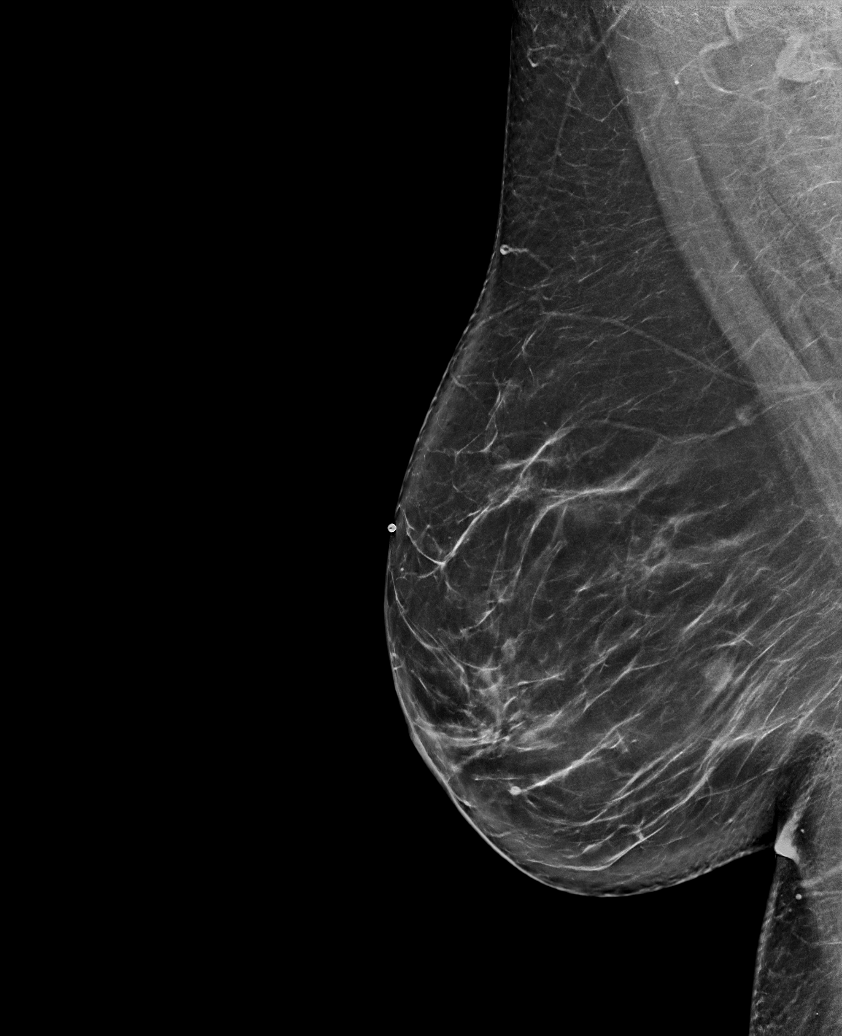

[L TAN synth-2D]
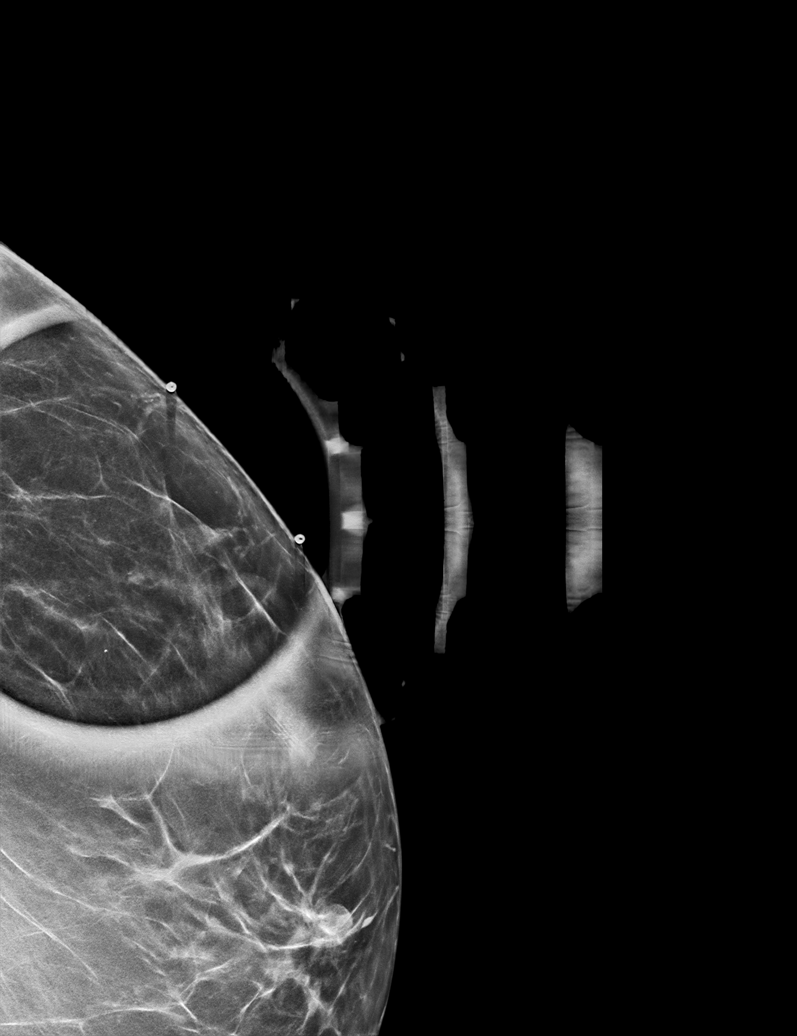

[6 of 36 positions shown; findings below may reference images not displayed]

ACR Breast Density Category b: There are scattered areas of
fibroglandular density.
FINDINGS: Mammogram:

Right breast: Spot compression tomosynthesis views were performed in
addition to standard views. A skin BB marks the palpable site of
concern reported by the patient in the upper-outer quadrant of the
right breast. No new abnormality is identified at the palpable site
or elsewhere in the left breast.

Left breast: Spot compression tomosynthesis views were performed in
addition to standard views. Two skin BBs mark the palpable sites of
concern reported by the patient in the upper-outer quadrant of the
left breast. There is no new abnormality at the palpable sites or
elsewhere in the left breast.

Mammographic images were processed with CAD.

On physical exam, I do not palpate a fixed discrete mass at the
sites of concern reported by the patient in the bilateral breasts.

Ultrasound:

Right breast:

Targeted ultrasound is performed at the palpable site of concern
reported by the patient at 10 o'clock 4 cm from the nipple
demonstrating no suspicious cystic or solid mass. There are multiple
fat lobules but no definite encapsulated mass to suggest a lipoma.
Incidentally noted are a few small normal intramammary lymph nodes
at 10 o'clock.

Left breast:

Targeted ultrasound is performed in the left breast at the palpable
sites of concern reported by the patient at 1 o'clock 6 cm from the
nipple and at 1 o'clock 10 cm from the nipple demonstrating no
discrete cystic or solid mass. There are multiple fat lobules
without a definite capsule to suggest a lipoma.
IMPRESSION: No mammographic or sonographic evidence of malignancy at the
bilateral palpable sites of concern. There is fat density in these
locations on mammogram and ultrasound but no definite encapsulated
mass to suggest a lipoma.

RECOMMENDATION:
1. Clinical follow-up as needed for the bilateral palpable areas of
concern.

2.  Screening mammogram in one year.(Code:[id number])

I have discussed the findings and recommendations with the patient.
If applicable, a reminder letter will be sent to the patient
regarding the next appointment.

BI-RADS CATEGORY  1: Negative.

## 2020-12-19 IMAGING — US US BREAST*L* LIMITED INC AXILLA
1 series · 9 of 9 positions shown · non-contrast
Comparison: Previous exam(s).

CLINICAL DATA: 59-year-old female with history of Dercoms syndrome
presenting for numerous bilateral painful lumps.

EXAM:
DIGITAL DIAGNOSTIC BILATERAL MAMMOGRAM WITH CAD AND TOMO
ULTRASOUND BILATERAL BREAST

[Series 1: us breast*left* limited inc axilla · 0.07mm/px · 9 of 9 slices shown]
[im 1/9]
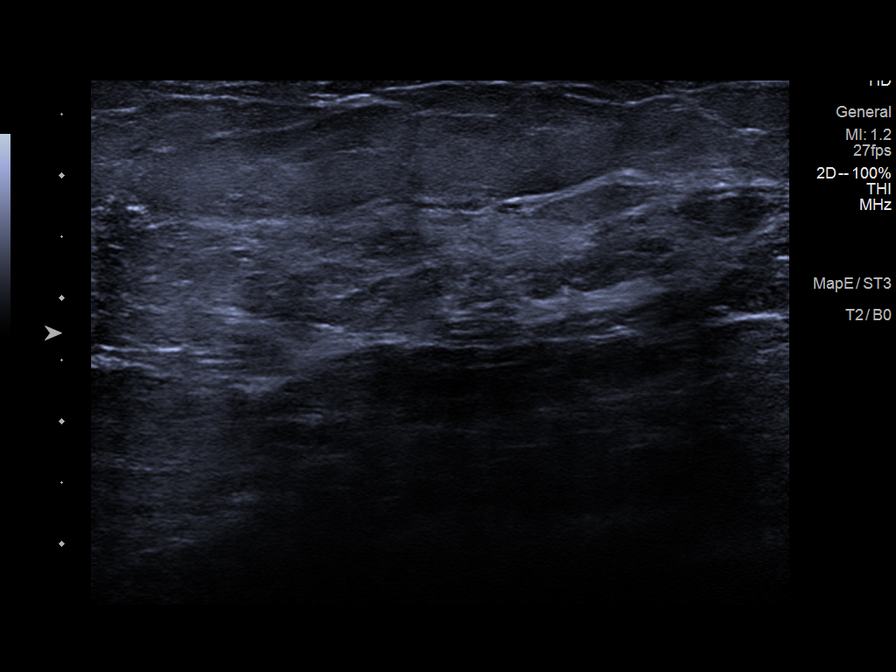
[im 2/9]
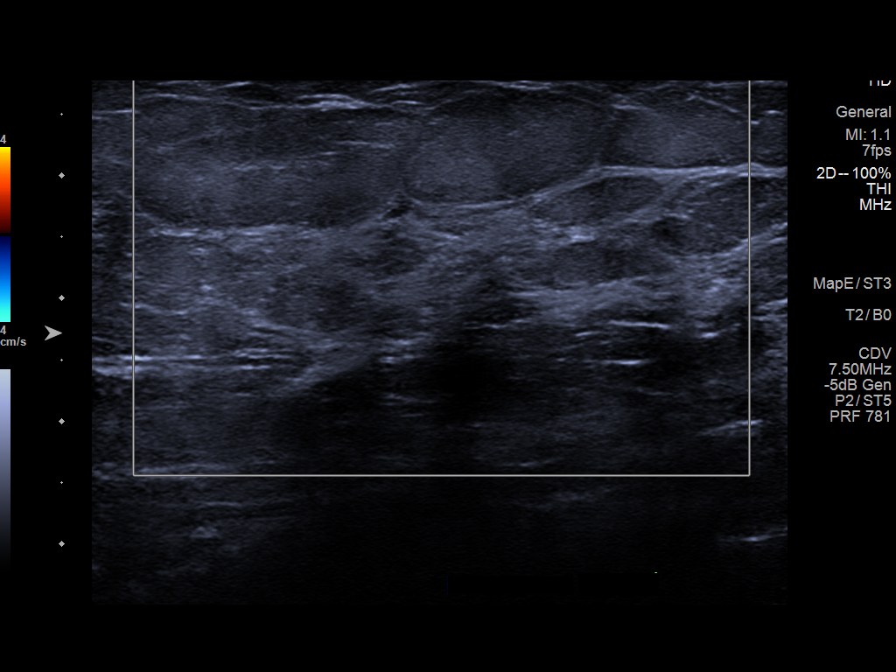
[im 3/9]
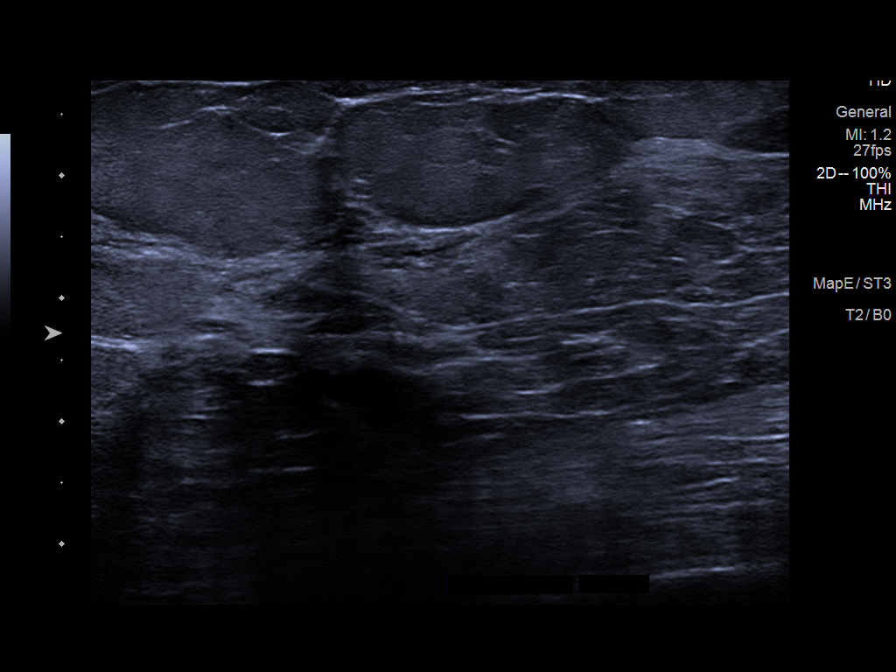
[im 4/9]
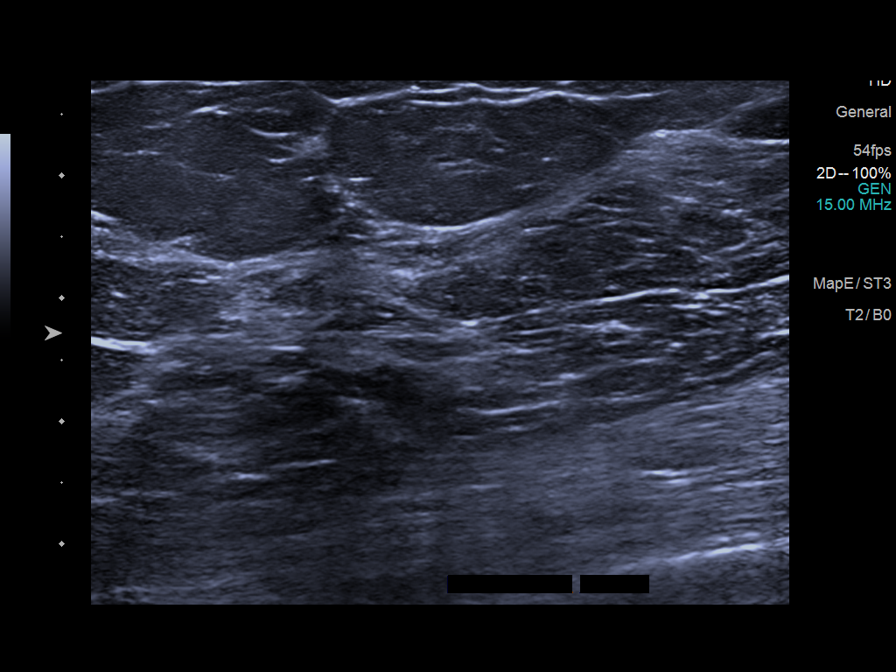
[im 5/9]
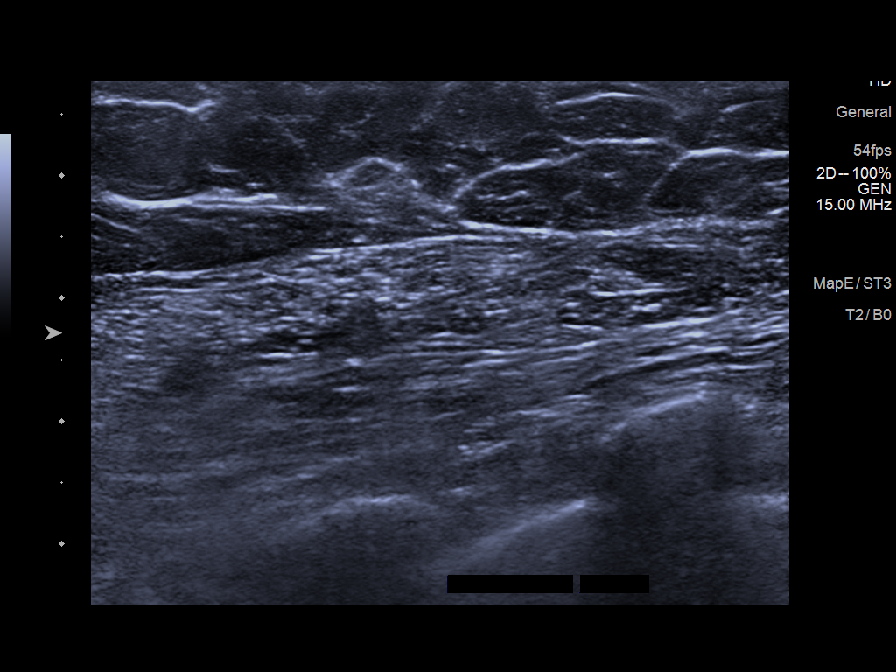
[im 6/9]
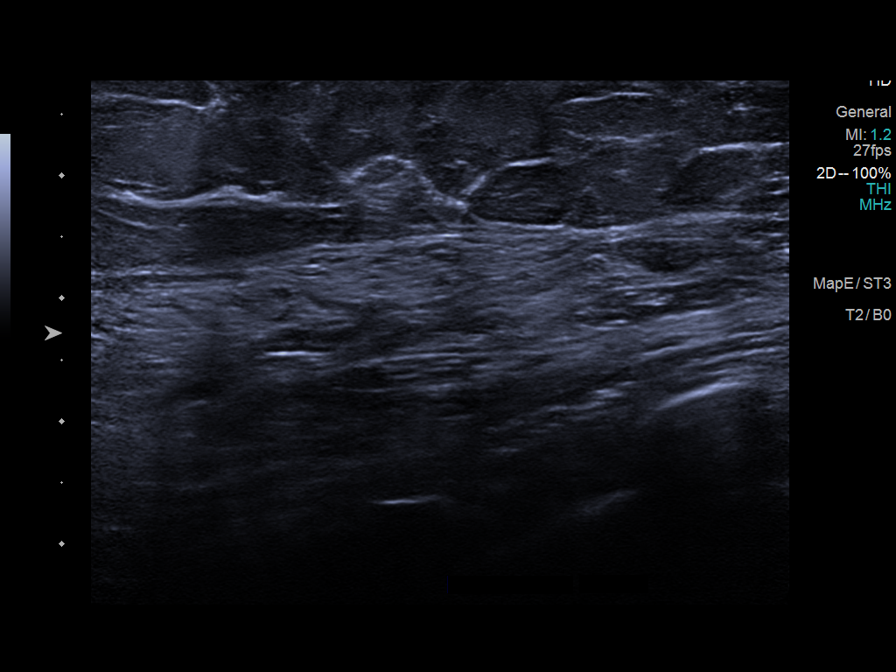
[im 7/9]
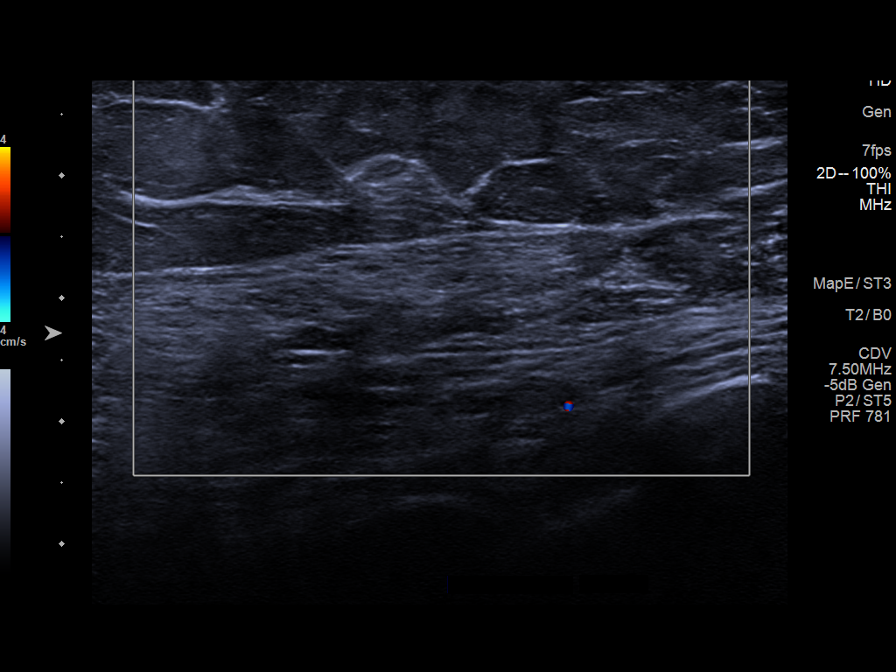
[im 8/9]
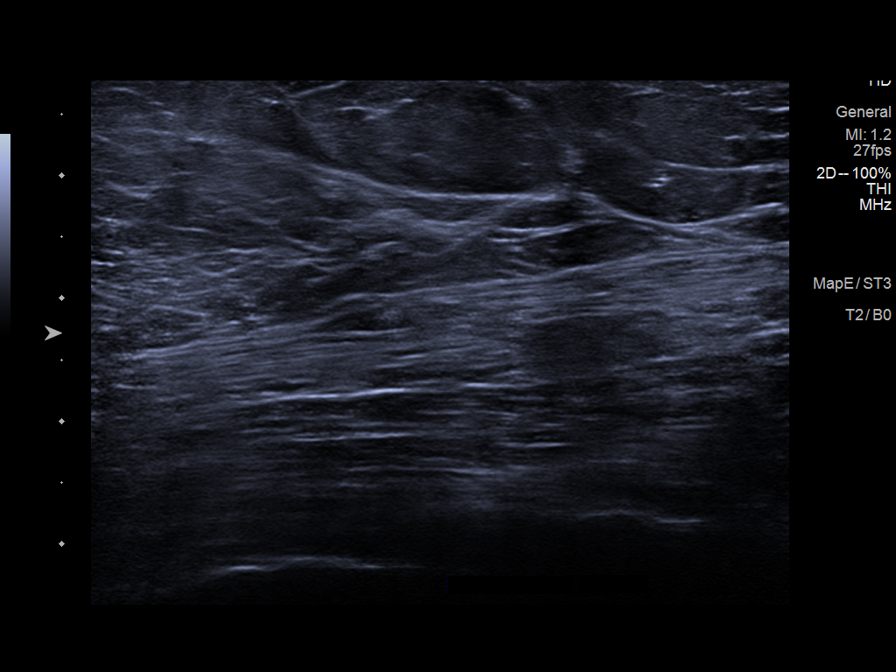
[im 9/9]
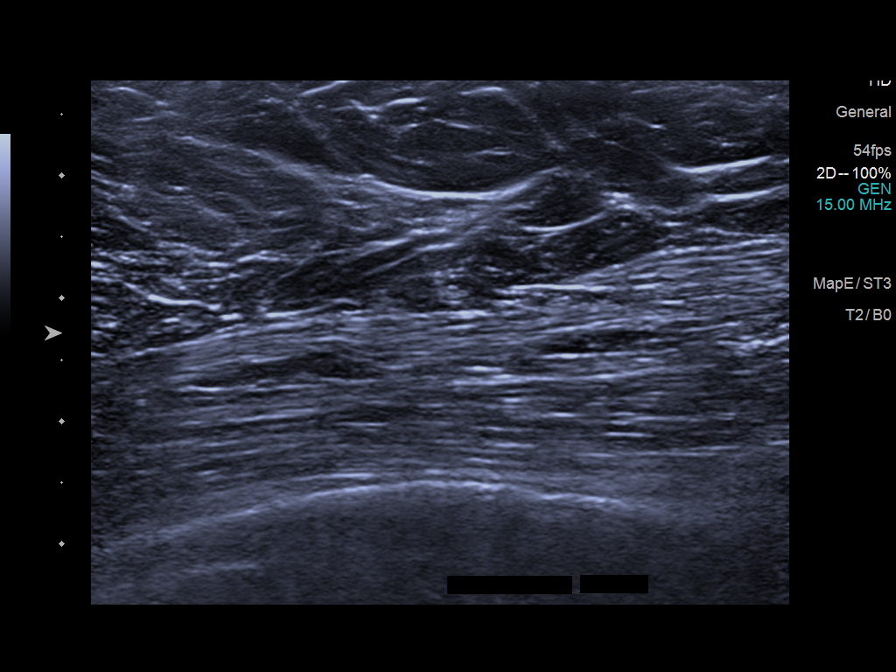

[9 of 9 positions shown; findings below may reference images not displayed]

ACR Breast Density Category b: There are scattered areas of
fibroglandular density.
FINDINGS: Mammogram:

Right breast: Spot compression tomosynthesis views were performed in
addition to standard views. A skin BB marks the palpable site of
concern reported by the patient in the upper-outer quadrant of the
right breast. No new abnormality is identified at the palpable site
or elsewhere in the left breast.

Left breast: Spot compression tomosynthesis views were performed in
addition to standard views. Two skin BBs mark the palpable sites of
concern reported by the patient in the upper-outer quadrant of the
left breast. There is no new abnormality at the palpable sites or
elsewhere in the left breast.

Mammographic images were processed with CAD.

On physical exam, I do not palpate a fixed discrete mass at the
sites of concern reported by the patient in the bilateral breasts.

Ultrasound:

Right breast:

Targeted ultrasound is performed at the palpable site of concern
reported by the patient at 10 o'clock 4 cm from the nipple
demonstrating no suspicious cystic or solid mass. There are multiple
fat lobules but no definite encapsulated mass to suggest a lipoma.
Incidentally noted are a few small normal intramammary lymph nodes
at 10 o'clock.

Left breast:

Targeted ultrasound is performed in the left breast at the palpable
sites of concern reported by the patient at 1 o'clock 6 cm from the
nipple and at 1 o'clock 10 cm from the nipple demonstrating no
discrete cystic or solid mass. There are multiple fat lobules
without a definite capsule to suggest a lipoma.
IMPRESSION: No mammographic or sonographic evidence of malignancy at the
bilateral palpable sites of concern. There is fat density in these
locations on mammogram and ultrasound but no definite encapsulated
mass to suggest a lipoma.

RECOMMENDATION:
1. Clinical follow-up as needed for the bilateral palpable areas of
concern.

2.  Screening mammogram in one year.(Code:[id number])

I have discussed the findings and recommendations with the patient.
If applicable, a reminder letter will be sent to the patient
regarding the next appointment.

BI-RADS CATEGORY  1: Negative.

## 2020-12-19 NOTE — Telephone Encounter (Signed)
  Notes to clinic: Patient was given courtesy refill and has not call to schedule appt [place] for refill    Requested Prescriptions  Pending Prescriptions Disp Refills   losartan (COZAAR) 25 MG tablet [Pharmacy Med Name: LOSARTAN POTASSIUM 25 MG TAB] 14 tablet 0    Sig: TAKE 1 TABLET (25 MG TOTAL) BY MOUTH DAILY.      Cardiovascular:  Angiotensin Receptor Blockers Failed - 12/19/2020  9:30 AM      Failed - Cr in normal range and within 180 days    Creatinine  Date Value Ref Range Status  10/02/2012 1.05 0.60 - 1.30 mg/dL Final   Creatinine, Ser  Date Value Ref Range Status  05/04/2019 0.78 0.57 - 1.00 mg/dL Final          Failed - K in normal range and within 180 days    Potassium  Date Value Ref Range Status  05/04/2019 4.7 3.5 - 5.2 mmol/L Final  10/02/2012 3.9 3.5 - 5.1 mmol/L Final          Failed - Last BP in normal range    BP Readings from Last 1 Encounters:  07/31/20 (!) 130/100          Passed - Patient is not pregnant      Passed - Valid encounter within last 6 months    Recent Outpatient Visits           4 months ago Essential hypertension   [employer], Deanna C, MD   5 months ago Depression, recurrent [facility])   [facility] Juline Patch, MD   9 months ago Folliculitis   [facility] Juline Patch, MD   1 year ago Essential hypertension   [facility] Juline Patch, MD   1 year ago Chronic anxiety   [facility] Juline Patch, MD

## 2020-12-30 ENCOUNTER — Other Ambulatory Visit: Payer: Self-pay | Admitting: Family Medicine

## 2020-12-30 DIAGNOSIS — I10 Essential (primary) hypertension: Secondary | ICD-10-CM

## 2020-12-31 NOTE — Telephone Encounter (Signed)
Elevated reading last visit need to see

## 2021-01-05 DIAGNOSIS — M545 Low back pain, unspecified: Secondary | ICD-10-CM | POA: Diagnosis not present

## 2021-01-05 DIAGNOSIS — M5030 Other cervical disc degeneration, unspecified cervical region: Secondary | ICD-10-CM | POA: Diagnosis not present

## 2021-01-05 DIAGNOSIS — G894 Chronic pain syndrome: Secondary | ICD-10-CM | POA: Diagnosis not present

## 2021-01-07 DIAGNOSIS — G8929 Other chronic pain: Secondary | ICD-10-CM | POA: Diagnosis not present

## 2021-01-07 DIAGNOSIS — M9951 Intervertebral disc stenosis of neural canal of cervical region: Secondary | ICD-10-CM | POA: Diagnosis not present

## 2021-01-07 DIAGNOSIS — M1990 Unspecified osteoarthritis, unspecified site: Secondary | ICD-10-CM | POA: Diagnosis not present

## 2021-01-07 DIAGNOSIS — M4807 Spinal stenosis, lumbosacral region: Secondary | ICD-10-CM | POA: Diagnosis not present

## 2021-01-07 DIAGNOSIS — G894 Chronic pain syndrome: Secondary | ICD-10-CM | POA: Diagnosis not present

## 2021-01-07 DIAGNOSIS — M47897 Other spondylosis, lumbosacral region: Secondary | ICD-10-CM | POA: Diagnosis not present

## 2021-01-07 DIAGNOSIS — M9941 Connective tissue stenosis of neural canal of cervical region: Secondary | ICD-10-CM | POA: Diagnosis not present

## 2021-01-07 DIAGNOSIS — M545 Low back pain, unspecified: Secondary | ICD-10-CM | POA: Diagnosis not present

## 2021-01-07 DIAGNOSIS — M25559 Pain in unspecified hip: Secondary | ICD-10-CM | POA: Diagnosis not present

## 2021-01-07 DIAGNOSIS — M5030 Other cervical disc degeneration, unspecified cervical region: Secondary | ICD-10-CM | POA: Diagnosis not present

## 2021-01-07 DIAGNOSIS — M4726 Other spondylosis with radiculopathy, lumbar region: Secondary | ICD-10-CM | POA: Diagnosis not present

## 2021-01-07 DIAGNOSIS — M25519 Pain in unspecified shoulder: Secondary | ICD-10-CM | POA: Diagnosis not present

## 2021-01-07 DIAGNOSIS — R519 Headache, unspecified: Secondary | ICD-10-CM | POA: Diagnosis not present

## 2021-01-07 DIAGNOSIS — M79609 Pain in unspecified limb: Secondary | ICD-10-CM | POA: Diagnosis not present

## 2021-01-07 DIAGNOSIS — M48062 Spinal stenosis, lumbar region with neurogenic claudication: Secondary | ICD-10-CM | POA: Diagnosis not present

## 2021-01-07 DIAGNOSIS — M792 Neuralgia and neuritis, unspecified: Secondary | ICD-10-CM | POA: Diagnosis not present

## 2021-01-10 ENCOUNTER — Other Ambulatory Visit: Payer: Self-pay | Admitting: Family Medicine

## 2021-01-10 DIAGNOSIS — G43C0 Periodic headache syndromes in child or adult, not intractable: Secondary | ICD-10-CM

## 2021-01-10 NOTE — Telephone Encounter (Signed)
Requested medication (s) are due for refill today Yes  Requested medication (s) are on the active medication list Yes  Future visit scheduled No.  Last OV 07/31/20    Last ordered 07/07/20  Note to clinic-Not delegated    Requested Prescriptions  Pending Prescriptions Disp Refills   topiramate (TOPAMAX) 50 MG tablet [Pharmacy Med Name: TOPIRAMATE 50 MG TABLET] 180 tablet 1    Sig: TAKE 1 TABLET BY MOUTH TWICE A DAY      Not Delegated - Neurology: Anticonvulsants - topiramate & zonisamide Failed - 01/10/2021  9:25 AM      Failed - This refill cannot be delegated      Failed - Cr in normal range and within 360 days    Creatinine  Date Value Ref Range Status  10/02/2012 1.05 0.60 - 1.30 mg/dL Final   Creatinine, Ser  Date Value Ref Range Status  05/04/2019 0.78 0.57 - 1.00 mg/dL Final          Failed - CO2 in normal range and within 360 days    CO2  Date Value Ref Range Status  05/04/2019 25 20 - 29 mmol/L Final   Co2  Date Value Ref Range Status  10/02/2012 22 21 - 32 mmol/L Final          Passed - Valid encounter within last 12 months    Recent Outpatient Visits           5 months ago Essential hypertension   [place], Deanna C, MD   6 months ago Depression, recurrent [facility])   [facility] Juline Patch, MD   9 months ago Folliculitis   [facility] Juline Patch, MD   1 year ago Essential hypertension   [facility] Juline Patch, MD   1 year ago Chronic anxiety   [facility] Juline Patch, MD

## 2021-01-29 ENCOUNTER — Other Ambulatory Visit: Payer: Self-pay | Admitting: Family Medicine

## 2021-01-29 DIAGNOSIS — I10 Essential (primary) hypertension: Secondary | ICD-10-CM

## 2021-01-29 NOTE — Telephone Encounter (Signed)
  Notes to clinic:  Patient has appt on 02/02/2021 Review for refill    Requested Prescriptions  Pending Prescriptions Disp Refills   losartan (COZAAR) 25 MG tablet [Pharmacy Med Name: LOSARTAN POTASSIUM 25 MG TAB] 30 tablet 1    Sig: TAKE 1 TABLET (25 MG TOTAL) BY MOUTH DAILY.      Cardiovascular:  Angiotensin Receptor Blockers Failed - 01/29/2021  9:33 AM      Failed - Cr in normal range and within 180 days    Creatinine  Date Value Ref Range Status  10/02/2012 1.05 0.60 - 1.30 mg/dL Final   Creatinine, Ser  Date Value Ref Range Status  05/04/2019 0.78 0.57 - 1.00 mg/dL Final          Failed - K in normal range and within 180 days    Potassium  Date Value Ref Range Status  05/04/2019 4.7 3.5 - 5.2 mmol/L Final  10/02/2012 3.9 3.5 - 5.1 mmol/L Final          Failed - Last BP in normal range    BP Readings from Last 1 Encounters:  07/31/20 (!) 130/100          Failed - Valid encounter within last 6 months    Recent Outpatient Visits           6 months ago Essential hypertension   [place], MD   6 months ago Depression, recurrent [facility])   [facility] Jul[facility], [facility]   10 months ago [place], MD   1 year ago Essential hypertension   [facility] Juline Patch, MD   1 year ago Chronic anxiety   [facility] Juline Patch, MD       Future Appointments             In 4 days Juline Patch, MD [facility], [place] - Patient is not pregnant

## 2021-02-02 ENCOUNTER — Other Ambulatory Visit: Payer: Self-pay

## 2021-02-02 ENCOUNTER — Ambulatory Visit (INDEPENDENT_AMBULATORY_CARE_PROVIDER_SITE_OTHER): Payer: Medicare Other | Admitting: Family Medicine

## 2021-02-02 ENCOUNTER — Encounter: Payer: Self-pay | Admitting: Family Medicine

## 2021-02-02 VITALS — BP 138/80 | HR 61 | Ht 70.0 in | Wt 190.0 lb

## 2021-02-02 DIAGNOSIS — E559 Vitamin D deficiency, unspecified: Secondary | ICD-10-CM | POA: Diagnosis not present

## 2021-02-02 DIAGNOSIS — E782 Mixed hyperlipidemia: Secondary | ICD-10-CM | POA: Diagnosis not present

## 2021-02-02 DIAGNOSIS — F339 Major depressive disorder, recurrent, unspecified: Secondary | ICD-10-CM | POA: Diagnosis not present

## 2021-02-02 DIAGNOSIS — I10 Essential (primary) hypertension: Secondary | ICD-10-CM

## 2021-02-02 DIAGNOSIS — F419 Anxiety disorder, unspecified: Secondary | ICD-10-CM

## 2021-02-02 MED ORDER — SIMVASTATIN 80 MG PO TABS: ORAL_TABLET | ORAL | 1 refills | Status: DC

## 2021-02-02 MED ORDER — VITAMIN D (ERGOCALCIFEROL) 1.25 MG (50000 UNIT) PO CAPS: ORAL_CAPSULE | ORAL | 1 refills | Status: DC

## 2021-02-02 MED ORDER — BUSPIRONE HCL 30 MG PO TABS: 30.0000 mg | ORAL_TABLET | Freq: Two times a day (BID) | ORAL | 1 refills | Status: DC

## 2021-02-02 MED ORDER — METOPROLOL SUCCINATE ER 100 MG PO TB24: 100.0000 mg | ORAL_TABLET | Freq: Every day | ORAL | 1 refills | Status: DC

## 2021-02-02 MED ORDER — BUPROPION HCL ER (XL) 150 MG PO TB24: ORAL_TABLET | ORAL | 1 refills | Status: DC

## 2021-02-02 MED ORDER — LOSARTAN POTASSIUM 25 MG PO TABS: 25.0000 mg | ORAL_TABLET | Freq: Every day | ORAL | 1 refills | Status: DC

## 2021-02-02 MED ORDER — EZETIMIBE 10 MG PO TABS: 10.0000 mg | ORAL_TABLET | Freq: Every day | ORAL | 1 refills | Status: DC

## 2021-02-02 NOTE — Progress Notes (Signed)
Date:  02/02/2021   Name:  Jamie Kim   DOB:  11-09-1960   MRN:  [id number]   Chief Complaint: No chief complaint on file.  Hypertension This is a chronic problem. The current episode started more than 1 year ago. The problem has been gradually improving since onset. The problem is controlled. Associated symptoms include anxiety. Pertinent negatives include no blurred vision, chest pain, headaches, malaise/fatigue, neck pain, orthopnea, palpitations, peripheral edema, PND, shortness of breath or sweats. There are no associated agents to hypertension. Past treatments include angiotensin blockers and beta blockers. The current treatment provides moderate improvement. There are no compliance problems.  There is no history of angina, kidney disease, CAD/MI, CVA, heart failure, left ventricular hypertrophy, PVD or retinopathy. There is no history of chronic renal disease, a hypertension causing med or renovascular disease.  Hyperlipidemia This is a chronic problem. The current episode started more than 1 year ago. The problem is controlled. Recent lipid tests were reviewed and are normal. She has no history of chronic renal disease, diabetes, hypothyroidism, liver disease, obesity or nephrotic syndrome. There are no known factors aggravating her hyperlipidemia. Pertinent negatives include no chest pain, focal sensory loss, focal weakness, leg pain, myalgias or shortness of breath. Current antihyperlipidemic treatment includes ezetimibe and statins. The current treatment provides moderate improvement of lipids. There are no compliance problems.   Anxiety Presents for follow-up visit. Patient reports no chest pain, compulsions, confusion, decreased concentration, depressed mood, dizziness, dry mouth, excessive worry, feeling of choking, hyperventilation, impotence, insomnia, irritability, malaise, muscle tension, nausea, nervous/anxious behavior, obsessions, palpitations, panic, restlessness,  shortness of breath or suicidal ideas. Symptoms occur occasionally. The severity of symptoms is mild.    Depression        This is a chronic problem.  The current episode started more than 1 year ago.   The onset quality is gradual.   The problem occurs intermittently.  Associated symptoms include no decreased concentration, no fatigue, no helplessness, no hopelessness, does not have insomnia, not irritable, no restlessness, no decreased interest, no appetite change, no body aches, no myalgias, no headaches, no indigestion, not sad and no suicidal ideas.  Past treatments include other medications.  Compliance with treatment is variable.  Previous treatment provided moderate relief.  Past medical history includes anxiety.     Pertinent negatives include no hypothyroidism.  Lab Results  Component Value Date   CREATININE 0.78 05/04/2019   BUN 11 05/04/2019   NA 141 05/04/2019   K 4.7 05/04/2019   CL 104 05/04/2019   CO2 25 05/04/2019   Lab Results  Component Value Date   CHOL 160 05/04/2019   HDL 57 05/04/2019   LDLCALC 68 05/04/2019   TRIG 212 (H) 05/04/2019   CHOLHDL 2.5 11/02/2018   Lab Results  Component Value Date   TSH 1.690 12/07/2019   No results found for: HGBA1C Lab Results  Component Value Date   WBC 4.9 11/20/2012   HGB 12.2 11/20/2012   HCT 36.0 11/20/2012   MCV 91 11/20/2012   PLT 396 (H) 11/20/2012   Lab Results  Component Value Date   ALT 13 11/02/2018   AST 17 11/02/2018   ALKPHOS 68 11/02/2018   BILITOT <0.2 11/02/2018     Review of Systems  Constitutional:  Negative for appetite change, chills, fatigue, fever, irritability and malaise/fatigue.  HENT:  Negative for drooling, ear discharge, ear pain and sore throat.   Eyes:  Negative for blurred vision.  Respiratory:  Negative for cough, shortness of breath and wheezing.   Cardiovascular:  Negative for chest pain, palpitations, orthopnea, leg swelling and PND.  Gastrointestinal:  Negative for abdominal  pain, blood in stool, constipation, diarrhea and nausea.  Endocrine: Negative for polydipsia.  Genitourinary:  Negative for dysuria, frequency, hematuria, impotence and urgency.  Musculoskeletal:  Negative for back pain, myalgias and neck pain.  Skin:  Negative for rash.  Allergic/Immunologic: Negative for environmental allergies.  Neurological:  Negative for dizziness, focal weakness and headaches.  Hematological:  Does not bruise/bleed easily.  Psychiatric/Behavioral:  Positive for depression. Negative for confusion, decreased concentration and suicidal ideas. The patient is not nervous/anxious and does not have insomnia.    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   Family history of malignant neoplasm of gastrointestinal tract    Benign neoplasm of ascending colon    Benign neoplasm of transverse colon    Polyp of sigmoid colon    Gastric polyp    Status post Nissen fundoplication (without gastrostomy tube) procedure    Esophageal dysphagia    Heartburn    Essential hypertension 09/03/2016   Periodic headache syndrome, not intractable 09/03/2016   Gastroesophageal reflux disease 09/03/2016   Multiple thyroid nodules 09/03/2016   Vitamin D deficiency 09/03/2016   Chronic anxiety 09/03/2016   Depression, recurrent ([place]) 09/03/2016   DDD (degenerative disc disease), lumbar 02/26/2015   Bilateral occipital neuralgia 01/27/2015   Spinal stenosis of lumbar region 11/25/2014   DDD (degenerative disc disease), cervical 11/24/2014   Status post cervical spinal fusion 11/24/2014   Lumbosacral facet joint syndrome 11/24/2014   Sacroiliac joint dysfunction of both sides 11/24/2014   Greater trochanteric bursitis of both hips 11/24/2014   Cervical facet syndrome 11/24/2014   Hyperlipidemia    Chest pain 10/26/2012   Palpitations 10/26/2012    Allergies  Allergen Reactions   Codeine Itching   Hydrocodone Itching   Morphine Itching   Morphine  And Related    Oxybutynin Itching   Oxycodone Itching   Tramadol-Acetaminophen Itching    Past Surgical History:  Procedure Laterality Date   ANTERIOR CERVICAL DECOMP/DISCECTOMY FUSION     BLADDER SUSPENSION     CARDIAC CATHETERIZATION  11/23/12   [facility];EF 65%   CARPAL TUNNEL RELEASE Bilateral    CHOLECYSTECTOMY     COLONOSCOPY WITH PROPOFOL N/A 06/13/2018   Procedure: COLONOSCOPY WITH BIOPSIES;  Surgeon: Virgel Manifold, MD;  Location: [place];  Service: Endoscopy;  Laterality: N/A;   ESOPHAGOGASTRODUODENOSCOPY (EGD) WITH PROPOFOL N/A 06/13/2018   Procedure: ESOPHAGOGASTRODUODENOSCOPY (EGD) WITH BIOPSIES;  Surgeon: Virgel Manifold, MD;  Location: [place];  Service: Endoscopy;  Laterality: N/A;   HERNIA REPAIR     KNEE SURGERY Left    LAPAROSCOPY     NECK SURGERY     NISSEN FUNDOPLICATION     POLYPECTOMY N/A 06/13/2018   Procedure: POLYPECTOMY;  Surgeon: Virgel Manifold, MD;  Location: [place];  Service: Endoscopy;  Laterality: N/A;   TONSILLECTOMY     TOTAL ABDOMINAL HYSTERECTOMY      Social History   Tobacco Use   Smoking status: Former    Packs/day: 0.50    Years: 25.00    Pack years: 12.50    Types: Cigarettes    Quit date: 2018    Years since quitting: 4.5   Smokeless tobacco: Never  Vaping Use   Vaping Use: Never used  Substance Use Topics   Alcohol use:  No    Alcohol/week: 0.0 standard drinks    Comment: 1 month   Drug use: No     Medication list has been reviewed and updated.  No outpatient medications have been marked as taking for the 02/02/21 encounter (Appointment) with Juline Patch, MD.   Current Facility-Administered Medications for the 02/02/21 encounter (Appointment) with Juline Patch, MD  Medication   bupivacaine (MARCAINE) 0.5 % injection 30 mL   bupivacaine (PF) (MARCAINE) 0.25 % injection 30 mL   bupivacaine (PF) (MARCAINE) 0.25 % injection 30 mL   fentaNYL (SUBLIMAZE) injection 100 mcg    fentaNYL (SUBLIMAZE) injection 100 mcg   fentaNYL (SUBLIMAZE) injection 100 mcg   lactated ringers infusion 1,000 mL   lactated ringers infusion 1,000 mL   lactated ringers infusion 1,000 mL   lactated ringers infusion 1,000 mL   lidocaine (PF) (XYLOCAINE) 1 % injection 10 mL   midazolam (VERSED) 5 MG/5ML injection 5 mg   midazolam (VERSED) 5 MG/5ML injection 5 mg   midazolam (VERSED) 5 MG/5ML injection 5 mg   orphenadrine (NORFLEX) injection 60 mg   orphenadrine (NORFLEX) injection 60 mg   orphenadrine (NORFLEX) injection 60 mg   sodium chloride 0.9 % injection 20 mL   triamcinolone acetonide (KENALOG-40) injection 40 mg   triamcinolone acetonide (KENALOG-40) injection 40 mg   triamcinolone acetonide (KENALOG-40) injection 40 mg    PHQ 2/9 Scores 07/07/2020 03/19/2020 12/07/2019 05/04/2019  PHQ - 2 Score 0 0 0 0  PHQ- 9 Score 0 0 0 0  Exception Documentation - - - -    GAD 7 : Generalized Anxiety Score 03/19/2020 12/07/2019 05/04/2019  Nervous, Anxious, on Edge 0 0 0  Control/stop worrying 0 0 0  Worry too much - different things 0 0 0  Trouble relaxing 0 0 0  Restless 0 0 0  Easily annoyed or irritable 0 0 0  Afraid - awful might happen 0 0 0  Total GAD 7 Score 0 0 0  Anxiety Difficulty - Not difficult at all -    BP Readings from Last 3 Encounters:  07/31/20 (!) 130/100  07/07/20 (!) 150/110  03/19/20 (!) 130/94    Physical Exam Vitals and nursing note reviewed.  Constitutional:      General: She is not irritable.    Appearance: She is well-developed.  HENT:     Head: Normocephalic.     Right Ear: Tympanic membrane, ear canal and external ear normal. There is no impacted cerumen.     Left Ear: Tympanic membrane, ear canal and external ear normal. There is no impacted cerumen.     Nose: No congestion or rhinorrhea.     Mouth/Throat:     Mouth: Mucous membranes are moist.  Eyes:     General: Lids are everted, no foreign bodies appreciated. No scleral icterus.        Left eye: No foreign body or hordeolum.     Conjunctiva/sclera: Conjunctivae normal.     Right eye: Right conjunctiva is not injected.     Left eye: Left conjunctiva is not injected.     Pupils: Pupils are equal, round, and reactive to light.  Neck:     Thyroid: No thyromegaly.     Vascular: No carotid bruit or JVD.     Trachea: No tracheal deviation.  Cardiovascular:     Rate and Rhythm: Normal rate and regular rhythm.     Heart sounds: Normal heart sounds. No murmur heard.   No friction  rub. No gallop.  Pulmonary:     Effort: Pulmonary effort is normal. No respiratory distress.     Breath sounds: Normal breath sounds. No wheezing, rhonchi or rales.  Chest:     Chest wall: No tenderness.  Abdominal:     General: Bowel sounds are normal.     Palpations: Abdomen is soft. There is no mass.     Tenderness: There is no abdominal tenderness. There is no right CVA tenderness, left CVA tenderness, guarding or rebound.  Musculoskeletal:        General: No tenderness. Normal range of motion.     Cervical back: Normal range of motion and neck supple.  Lymphadenopathy:     Cervical: No cervical adenopathy.  Skin:    General: Skin is warm.     Findings: No rash.  Neurological:     Mental Status: She is alert and oriented to person, place, and time.     Cranial Nerves: No cranial nerve deficit.     Gait: Gait normal.     Deep Tendon Reflexes: Reflexes normal.  Psychiatric:        Mood and Affect: Mood is not anxious or depressed.    Wt Readings from Last 3 Encounters:  07/31/20 188 lb (85.3 kg)  07/07/20 192 lb (87.1 kg)  03/19/20 194 lb (88 kg)    There were no vitals taken for this visit.  Assessment and Plan:  1. Essential hypertension Chronic.  Controlled.  Stable.  Blood pressure today is 138/80.  Controlled with losartan 25 mg once a day.  Will check renal function panel. - losartan (COZAAR) 25 MG tablet; Take 1 tablet (25 mg total) by mouth daily.  Dispense: 90 tablet;  Refill: 1 - metoprolol succinate (TOPROL-XL) 100 MG 24 hr tablet; Take 1 tablet (100 mg total) by mouth daily. TAKE WITH OR IMMEDIATELY FOLLOWING A MEAL.  Dispense: 90 tablet; Refill: 1 - Renal Function Panel  2. Mixed hyperlipidemia Chronic.  Controlled.  Stable.  Continue Zetia 10 mg once a day and simvastatin 80 mg once a day.  Will check lipid panel. - ezetimibe (ZETIA) 10 MG tablet; Take 1 tablet (10 mg total) by mouth daily. Reported on 10/15/2015  Dispense: 90 tablet; Refill: 1 - simvastatin (ZOCOR) 80 MG tablet; TAKE 1 TABLET BY MOUTH EVERY DAY  Dispense: 90 tablet; Refill: 1 - Lipid Panel With LDL/HDL Ratio  3. Depression, recurrent ([place]) .  Controlled.  Stable.  PHQ is 0.  Continue buspirone 30 mg 1 twice a day. - buPROPion (WELLBUTRIN XL) 150 MG 24 hr tablet; TAKE 1 TABLET BY MOUTH EVERY DAY IN THE MORNING  Dispense: 90 tablet; Refill: 1  4. Chronic anxiety .  Controlled.  Stable.  Gad score is 0.  Continue buspirone 30 mg 1 tablet twice a day. - busPIRone (BUSPAR) 30 MG tablet; Take 1 tablet (30 mg total) by mouth 2 (two) times daily.  Dispense: 180 tablet; Refill: 1  5. Vitamin D deficiency Chronic.  Controlled.  Stable.  Continue current supplementation with vitamin D 1.25 mg capsule 1 capsule every 7 days. - Vitamin D, Ergocalciferol, (DRISDOL) 1.25 MG (50000 UNIT) CAPS capsule; Take 1 capsule every 7 days.  Dispense: 12 capsule; Refill: 1

## 2021-02-03 LAB — RENAL FUNCTION PANEL
Albumin: 4.5 g/dL (ref 3.8–4.9)
BUN/Creatinine Ratio: 15 (ref 9–23)
BUN: 12 mg/dL (ref 6–24)
CO2: 22 mmol/L (ref 20–29)
Calcium: 9.4 mg/dL (ref 8.7–10.2)
Chloride: 106 mmol/L (ref 96–106)
Creatinine, Ser: 0.78 mg/dL (ref 0.57–1.00)
Glucose: 95 mg/dL (ref 65–99)
Phosphorus: 4.7 mg/dL — ABNORMAL HIGH (ref 3.0–4.3)
Potassium: 4.6 mmol/L (ref 3.5–5.2)
Sodium: 139 mmol/L (ref 134–144)
eGFR: 87 mL/min/{1.73_m2} (ref >59)

## 2021-02-03 LAB — LIPID PANEL WITH LDL/HDL RATIO
Cholesterol, Total: 163 mg/dL (ref 100–199)
HDL: 58 mg/dL (ref >39)
LDL Chol Calc (NIH): 68 mg/dL (ref 0–99)
LDL/HDL Ratio: 1.2 ratio (ref 0.0–3.2)
Triglycerides: 226 mg/dL — ABNORMAL HIGH (ref 0–149)
VLDL Cholesterol Cal: 37 mg/dL (ref 5–40)

## 2021-02-04 DIAGNOSIS — M545 Low back pain, unspecified: Secondary | ICD-10-CM | POA: Diagnosis not present

## 2021-02-04 DIAGNOSIS — G894 Chronic pain syndrome: Secondary | ICD-10-CM | POA: Diagnosis not present

## 2021-02-04 DIAGNOSIS — M5030 Other cervical disc degeneration, unspecified cervical region: Secondary | ICD-10-CM | POA: Diagnosis not present

## 2021-02-08 ENCOUNTER — Other Ambulatory Visit: Payer: Self-pay | Admitting: Family Medicine

## 2021-02-08 DIAGNOSIS — G43C0 Periodic headache syndromes in child or adult, not intractable: Secondary | ICD-10-CM

## 2021-02-08 NOTE — Telephone Encounter (Signed)
Requested medication (s) are due for refill today: yes  Requested medication (s) are on the active medication list: yes  Last refill:  01/12/21 #60  Future visit scheduled: yes  Notes to clinic:  med not delegated to NT to RF   Requested Prescriptions  Pending Prescriptions Disp Refills   topiramate (TOPAMAX) 50 MG tablet [Pharmacy Med Name: TOPIRAMATE 50 MG TABLET] 60 tablet 0    Sig: TAKE 1 TABLET BY MOUTH TWICE A DAY      Not Delegated - Neurology: Anticonvulsants - topiramate & zonisamide Failed - 02/08/2021 11:32 AM      Failed - This refill cannot be delegated      Passed - Cr in normal range and within 360 days    Creatinine  Date Value Ref Range Status  10/02/2012 1.05 0.60 - 1.30 mg/dL Final   Creatinine, Ser  Date Value Ref Range Status  02/02/2021 0.78 0.57 - 1.00 mg/dL Final          Passed - CO2 in normal range and within 360 days    CO2  Date Value Ref Range Status  02/02/2021 22 20 - 29 mmol/L Final   Co2  Date Value Ref Range Status  10/02/2012 22 21 - 32 mmol/L Final          Passed - Valid encounter within last 12 months    Recent Outpatient Visits           6 days ago Essential hypertension   [place], Deanna C, MD   6 months ago Essential hypertension   [facility] Juline Patch, MD   7 months ago Depression, recurrent [facility])   [facility] Juline Patch, MD   10 months ago [facility] Juline Patch, MD   1 year ago Essential hypertension   [place], Deanna C, MD       Future Appointments             In 5 months Juline Patch, MD [facility], [facility]

## 2021-02-11 DIAGNOSIS — M545 Low back pain, unspecified: Secondary | ICD-10-CM | POA: Diagnosis not present

## 2021-02-11 DIAGNOSIS — G894 Chronic pain syndrome: Secondary | ICD-10-CM | POA: Diagnosis not present

## 2021-02-11 DIAGNOSIS — M1990 Unspecified osteoarthritis, unspecified site: Secondary | ICD-10-CM | POA: Diagnosis not present

## 2021-02-11 DIAGNOSIS — M5030 Other cervical disc degeneration, unspecified cervical region: Secondary | ICD-10-CM | POA: Diagnosis not present

## 2021-03-06 DIAGNOSIS — G894 Chronic pain syndrome: Secondary | ICD-10-CM | POA: Diagnosis not present

## 2021-03-06 DIAGNOSIS — M5030 Other cervical disc degeneration, unspecified cervical region: Secondary | ICD-10-CM | POA: Diagnosis not present

## 2021-03-06 DIAGNOSIS — M545 Low back pain, unspecified: Secondary | ICD-10-CM | POA: Diagnosis not present

## 2021-03-09 ENCOUNTER — Other Ambulatory Visit: Payer: Self-pay | Admitting: Family Medicine

## 2021-03-09 DIAGNOSIS — G43C0 Periodic headache syndromes in child or adult, not intractable: Secondary | ICD-10-CM

## 2021-03-18 DIAGNOSIS — G894 Chronic pain syndrome: Secondary | ICD-10-CM | POA: Diagnosis not present

## 2021-03-18 DIAGNOSIS — M5030 Other cervical disc degeneration, unspecified cervical region: Secondary | ICD-10-CM | POA: Diagnosis not present

## 2021-03-18 DIAGNOSIS — M545 Low back pain, unspecified: Secondary | ICD-10-CM | POA: Diagnosis not present

## 2021-03-18 DIAGNOSIS — M5481 Occipital neuralgia: Secondary | ICD-10-CM | POA: Diagnosis not present

## 2021-03-24 ENCOUNTER — Other Ambulatory Visit: Payer: Self-pay | Admitting: Family Medicine

## 2021-03-24 DIAGNOSIS — G43C0 Periodic headache syndromes in child or adult, not intractable: Secondary | ICD-10-CM

## 2021-03-24 NOTE — Telephone Encounter (Signed)
Requested medication (s) are due for refill today: no  Requested medication (s) are on the active medication list: yes  Last refill:  03/10/21 #60 0 refills  Future visit scheduled: yes in 4 months  Notes to clinic:  not delegated per protocol.   Pharmacy : requesting 90 day prescription.     Requested Prescriptions  Pending Prescriptions Disp Refills   topiramate (TOPAMAX) 50 MG tablet [Pharmacy Med Name: TOPIRAMATE 50 MG TABLET] 180 tablet 1    Sig: TAKE 1 TABLET BY MOUTH TWICE A DAY     Not Delegated - Neurology: Anticonvulsants - topiramate & zonisamide Failed - 03/24/2021 12:16 PM      Failed - This refill cannot be delegated      Passed - Cr in normal range and within 360 days    Creatinine  Date Value Ref Range Status  10/02/2012 1.05 0.60 - 1.30 mg/dL Final   Creatinine, Ser  Date Value Ref Range Status  02/02/2021 0.78 0.57 - 1.00 mg/dL Final          Passed - CO2 in normal range and within 360 days    CO2  Date Value Ref Range Status  02/02/2021 22 20 - 29 mmol/L Final   Co2  Date Value Ref Range Status  10/02/2012 22 21 - 32 mmol/L Final          Passed - Valid encounter within last 12 months    Recent Outpatient Visits           1 month ago Essential hypertension   [place], Deanna C, MD   7 months ago Essential hypertension   [facility] Juline Patch, MD   8 months ago Depression, recurrent [facility])   [facility] Juline Patch, MD   1 year ago [place], Deanna C, MD   1 year ago Essential hypertension   [place], Deanna C, MD       Future Appointments             In 4 months Juline Patch, MD [facility], [facility]

## 2021-04-05 DIAGNOSIS — M545 Low back pain, unspecified: Secondary | ICD-10-CM | POA: Diagnosis not present

## 2021-04-05 DIAGNOSIS — M5030 Other cervical disc degeneration, unspecified cervical region: Secondary | ICD-10-CM | POA: Diagnosis not present

## 2021-04-05 DIAGNOSIS — G894 Chronic pain syndrome: Secondary | ICD-10-CM | POA: Diagnosis not present

## 2021-04-21 DIAGNOSIS — M47816 Spondylosis without myelopathy or radiculopathy, lumbar region: Secondary | ICD-10-CM | POA: Diagnosis not present

## 2021-04-21 DIAGNOSIS — G8929 Other chronic pain: Secondary | ICD-10-CM | POA: Diagnosis not present

## 2021-04-21 DIAGNOSIS — M48062 Spinal stenosis, lumbar region with neurogenic claudication: Secondary | ICD-10-CM | POA: Diagnosis not present

## 2021-04-21 DIAGNOSIS — M545 Low back pain, unspecified: Secondary | ICD-10-CM | POA: Diagnosis not present

## 2021-04-21 DIAGNOSIS — M9931 Osseous stenosis of neural canal of cervical region: Secondary | ICD-10-CM | POA: Diagnosis not present

## 2021-04-21 DIAGNOSIS — M47897 Other spondylosis, lumbosacral region: Secondary | ICD-10-CM | POA: Diagnosis not present

## 2021-04-21 DIAGNOSIS — M9951 Intervertebral disc stenosis of neural canal of cervical region: Secondary | ICD-10-CM | POA: Diagnosis not present

## 2021-04-21 DIAGNOSIS — R6889 Other general symptoms and signs: Secondary | ICD-10-CM | POA: Diagnosis not present

## 2021-04-21 DIAGNOSIS — M5030 Other cervical disc degeneration, unspecified cervical region: Secondary | ICD-10-CM | POA: Diagnosis not present

## 2021-04-21 DIAGNOSIS — M25559 Pain in unspecified hip: Secondary | ICD-10-CM | POA: Diagnosis not present

## 2021-04-21 DIAGNOSIS — M1990 Unspecified osteoarthritis, unspecified site: Secondary | ICD-10-CM | POA: Diagnosis not present

## 2021-04-21 DIAGNOSIS — M79609 Pain in unspecified limb: Secondary | ICD-10-CM | POA: Diagnosis not present

## 2021-04-21 DIAGNOSIS — G894 Chronic pain syndrome: Secondary | ICD-10-CM | POA: Diagnosis not present

## 2021-04-21 DIAGNOSIS — R519 Headache, unspecified: Secondary | ICD-10-CM | POA: Diagnosis not present

## 2021-04-24 ENCOUNTER — Other Ambulatory Visit: Payer: Self-pay | Admitting: Family Medicine

## 2021-04-24 DIAGNOSIS — G43C0 Periodic headache syndromes in child or adult, not intractable: Secondary | ICD-10-CM

## 2021-04-25 NOTE — Telephone Encounter (Signed)
Requested medications are due for refill today yes  Requested medications are on the active medication list yes  Last refill 03/24/21  Last visit 02/02/21  Future visit scheduled 08/06/21  Notes to clinic Not Delegated, please assess.  Requested Prescriptions  Pending Prescriptions Disp Refills   topiramate (TOPAMAX) 50 MG tablet [Pharmacy Med Name: TOPIRAMATE 50 MG TABLET] 60 tablet 0    Sig: TAKE 1 TABLET BY MOUTH TWICE A DAY     Not Delegated - Neurology: Anticonvulsants - topiramate & zonisamide Failed - 04/24/2021  1:32 PM      Failed - This refill cannot be delegated      Passed - Cr in normal range and within 360 days    Creatinine  Date Value Ref Range Status  10/02/2012 1.05 0.60 - 1.30 mg/dL Final   Creatinine, Ser  Date Value Ref Range Status  02/02/2021 0.78 0.57 - 1.00 mg/dL Final          Passed - CO2 in normal range and within 360 days    CO2  Date Value Ref Range Status  02/02/2021 22 20 - 29 mmol/L Final   Co2  Date Value Ref Range Status  10/02/2012 22 21 - 32 mmol/L Final          Passed - Valid encounter within last 12 months    Recent Outpatient Visits           2 months ago Essential hypertension   Jamie Kim, Jamie C, MD   8 months ago Essential hypertension   [facility] Juline Patch, MD   9 months ago Depression, recurrent [facility])   [facility] Juline Patch, MD   1 year ago [place], Jamie C, MD   1 year ago Essential hypertension   [place], Jamie C, MD       Future Appointments             In 3 months Juline Patch, MD [facility], [facility]

## 2021-05-05 DIAGNOSIS — G894 Chronic pain syndrome: Secondary | ICD-10-CM | POA: Diagnosis not present

## 2021-05-05 DIAGNOSIS — M5030 Other cervical disc degeneration, unspecified cervical region: Secondary | ICD-10-CM | POA: Diagnosis not present

## 2021-05-05 DIAGNOSIS — M545 Low back pain, unspecified: Secondary | ICD-10-CM | POA: Diagnosis not present

## 2021-05-06 DIAGNOSIS — H04123 Dry eye syndrome of bilateral lacrimal glands: Secondary | ICD-10-CM | POA: Diagnosis not present

## 2021-05-06 DIAGNOSIS — H524 Presbyopia: Secondary | ICD-10-CM | POA: Diagnosis not present

## 2021-05-06 DIAGNOSIS — H5213 Myopia, bilateral: Secondary | ICD-10-CM | POA: Diagnosis not present

## 2021-05-06 DIAGNOSIS — H52213 Irregular astigmatism, bilateral: Secondary | ICD-10-CM | POA: Diagnosis not present

## 2021-05-06 DIAGNOSIS — H16223 Keratoconjunctivitis sicca, not specified as Sjogren's, bilateral: Secondary | ICD-10-CM | POA: Diagnosis not present

## 2021-05-12 ENCOUNTER — Other Ambulatory Visit: Payer: Self-pay | Admitting: Family Medicine

## 2021-05-12 DIAGNOSIS — N3941 Urge incontinence: Secondary | ICD-10-CM

## 2021-05-12 NOTE — Telephone Encounter (Signed)
Requested Prescriptions  Pending Prescriptions Disp Refills  . oxybutynin (DITROPAN-XL) 5 MG 24 hr tablet [Pharmacy Med Name: OXYBUTYNIN CL ER 5 MG TABLET] 90 tablet 0    Sig: TAKE 1 TABLET BY MOUTH EVERYDAY AT BEDTIME     Urology:  Bladder Agents Passed - 05/12/2021  1:24 AM      Passed - Valid encounter within last 12 months    Recent Outpatient Visits          3 months ago Essential hypertension   [place], MD   9 months ago Essential hypertension   [place], MD   10 months ago Depression, recurrent [facility])   [facility] Juline Patch, MD   1 year ago [place], MD   1 year ago Essential hypertension   [place], MD      Future Appointments            In 2 months Juline Patch, MD [facility], [facility]

## 2021-05-20 DIAGNOSIS — M545 Low back pain, unspecified: Secondary | ICD-10-CM | POA: Diagnosis not present

## 2021-05-20 DIAGNOSIS — G894 Chronic pain syndrome: Secondary | ICD-10-CM | POA: Diagnosis not present

## 2021-05-20 DIAGNOSIS — M5030 Other cervical disc degeneration, unspecified cervical region: Secondary | ICD-10-CM | POA: Diagnosis not present

## 2021-05-20 DIAGNOSIS — R6889 Other general symptoms and signs: Secondary | ICD-10-CM | POA: Diagnosis not present

## 2021-06-28 ENCOUNTER — Other Ambulatory Visit: Payer: Self-pay | Admitting: Family Medicine

## 2021-06-28 DIAGNOSIS — G43C0 Periodic headache syndromes in child or adult, not intractable: Secondary | ICD-10-CM

## 2021-07-01 DIAGNOSIS — G894 Chronic pain syndrome: Secondary | ICD-10-CM | POA: Diagnosis not present

## 2021-07-01 DIAGNOSIS — M545 Low back pain, unspecified: Secondary | ICD-10-CM | POA: Diagnosis not present

## 2021-07-01 DIAGNOSIS — M5030 Other cervical disc degeneration, unspecified cervical region: Secondary | ICD-10-CM | POA: Diagnosis not present

## 2021-07-01 DIAGNOSIS — R6889 Other general symptoms and signs: Secondary | ICD-10-CM | POA: Diagnosis not present

## 2021-07-25 ENCOUNTER — Other Ambulatory Visit: Payer: Self-pay | Admitting: Family Medicine

## 2021-07-25 DIAGNOSIS — G43C0 Periodic headache syndromes in child or adult, not intractable: Secondary | ICD-10-CM

## 2021-07-25 NOTE — Telephone Encounter (Signed)
Requested medication (s) are due for refill today: yes  Requested medication (s) are on the active medication list: yes  Last refill:  06/29/21 #60  Future visit scheduled: yes  Notes to clinic:  med not delegated to NT to RF   Requested Prescriptions  Pending Prescriptions Disp Refills   topiramate (TOPAMAX) 50 MG tablet [Pharmacy Med Name: TOPIRAMATE 50 MG TABLET] 60 tablet 0    Sig: TAKE 1 TABLET BY MOUTH TWICE A DAY     Not Delegated - Neurology: Anticonvulsants - topiramate & zonisamide Failed - 07/25/2021 12:33 PM      Failed - This refill cannot be delegated      Passed - Cr in normal range and within 360 days    Creatinine  Date Value Ref Range Status  10/02/2012 1.05 0.60 - 1.30 mg/dL Final   Creatinine, Ser  Date Value Ref Range Status  02/02/2021 0.78 0.57 - 1.00 mg/dL Final          Passed - CO2 in normal range and within 360 days    CO2  Date Value Ref Range Status  02/02/2021 22 20 - 29 mmol/L Final   Co2  Date Value Ref Range Status  10/02/2012 22 21 - 32 mmol/L Final          Passed - Valid encounter within last 12 months    Recent Outpatient Visits           5 months ago Essential hypertension   [place], MD   11 months ago Essential hypertension   [facility] Juline Patch, MD   1 year ago Depression, recurrent [facility])   [facility] Juline Patch, MD   1 year ago [place], Deanna C, MD   1 year ago Essential hypertension   [place], Deanna C, MD       Future Appointments             In 1 week Juline Patch, MD [facility], [facility]

## 2021-08-06 ENCOUNTER — Ambulatory Visit: Payer: Medicare Other | Admitting: Family Medicine

## 2021-08-10 ENCOUNTER — Other Ambulatory Visit: Payer: Self-pay | Admitting: Family Medicine

## 2021-08-10 DIAGNOSIS — G43C0 Periodic headache syndromes in child or adult, not intractable: Secondary | ICD-10-CM

## 2021-08-11 DIAGNOSIS — M5030 Other cervical disc degeneration, unspecified cervical region: Secondary | ICD-10-CM | POA: Diagnosis not present

## 2021-08-11 DIAGNOSIS — G894 Chronic pain syndrome: Secondary | ICD-10-CM | POA: Diagnosis not present

## 2021-08-11 DIAGNOSIS — M9951 Intervertebral disc stenosis of neural canal of cervical region: Secondary | ICD-10-CM | POA: Diagnosis not present

## 2021-08-11 DIAGNOSIS — M545 Low back pain, unspecified: Secondary | ICD-10-CM | POA: Diagnosis not present

## 2021-08-11 DIAGNOSIS — M792 Neuralgia and neuritis, unspecified: Secondary | ICD-10-CM | POA: Diagnosis not present

## 2021-08-11 DIAGNOSIS — M25559 Pain in unspecified hip: Secondary | ICD-10-CM | POA: Diagnosis not present

## 2021-08-11 DIAGNOSIS — G8929 Other chronic pain: Secondary | ICD-10-CM | POA: Diagnosis not present

## 2021-08-11 DIAGNOSIS — M79609 Pain in unspecified limb: Secondary | ICD-10-CM | POA: Diagnosis not present

## 2021-08-11 DIAGNOSIS — R519 Headache, unspecified: Secondary | ICD-10-CM | POA: Diagnosis not present

## 2021-08-11 DIAGNOSIS — M25519 Pain in unspecified shoulder: Secondary | ICD-10-CM | POA: Diagnosis not present

## 2021-08-11 DIAGNOSIS — M47897 Other spondylosis, lumbosacral region: Secondary | ICD-10-CM | POA: Diagnosis not present

## 2021-08-11 DIAGNOSIS — M48062 Spinal stenosis, lumbar region with neurogenic claudication: Secondary | ICD-10-CM | POA: Diagnosis not present

## 2021-08-20 ENCOUNTER — Other Ambulatory Visit: Payer: Self-pay | Admitting: Family Medicine

## 2021-08-20 DIAGNOSIS — E782 Mixed hyperlipidemia: Secondary | ICD-10-CM

## 2021-08-20 NOTE — Telephone Encounter (Signed)
Requested Prescriptions  Pending Prescriptions Disp Refills   simvastatin (ZOCOR) 80 MG tablet [Pharmacy Med Name: SIMVASTATIN 80 MG TABLET] 90 tablet 1    Sig: TAKE 1 TABLET BY MOUTH EVERY DAY     Cardiovascular:  Antilipid - Statins Failed - 08/20/2021  1:41 AM      Failed - Lipid Panel in normal range within the last 12 months    Cholesterol, Total  Date Value Ref Range Status  02/02/2021 163 100 - 199 mg/dL Final   LDL Chol Calc (NIH)  Date Value Ref Range Status  02/02/2021 68 0 - 99 mg/dL Final   HDL  Date Value Ref Range Status  02/02/2021 58 >39 mg/dL Final   Triglycerides  Date Value Ref Range Status  02/02/2021 226 (H) 0 - 149 mg/dL Final         Passed - Patient is not pregnant      Passed - Valid encounter within last 12 months    Recent Outpatient Visits          6 months ago Essential hypertension   [place], Deanna C, MD   1 year ago Essential hypertension   [place], Deanna C, MD   1 year ago Depression, recurrent [facility])   [facility] Juline Patch, MD   1 year ago [place], Deanna C, MD   1 year ago Essential hypertension   [place], Deanna C, MD      Future Appointments            In 5 months Juline Patch, MD [facility], [facility]

## 2021-08-31 ENCOUNTER — Other Ambulatory Visit: Payer: Self-pay | Admitting: Family Medicine

## 2021-08-31 DIAGNOSIS — N3941 Urge incontinence: Secondary | ICD-10-CM

## 2021-08-31 DIAGNOSIS — F419 Anxiety disorder, unspecified: Secondary | ICD-10-CM

## 2021-08-31 DIAGNOSIS — K219 Gastro-esophageal reflux disease without esophagitis: Secondary | ICD-10-CM

## 2021-08-31 DIAGNOSIS — I10 Essential (primary) hypertension: Secondary | ICD-10-CM

## 2021-08-31 DIAGNOSIS — E782 Mixed hyperlipidemia: Secondary | ICD-10-CM

## 2021-09-01 NOTE — Telephone Encounter (Signed)
Requested medication (s) are due for refill today - yes  Requested medication (s) are on the active medication list -yes  Future visit scheduled -yes  Last refill: Esomeprazole 02/02/21 #90 1 RF- fails lab protocol                  Ezetimibe- 11/04/20#90 2RF- fails lab protocol                  Metoprolol- 6 months ago - fails visit protocol  Notes to clinic: Request RF: Call to patient listed number- female answered- states wrong number- unable to schedule medication follow up, other medications fail lab protocol  Requested Prescriptions  Pending Prescriptions Disp Refills   esomeprazole ([place]) 40 MG capsule [Pharmacy Med Name: ESOMEPRAZOLE MAG DR 40 MG CAP] 90 capsule 2    Sig: TAKE ONE CAPSULE BY MOUTH EVERY DAY AS DIRECTED BY PROVIDER     Gastroenterology: Proton Pump Inhibitors 2 Failed - 08/31/2021  1:41 AM      Failed - ALT in normal range and within 360 days    ALT  Date Value Ref Range Status  11/02/2018 13 0 - 32 IU/L Final   SGPT (ALT)  Date Value Ref Range Status  10/02/2012 22 12 - 78 U/L Final          Failed - AST in normal range and within 360 days    AST  Date Value Ref Range Status  11/02/2018 17 0 - 40 IU/L Final   SGOT(AST)  Date Value Ref Range Status  10/02/2012 18 15 - 37 Unit/L Final          Passed - Valid encounter within last 12 months    Recent Outpatient Visits           7 months ago Essential hypertension   [facility] Juline Patch, MD   1 year ago Essential hypertension   [facility], MD   1 year ago Depression, recurrent [facility])   [facility] Juline Patch, MD   1 year ago [place], Deanna C, MD   1 year ago Essential hypertension   [place], Deanna C, MD       Future Appointments             In 5 months Juline Patch, MD [facility]             ezetimibe (ZETIA) 10 MG tablet [Pharmacy Med Name: EZETIMIBE  10 MG TABLET] 90 tablet 1    Sig: TAKE 1 TABLET (10 MG TOTAL) BY MOUTH DAILY. REPORTED ON 10/15/2015     Cardiovascular:  Antilipid - Sterol Transport Inhibitors Failed - 08/31/2021  1:41 AM      Failed - AST in normal range and within 360 days    AST  Date Value Ref Range Status  11/02/2018 17 0 - 40 IU/L Final   SGOT(AST)  Date Value Ref Range Status  10/02/2012 18 15 - 37 Unit/L Final          Failed - ALT in normal range and within 360 days    ALT  Date Value Ref Range Status  11/02/2018 13 0 - 32 IU/L Final   SGPT (ALT)  Date Value Ref Range Status  10/02/2012 22 12 - 78 U/L Final          Failed - Lipid Panel in normal range within the  last 12 months    Cholesterol, Total  Date Value Ref Range Status  02/02/2021 163 100 - 199 mg/dL Final   LDL Chol Calc (NIH)  Date Value Ref Range Status  02/02/2021 68 0 - 99 mg/dL Final   HDL  Date Value Ref Range Status  02/02/2021 58 >39 mg/dL Final   Triglycerides  Date Value Ref Range Status  02/02/2021 226 (H) 0 - 149 mg/dL Final         Passed - Patient is not pregnant      Passed - Valid encounter within last 12 months    Recent Outpatient Visits           7 months ago Essential hypertension   [facility] Jul[facility], MD   1 year ago Essential hypertension   [facility] Juline Patch, MD   1 year ago Depression, recurrent [facility])   [facility] Juline Patch, MD   1 year ago [facility], MD   1 year ago Essential hypertension   [facility] Juline [facility], MD       Future Appointments             In 5 months Juline Patch, MD [facility]             metoprolol succinate (TOPROL-XL) 100 MG 24 hr tablet [Pharmacy Med Name: METOPROLOL SUCC ER 100 MG TAB] 90 tablet 1    Sig: TAKE 1 TABLET BY MOUTH DAILY. TAKE WITH OR IMMEDIATELY FOLLOWING A MEAL.     Cardiovascular:  Beta Blockers Failed -  08/31/2021  1:41 AM      Failed - Valid encounter within last 6 months    Recent Outpatient Visits           7 months ago Essential hypertension   [facility] Juline Patch, MD   1 year ago Essential hypertension   [facility], [facility]   1 year ago Depression, recurrent [facility])   [facility], MD   1 year ago [facility], [facility]   1 year ago Essential hypertension   [facility] Juline Patch, MD       Future Appointments             In 5 months Juline Patch, MD [facility], [place] BP in normal range    BP Readings from Last 1 Encounters:  02/02/21 138/80          Passed - Last Heart Rate in normal range    Pulse Readings from Last 1 Encounters:  02/02/21 61          Signed Prescriptions Disp Refills   busPIRone (BUSPAR) 30 MG tablet 180 tablet 1    Sig: TAKE 1 TABLET BY MOUTH 2 TIMES DAILY.     Psychiatry: Anxiolytics/Hypnotics - Non-controlled Passed - 08/31/2021  1:41 AM      Passed - Valid encounter within last 12 months    Recent Outpatient Visits           7 months ago Essential hypertension   [facility] Juline Patch, MD   1 year ago Essential hypertension   [facility] Jul[facility], MD   1 year  ago Depression, recurrent ([place])   [facility] Juline Patch, MD   1 year ago [facility], MD   1 year ago Essential hypertension   [facility] Juline [facility], MD       Future Appointments             In 5 months Juline Patch, MD [facility]             oxybutynin (DITROPAN-XL) 5 MG 24 hr tablet 90 tablet 0    Sig: TAKE 1 TABLET BY MOUTH EVERYDAY AT BEDTIME     Urology:  Bladder Agents Passed - 08/31/2021  1:41 AM      Passed - Valid encounter within last 12 months    Recent Outpatient Visits            7 months ago Essential hypertension   [place], Deanna C, MD   1 year ago Essential hypertension   [facility] Juline Patch, MD   1 year ago Depression, recurrent [facility])   [facility] Juline Patch, MD   1 year ago [facility], [facility]   1 year ago Essential hypertension   [facility]       Future Appointments             In 5 months Juline Patch, MD [facility], [facility]               Requested Prescriptions  Pending Prescriptions Disp Refills   esomeprazole ([place]) 40 MG capsule [Pharmacy Med Name: ESOMEPRAZOLE MAG DR 40 MG CAP] 90 capsule 2    Sig: TAKE ONE CAPSULE BY MOUTH EVERY DAY AS DIRECTED BY PROVIDER     Gastroenterology: Proton Pump Inhibitors 2 Failed - 08/31/2021  1:41 AM      Failed - ALT in normal range and within 360 days    ALT  Date Value Ref Range Status  11/02/2018 13 0 - 32 IU/L Final   SGPT (ALT)  Date Value Ref Range Status  10/02/2012 22 12 - 78 U/L Final          Failed - AST in normal range and within 360 days    AST  Date Value Ref Range Status  11/02/2018 17 0 - 40 IU/L Final   SGOT(AST)  Date Value Ref Range Status  10/02/2012 18 15 - 37 Unit/L Final          Passed - Valid encounter within last 12 months    Recent Outpatient Visits           7 months ago Essential hypertension   [facility], MD   1 year ago Essential hypertension   [facility] Juline [facility], MD   1 year ago Depression, recurrent [facility])   [facility] Juline [facility], MD   1 year ago [place], Deanna C, MD   1 year ago Essential hypertension   [place], MD       Future Appointments             In 5 months Juline Patch, MD [facility]             ezetimibe (ZETIA) 10 MG tablet [Pharmacy Med Name:  EZETIMIBE 10 MG TABLET] 90 tablet 1    Sig: TAKE 1 TABLET (10 MG TOTAL) BY MOUTH DAILY. REPORTED ON 10/15/2015     Cardiovascular:  Antilipid - Sterol Transport Inhibitors Failed - 08/31/2021  1:41 AM      Failed - AST in normal range and within 360 days    AST  Date Value Ref Range Status  11/02/2018 17 0 - 40 IU/L Final   SGOT(AST)  Date Value Ref Range Status  10/02/2012 18 15 - 37 Unit/L Final          Failed - ALT in normal range and within 360 days    ALT  Date Value Ref Range Status  11/02/2018 13 0 - 32 IU/L Final   SGPT (ALT)  Date Value Ref Range Status  10/02/2012 22 12 - 78 U/L Final          Failed - Lipid Panel in normal range within the last 12 months    Cholesterol, Total  Date Value Ref Range Status  02/02/2021 163 100 - 199 mg/dL Final   LDL Chol Calc (NIH)  Date Value Ref Range Status  02/02/2021 68 0 - 99 mg/dL Final   HDL  Date Value Ref Range Status  02/02/2021 58 >39 mg/dL Final   Triglycerides  Date Value Ref Range Status  02/02/2021 226 (H) 0 - 149 mg/dL Final         Passed - Patient is not pregnant      Passed - Valid encounter within last 12 months    Recent Outpatient Visits           7 months ago Essential hypertension   [facility] Jul[facility], MD   1 year ago Essential hypertension   [facility] Juline Patch, MD   1 year ago Depression, recurrent [facility])   [facility] Juline Patch, MD   1 year ago [facility], MD   1 year ago Essential hypertension   [facility], [facility]       Future Appointments             In 5 months Juline Patch, MD [facility]             metoprolol succinate (TOPROL-XL) 100 MG 24 hr tablet [Pharmacy Med Name: METOPROLOL SUCC ER 100 MG TAB] 90 tablet 1    Sig: TAKE 1 TABLET BY MOUTH DAILY. TAKE WITH OR IMMEDIATELY FOLLOWING A MEAL.     Cardiovascular:  Beta Blockers Failed  - 08/31/2021  1:41 AM      Failed - Valid encounter within last 6 months    Recent Outpatient Visits           7 months ago Essential hypertension   [place], MD   1 year ago Essential hypertension   [facility], MD   1 year ago Depression, recurrent [facility])   [facility] Jul[facility], MD   1 year ago [place], Deanna C, MD   1 year ago Essential hypertension   [place], Deanna C, MD       Future Appointments             In 5 months Juline Patch, MD [facility], [facility]  Passed - Last BP in normal range    BP Readings from Last 1 Encounters:  02/02/21 138/80          Passed - Last Heart Rate in normal range    Pulse Readings from Last 1 Encounters:  02/02/21 61          Signed Prescriptions Disp Refills   busPIRone (BUSPAR) 30 MG tablet 180 tablet 1    Sig: TAKE 1 TABLET BY MOUTH 2 TIMES DAILY.     Psychiatry: Anxiolytics/Hypnotics - Non-controlled Passed - 08/31/2021  1:41 AM      Passed - Valid encounter within last 12 months    Recent Outpatient Visits           7 months ago Essential hypertension   [facility] Juline Patch, MD   1 year ago Essential hypertension   [facility] Juline Patch, MD   1 year ago Depression, recurrent [facility])   [facility] Juline [facility], MD   1 year ago [facility] Juline Patch, MD   1 year ago Essential hypertension   [facility] Jul[facility], MD       Future Appointments             In 5 months Juline Patch, MD [facility]             oxybutynin (DITROPAN-XL) 5 MG 24 hr tablet 90 tablet 0    Sig: TAKE 1 TABLET BY MOUTH EVERYDAY AT BEDTIME     Urology:  Bladder Agents Passed - 08/31/2021  1:41 AM      Passed - Valid encounter within last 12 months    Recent Outpatient Visits            7 months ago Essential hypertension   [place], Deanna C, MD   1 year ago Essential hypertension   [facility], [facility]   1 year ago Depression, recurrent [facility])   [facility] Juline Patch, MD   1 year ago [place], Deanna C, MD   1 year ago Essential hypertension   [facility], Deanna C, MD       Future Appointments             In 5 months Juline Patch, MD [facility], [facility]

## 2021-09-01 NOTE — Telephone Encounter (Signed)
Requested Prescriptions  Pending Prescriptions Disp Refills   esomeprazole (NEX[place]) 40 MG capsule [Pharmacy Med Name: ESOMEPRAZOLE MAG DR 40 MG CAP] 90 capsule 2    Sig: TAKE ONE CAPSULE BY MOUTH EVERY DAY AS DIRECTED BY PROVIDER     Gastroenterology: Proton Pump Inhibitors 2 Failed - 08/31/2021  1:41 AM      Failed - ALT in normal range and within 360 days    ALT  Date Value Ref Range Status  11/02/2018 13 0 - 32 IU/L Final   SGPT (ALT)  Date Value Ref Range Status  10/02/2012 22 12 - 78 U/L Final         Failed - AST in normal range and within 360 days    AST  Date Value Ref Range Status  11/02/2018 17 0 - 40 IU/L Final   SGOT(AST)  Date Value Ref Range Status  10/02/2012 18 15 - 37 Unit/L Final         Passed - Valid encounter within last 12 months    Recent Outpatient Visits          7 months ago Essential hypertension   [place], MD   1 year ago Essential hypertension   [facility] Juline Patch, MD   1 year ago Depression, recurrent [facility])   [facility] Juline Patch, MD   1 year ago [place], Deanna C, MD   1 year ago Essential hypertension   [place], MD      Future Appointments            In 5 months Juline Patch, MD [facility]            ezetimibe (ZETIA) 10 MG tablet [Pharmacy Med Name: EZETIMIBE 10 MG TABLET] 90 tablet 1    Sig: TAKE 1 TABLET (10 MG TOTAL) BY MOUTH DAILY. REPORTED ON 10/15/2015     Cardiovascular:  Antilipid - Sterol Transport Inhibitors Failed - 08/31/2021  1:41 AM      Failed - AST in normal range and within 360 days    AST  Date Value Ref Range Status  11/02/2018 17 0 - 40 IU/L Final   SGOT(AST)  Date Value Ref Range Status  10/02/2012 18 15 - 37 Unit/L Final         Failed - ALT in normal range and within 360 days    ALT  Date Value Ref Range Status  11/02/2018 13 0 - 32 IU/L Final    SGPT (ALT)  Date Value Ref Range Status  10/02/2012 22 12 - 78 U/L Final         Failed - Lipid Panel in normal range within the last 12 months    Cholesterol, Total  Date Value Ref Range Status  02/02/2021 163 100 - 199 mg/dL Final   LDL Chol Calc (NIH)  Date Value Ref Range Status  02/02/2021 68 0 - 99 mg/dL Final   HDL  Date Value Ref Range Status  02/02/2021 58 >39 mg/dL Final   Triglycerides  Date Value Ref Range Status  02/02/2021 226 (H) 0 - 149 mg/dL Final         Passed - Patient is not pregnant      Passed - Valid encounter within last 12 months    Recent Outpatient Visits          7 months ago Essential  hypertension   [facility] Juline Patch, MD   1 year ago Essential hypertension   [facility] Juline Patch, MD   1 year ago Depression, recurrent [facility])   [facility] Juline Patch, MD   1 year ago [place], MD   1 year ago Essential hypertension   [facility] Juline Patch, MD      Future Appointments            In 5 months Juline Patch, MD [facility]            busPIRone (BUSPAR) 30 MG tablet [Pharmacy Med Name: BUSPIRONE HCL 30 MG TABLET] 180 tablet 1    Sig: TAKE 1 TABLET BY MOUTH 2 TIMES DAILY.     Psychiatry: Anxiolytics/Hypnotics - Non-controlled Passed - 08/31/2021  1:41 AM      Passed - Valid encounter within last 12 months    Recent Outpatient Visits          7 months ago Essential hypertension   [facility] Juline Patch, MD   1 year ago Essential hypertension   [facility] Juline Patch, MD   1 year ago Depression, recurrent [facility])   [facility] Juline Patch, MD   1 year ago [place], MD   1 year ago Essential hypertension   [facility] Juline [facility], MD      Future Appointments            In 5 months Juline Patch, MD  [facility]            metoprolol succinate (TOPROL-XL) 100 MG 24 hr tablet [Pharmacy Med Name: METOPROLOL SUCC ER 100 MG TAB] 90 tablet 1    Sig: TAKE 1 TABLET BY MOUTH DAILY. TAKE WITH OR IMMEDIATELY FOLLOWING A MEAL.     Cardiovascular:  Beta Blockers Failed - 08/31/2021  1:41 AM      Failed - Valid encounter within last 6 months    Recent Outpatient Visits          7 months ago Essential hypertension   [place], Deanna C, MD   1 year ago Essential hypertension   [facility] Juline Patch, MD   1 year ago Depression, recurrent [facility])   [facility] Jul[facility], MD   1 year ago [facility] Juline [facility], MD   1 year ago Essential hypertension   [facility] Jul[facility], MD      Future Appointments            In 5 months Juline Patch, MD [facility], [place] BP in normal range    BP Readings from Last 1 Encounters:  02/02/21 138/80         Passed - Last Heart Rate in normal range    Pulse Readings from Last 1 Encounters:  02/02/21 61          oxybutynin (DITROPAN-XL) 5 MG 24 hr tablet [Pharmacy Med Name: OXYBUTYNIN CL ER 5 MG TABLET] 90 tablet 0    Sig: TAKE 1 TABLET BY MOUTH EVERYDAY AT BEDTIME     Urology:  Bladder Agents Passed - 08/31/2021  1:41 AM  Passed - Valid encounter within last 12 months    Recent Outpatient Visits          7 months ago Essential hypertension   [place], Deanna C, MD   1 year ago Essential hypertension   [facility] Juline Patch, MD   1 year ago Depression, recurrent [facility])   [facility] Juline Patch, MD   1 year ago [place], Deanna C, MD   1 year ago Essential hypertension   [place], MD      Future Appointments            In 5 months Juline Patch, MD [facility], [facility]

## 2021-09-09 ENCOUNTER — Other Ambulatory Visit: Payer: Self-pay | Admitting: Family Medicine

## 2021-09-09 DIAGNOSIS — G43C0 Periodic headache syndromes in child or adult, not intractable: Secondary | ICD-10-CM

## 2021-09-10 NOTE — Telephone Encounter (Signed)
Requested Prescriptions  ?Pending Prescriptions Disp Refills  ?? topiramate (TOPAMAX) 50 MG tablet [Pharmacy Med Name: TOPIRAMATE 50 MG TABLET] 60 tablet 0  ?  Sig: TAKE 1 TABLET BY MOUTH TWICE A DAY  ?  ? Neurology: Anticonvulsants - topiramate & zonisamide Failed - 09/09/2021 12:34 PM  ?  ?  Failed - ALT in normal range and within 360 days  ?  ALT  ?Date Value Ref Range Status  ?11/02/2018 13 0 - 32 IU/L Final  ? ?SGPT (ALT)  ?Date Value Ref Range Status  ?10/02/2012 22 12 - 78 U/L Final  ?   ?  ?  Failed - AST in normal range and within 360 days  ?  AST  ?Date Value Ref Range Status  ?11/02/2018 17 0 - 40 IU/L Final  ? ?SGOT(AST)  ?Date Value Ref Range Status  ?10/02/2012 18 15 - 37 Unit/L Final  ?   ?  ?  Passed - Cr in normal range and within 360 days  ?  Creatinine  ?Date Value Ref Range Status  ?10/02/2012 1.05 0.60 - 1.30 mg/dL Final  ? ?Creatinine, Ser  ?Date Value Ref Range Status  ?02/02/2021 0.78 0.57 - 1.00 mg/dL Final  ?   ?  ?  Passed - CO2 in normal range and within 360 days  ?  CO2  ?Date Value Ref Range Status  ?02/02/2021 22 20 - 29 mmol/L Final  ? ?Co2  ?Date Value Ref Range Status  ?10/02/2012 22 21 - 32 mmol/L Final  ?   ?  ?  Passed - Completed PHQ-2 or PHQ-9 in the last 360 days  ?  ?  Passed - Valid encounter within last 12 months  ?  Recent Outpatient Visits   ?      ? 7 months ago Essential hypertension  ? [facility] Juline Patch, MD  ? 1 year ago Essential hypertension  ? [facility] Juline [facility], MD  ? 1 year ago Depression, recurrent [facility])  ? [facility] Juline Patch, MD  ? 1 year ago Folliculitis  ? [facility] Juline Patch, MD  ? 1 year ago Essential hypertension  ? [facility] Juline Patch, MD  ?  ?  ?Future Appointments   ?        ? In 4 months Juline Patch, MD [facility], [place]  ?  ? ?  ?  ?  ? ?

## 2021-09-15 DIAGNOSIS — M5030 Other cervical disc degeneration, unspecified cervical region: Secondary | ICD-10-CM | POA: Diagnosis not present

## 2021-09-15 DIAGNOSIS — G894 Chronic pain syndrome: Secondary | ICD-10-CM | POA: Diagnosis not present

## 2021-09-15 DIAGNOSIS — M545 Low back pain, unspecified: Secondary | ICD-10-CM | POA: Diagnosis not present

## 2021-09-27 ENCOUNTER — Other Ambulatory Visit: Payer: Self-pay | Admitting: Family Medicine

## 2021-09-27 DIAGNOSIS — I10 Essential (primary) hypertension: Secondary | ICD-10-CM

## 2021-09-28 ENCOUNTER — Other Ambulatory Visit: Payer: Self-pay | Admitting: Family Medicine

## 2021-09-28 DIAGNOSIS — I10 Essential (primary) hypertension: Secondary | ICD-10-CM

## 2021-09-28 DIAGNOSIS — E782 Mixed hyperlipidemia: Secondary | ICD-10-CM

## 2021-09-28 DIAGNOSIS — K219 Gastro-esophageal reflux disease without esophagitis: Secondary | ICD-10-CM

## 2021-10-13 DIAGNOSIS — M5030 Other cervical disc degeneration, unspecified cervical region: Secondary | ICD-10-CM | POA: Diagnosis not present

## 2021-10-13 DIAGNOSIS — G8929 Other chronic pain: Secondary | ICD-10-CM | POA: Diagnosis not present

## 2021-10-13 DIAGNOSIS — G894 Chronic pain syndrome: Secondary | ICD-10-CM | POA: Diagnosis not present

## 2021-10-13 DIAGNOSIS — M545 Low back pain, unspecified: Secondary | ICD-10-CM | POA: Diagnosis not present

## 2021-10-15 ENCOUNTER — Other Ambulatory Visit: Payer: Self-pay | Admitting: Family Medicine

## 2021-10-15 DIAGNOSIS — G43C0 Periodic headache syndromes in child or adult, not intractable: Secondary | ICD-10-CM

## 2021-10-26 ENCOUNTER — Other Ambulatory Visit: Payer: Self-pay | Admitting: Family Medicine

## 2021-10-26 DIAGNOSIS — K219 Gastro-esophageal reflux disease without esophagitis: Secondary | ICD-10-CM

## 2021-10-26 DIAGNOSIS — I10 Essential (primary) hypertension: Secondary | ICD-10-CM

## 2021-10-26 DIAGNOSIS — E782 Mixed hyperlipidemia: Secondary | ICD-10-CM

## 2021-11-06 ENCOUNTER — Other Ambulatory Visit: Payer: Self-pay | Admitting: Family Medicine

## 2021-11-06 DIAGNOSIS — E782 Mixed hyperlipidemia: Secondary | ICD-10-CM

## 2021-11-06 DIAGNOSIS — I10 Essential (primary) hypertension: Secondary | ICD-10-CM

## 2021-11-06 DIAGNOSIS — K219 Gastro-esophageal reflux disease without esophagitis: Secondary | ICD-10-CM

## 2021-11-17 ENCOUNTER — Ambulatory Visit: Payer: Medicare Other | Admitting: Family Medicine

## 2021-11-21 ENCOUNTER — Other Ambulatory Visit: Payer: Self-pay | Admitting: Family Medicine

## 2021-11-21 DIAGNOSIS — I10 Essential (primary) hypertension: Secondary | ICD-10-CM

## 2021-11-24 DIAGNOSIS — M47897 Other spondylosis, lumbosacral region: Secondary | ICD-10-CM | POA: Diagnosis not present

## 2021-11-24 DIAGNOSIS — M25559 Pain in unspecified hip: Secondary | ICD-10-CM | POA: Diagnosis not present

## 2021-11-24 DIAGNOSIS — M5030 Other cervical disc degeneration, unspecified cervical region: Secondary | ICD-10-CM | POA: Diagnosis not present

## 2021-11-24 DIAGNOSIS — M792 Neuralgia and neuritis, unspecified: Secondary | ICD-10-CM | POA: Diagnosis not present

## 2021-11-24 DIAGNOSIS — M48062 Spinal stenosis, lumbar region with neurogenic claudication: Secondary | ICD-10-CM | POA: Diagnosis not present

## 2021-11-24 DIAGNOSIS — M79609 Pain in unspecified limb: Secondary | ICD-10-CM | POA: Diagnosis not present

## 2021-11-24 DIAGNOSIS — M9943 Connective tissue stenosis of neural canal of lumbar region: Secondary | ICD-10-CM | POA: Diagnosis not present

## 2021-11-24 DIAGNOSIS — M545 Low back pain, unspecified: Secondary | ICD-10-CM | POA: Diagnosis not present

## 2021-11-24 DIAGNOSIS — G894 Chronic pain syndrome: Secondary | ICD-10-CM | POA: Diagnosis not present

## 2021-11-24 DIAGNOSIS — M25519 Pain in unspecified shoulder: Secondary | ICD-10-CM | POA: Diagnosis not present

## 2021-11-24 DIAGNOSIS — G8929 Other chronic pain: Secondary | ICD-10-CM | POA: Diagnosis not present

## 2021-12-01 ENCOUNTER — Other Ambulatory Visit: Payer: Self-pay | Admitting: Family Medicine

## 2021-12-01 DIAGNOSIS — N3941 Urge incontinence: Secondary | ICD-10-CM

## 2021-12-01 DIAGNOSIS — I10 Essential (primary) hypertension: Secondary | ICD-10-CM

## 2021-12-02 ENCOUNTER — Ambulatory Visit (INDEPENDENT_AMBULATORY_CARE_PROVIDER_SITE_OTHER): Payer: Medicare Other | Admitting: Family Medicine

## 2021-12-02 ENCOUNTER — Encounter: Payer: Self-pay | Admitting: Family Medicine

## 2021-12-02 VITALS — BP 130/88 | HR 80 | Ht 70.0 in | Wt 185.0 lb

## 2021-12-02 DIAGNOSIS — F339 Major depressive disorder, recurrent, unspecified: Secondary | ICD-10-CM | POA: Diagnosis not present

## 2021-12-02 DIAGNOSIS — N3941 Urge incontinence: Secondary | ICD-10-CM

## 2021-12-02 DIAGNOSIS — E782 Mixed hyperlipidemia: Secondary | ICD-10-CM

## 2021-12-02 DIAGNOSIS — K219 Gastro-esophageal reflux disease without esophagitis: Secondary | ICD-10-CM | POA: Diagnosis not present

## 2021-12-02 DIAGNOSIS — I10 Essential (primary) hypertension: Secondary | ICD-10-CM | POA: Diagnosis not present

## 2021-12-02 DIAGNOSIS — J0100 Acute maxillary sinusitis, unspecified: Secondary | ICD-10-CM | POA: Diagnosis not present

## 2021-12-02 DIAGNOSIS — F419 Anxiety disorder, unspecified: Secondary | ICD-10-CM

## 2021-12-02 DIAGNOSIS — Z1211 Encounter for screening for malignant neoplasm of colon: Secondary | ICD-10-CM

## 2021-12-02 MED ORDER — BUSPIRONE HCL 30 MG PO TABS: 30.0000 mg | ORAL_TABLET | Freq: Two times a day (BID) | ORAL | 1 refills | Status: DC

## 2021-12-02 MED ORDER — BUPROPION HCL ER (XL) 150 MG PO TB24: ORAL_TABLET | ORAL | 1 refills | Status: DC

## 2021-12-02 MED ORDER — ESOMEPRAZOLE MAGNESIUM 40 MG PO CPDR: 40.0000 mg | DELAYED_RELEASE_CAPSULE | Freq: Two times a day (BID) | ORAL | 1 refills | Status: DC

## 2021-12-02 MED ORDER — OXYBUTYNIN CHLORIDE ER 5 MG PO TB24: ORAL_TABLET | ORAL | 1 refills | Status: DC

## 2021-12-02 MED ORDER — SIMVASTATIN 80 MG PO TABS: 80.0000 mg | ORAL_TABLET | Freq: Every day | ORAL | 1 refills | Status: DC

## 2021-12-02 MED ORDER — LOSARTAN POTASSIUM 25 MG PO TABS: 25.0000 mg | ORAL_TABLET | Freq: Every day | ORAL | 1 refills | Status: DC

## 2021-12-02 MED ORDER — METOPROLOL SUCCINATE ER 100 MG PO TB24: ORAL_TABLET | ORAL | 1 refills | Status: DC

## 2021-12-02 MED ORDER — AZITHROMYCIN 250 MG PO TABS: ORAL_TABLET | ORAL | 0 refills | Status: AC

## 2021-12-02 MED ORDER — ESOMEPRAZOLE MAGNESIUM 40 MG PO CPDR: DELAYED_RELEASE_CAPSULE | ORAL | 1 refills | Status: DC

## 2021-12-02 MED ORDER — EZETIMIBE 10 MG PO TABS: 10.0000 mg | ORAL_TABLET | Freq: Every day | ORAL | 1 refills | Status: DC

## 2021-12-02 NOTE — Progress Notes (Signed)
Date:  12/02/2021   Name:  Jamie Kim   DOB:  May 08, 1961   MRN:  [id number]   Chief Complaint: Hypertension, Hyperlipidemia, Gastroesophageal Reflux, Anxiety, Depression, overactive bladder, and Headache  Hypertension This is a chronic problem. The current episode started more than 1 year ago. The problem has been gradually improving since onset. The problem is controlled. Associated symptoms include anxiety and headaches. Pertinent negatives include no chest pain, palpitations or shortness of breath. Past treatments include beta blockers and angiotensin blockers. The current treatment provides moderate improvement. There is no history of angina, kidney disease, CAD/MI, CVA, heart failure, left ventricular hypertrophy, PVD or retinopathy. There is no history of chronic renal disease, a hypertension causing med or renovascular disease.  Hyperlipidemia This is a chronic problem. The current episode started more than 1 year ago. The problem is controlled. Recent lipid tests were reviewed and are normal. She has no history of chronic renal disease or hypothyroidism. Pertinent negatives include no chest pain or shortness of breath. Current antihyperlipidemic treatment includes ezetimibe and statins. The current treatment provides moderate improvement of lipids. There are no compliance problems.   Gastroesophageal Reflux She complains of coughing. She reports no chest pain, no dysphagia, no heartburn or no sore throat. This is a chronic problem. The current episode started more than 1 year ago. The symptoms are aggravated by certain foods. Pertinent negatives include no melena. She has tried a PPI for the symptoms. The treatment provided moderate relief.  Anxiety Presents for follow-up visit. Symptoms include excessive worry and nervous/anxious behavior. Patient reports no chest pain, decreased concentration, depressed mood, irritability, palpitations, restlessness, shortness of breath or  suicidal ideas.    Depression        This is a chronic problem.  Associated symptoms include headaches.  Associated symptoms include no decreased concentration, no hopelessness, no restlessness, not sad and no suicidal ideas.  Past treatments include other medications (welbutrin).  Past medical history includes anxiety.     Pertinent negatives include no hypothyroidism. Headache  This is a chronic problem. The current episode started more than 1 year ago. The problem occurs intermittently. The pain is located in the Right unilateral region. The pain quality is similar to prior headaches. The quality of the pain is described as throbbing. Associated symptoms include coughing and sinus pressure. Pertinent negatives include no sore throat. Treatments tried: topiamate. Her past medical history is significant for hypertension and migraine headaches.  Sinusitis This is a new problem. The current episode started in the past 7 days. The problem has been waxing and waning since onset. There has been no fever. Associated symptoms include congestion, coughing, diaphoresis, headaches and sinus pressure. Pertinent negatives include no shortness of breath or sore throat. The treatment provided moderate relief.   Lab Results  Component Value Date   NA 139 02/02/2021   K 4.6 02/02/2021   CO2 22 02/02/2021   GLUCOSE 95 02/02/2021   BUN 12 02/02/2021   CREATININE 0.78 02/02/2021   CALCIUM 9.4 02/02/2021   EGFR 87 02/02/2021   GFRNONAA 85 05/04/2019   Lab Results  Component Value Date   CHOL 163 02/02/2021   HDL 58 02/02/2021   LDLCALC 68 02/02/2021   TRIG 226 (H) 02/02/2021   CHOLHDL 2.5 11/02/2018   Lab Results  Component Value Date   TSH 1.690 12/07/2019   No results found for: HGBA1C Lab Results  Component Value Date   WBC 4.9 11/20/2012   HGB 12.2 11/20/2012  HCT 36.0 11/20/2012   MCV 91 11/20/2012   PLT 396 (H) 11/20/2012   Lab Results  Component Value Date   ALT 13 11/02/2018   AST  17 11/02/2018   ALKPHOS 68 11/02/2018   BILITOT <0.2 11/02/2018   No results found for: 25OHVITD2, 25OHVITD3, VD25OH   Review of Systems  Constitutional:  Positive for diaphoresis. Negative for irritability.  HENT:  Positive for congestion and sinus pressure. Negative for sore throat.   Respiratory:  Positive for cough. Negative for shortness of breath.   Cardiovascular:  Negative for chest pain and palpitations.  Gastrointestinal:  Negative for dysphagia, heartburn and melena.  Neurological:  Positive for headaches.  Psychiatric/Behavioral:  Positive for depression. Negative for decreased concentration and suicidal ideas. The patient is nervous/anxious.    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   Family history of malignant neoplasm of gastrointestinal tract    Benign neoplasm of ascending colon    Benign neoplasm of transverse colon    Polyp of sigmoid colon    Gastric polyp    Status post Nissen fundoplication (without gastrostomy tube) procedure    Esophageal dysphagia    Heartburn    Essential hypertension 09/03/2016   Periodic headache syndrome, not intractable 09/03/2016   Gastroesophageal reflux disease 09/03/2016   Multiple thyroid nodules 09/03/2016   Vitamin D deficiency 09/03/2016   Chronic anxiety 09/03/2016   Depression, recurrent ([place]) 09/03/2016   DDD (degenerative disc disease), lumbar 02/26/2015   Bilateral occipital neuralgia 01/27/2015   Spinal stenosis of lumbar region 11/25/2014   DDD (degenerative disc disease), cervical 11/24/2014   Status post cervical spinal fusion 11/24/2014   Lumbosacral facet joint syndrome 11/24/2014   Sacroiliac joint dysfunction of both sides 11/24/2014   Greater trochanteric bursitis of both hips 11/24/2014   Cervical facet syndrome 11/24/2014   Hyperlipidemia    Chest pain 10/26/2012   Palpitations 10/26/2012    Allergies  Allergen Reactions   Codeine Itching   Hydrocodone  Itching   Morphine Itching   Morphine And Related    Oxybutynin Itching   Oxycodone Itching   Tramadol-Acetaminophen Itching    Past Surgical History:  Procedure Laterality Date   ANTERIOR CERVICAL DECOMP/DISCECTOMY FUSION     BLADDER SUSPENSION     CARDIAC CATHETERIZATION  11/23/12   [facility];EF 65%   CARPAL TUNNEL RELEASE Bilateral    CHOLECYSTECTOMY     COLONOSCOPY WITH PROPOFOL N/A 06/13/2018   Procedure: COLONOSCOPY WITH BIOPSIES;  Surgeon: Virgel Manifold, MD;  Location: [facility];  Service: Endoscopy;  Laterality: N/A;   ESOPHAGOGASTRODUODENOSCOPY (EGD) WITH PROPOFOL N/A 06/13/2018   Procedure: ESOPHAGOGASTRODUODENOSCOPY (EGD) WITH BIOPSIES;  Surgeon: Virgel Manifold, MD;  Location: [facility];  Service: Endoscopy;  Laterality: N/A;   HERNIA REPAIR     KNEE SURGERY Left    LAPAROSCOPY     NECK SURGERY     NISSEN FUNDOPLICATION     POLYPECTOMY N/A 06/13/2018   Procedure: POLYPECTOMY;  Surgeon: Virgel Manifold, MD;  Location: [facility];  Service: Endoscopy;  Laterality: N/A;   TONSILLECTOMY     TOTAL ABDOMINAL HYSTERECTOMY      Social History   Tobacco Use   Smoking status: Former    Packs/day: 0.50    Years: 25.00    Pack years: 12.50    Types: Cigarettes    Quit date: 2018    Years since quitting: 5.4   Smokeless tobacco: Never  Vaping Use   Vaping Use: Never used  Substance Use Topics   Alcohol use: No    Alcohol/week: 0.0 standard drinks    Comment: 1 month   Drug use: No     Medication list has been reviewed and updated.  Current Meds  Medication Sig   acetaminophen (TYLENOL) 325 MG tablet Take 650 mg by mouth every 6 (six) hours as needed.   azithromycin (ZITHROMAX) 250 MG tablet Take 2 tablets on day 1, then 1 tablet daily on days 2 through 5   DULoxetine (CYMBALTA) 60 MG capsule Take 1-2 mg by mouth daily. Dr Primus Bravo   gabapentin (NEURONTIN) 600 MG tablet Take 600 mg by mouth 2 (two) times daily. Dr Primus Bravo    methadone (DOLOPHINE) 5 MG tablet Limit 2-3 tabs by mouth per day if tolerated (Patient taking differently: Take 5 mg by mouth. Limit 2-3 tabs by mouth per day if tolerated- Dr Primus Bravo)   Multiple Vitamin (MULTI-VITAMINS) TABS Take by mouth.   mupirocin ointment (BACTROBAN) 2 % Apply 1 application topically 2 (two) times daily.   tiZANidine (ZANAFLEX) 2 MG tablet Limit  2-4 tablets by mouth per day tolerated (Patient taking differently: Take 2 mg by mouth once. Limit  2-4 tablets by mouth per day tolerated/ Dr Primus Bravo)   topiramate (TOPAMAX) 50 MG tablet TAKE 1 TABLET BY MOUTH TWICE A DAY (Patient taking differently: 50 mg.)   Vitamin D, Ergocalciferol, (DRISDOL) 1.25 MG (50000 UNIT) CAPS capsule Take 1 capsule every 7 days.   [DISCONTINUED] buPROPion (WELLBUTRIN XL) 150 MG 24 hr tablet TAKE 1 TABLET BY MOUTH EVERY DAY IN THE MORNING   [DISCONTINUED] busPIRone (BUSPAR) 30 MG tablet TAKE 1 TABLET BY MOUTH 2 TIMES DAILY.   [DISCONTINUED] esomeprazole (NEXIUM) 40 MG capsule TAKE ONE CAPSULE BY MOUTH EVERY DAY AS DIRECTED BY PROVIDER   [DISCONTINUED] ezetimibe (ZETIA) 10 MG tablet TAKE 1 TABLET (10 MG TOTAL) BY MOUTH DAILY. REPORTED ON 10/15/2015   [DISCONTINUED] losartan (COZAAR) 25 MG tablet TAKE 1 TABLET (25 MG TOTAL) BY MOUTH DAILY.   [DISCONTINUED] metoprolol succinate (TOPROL-XL) 100 MG 24 hr tablet TAKE 1 TABLET BY MOUTH EVERY DAY WITH OR IMMEDIATELY FOLLOWING A MEAL   [DISCONTINUED] oxybutynin (DITROPAN-XL) 5 MG 24 hr tablet TAKE 1 TABLET BY MOUTH EVERYDAY AT BEDTIME   [DISCONTINUED] simvastatin (ZOCOR) 80 MG tablet TAKE 1 TABLET BY MOUTH EVERY DAY   Current Facility-Administered Medications for the 12/02/21 encounter (Office Visit) with Juline Patch, MD  Medication   bupivacaine (MARCAINE) 0.5 % injection 30 mL   bupivacaine (PF) (MARCAINE) 0.25 % injection 30 mL   bupivacaine (PF) (MARCAINE) 0.25 % injection 30 mL   fentaNYL (SUBLIMAZE) injection 100 mcg   fentaNYL (SUBLIMAZE) injection 100  mcg   fentaNYL (SUBLIMAZE) injection 100 mcg   lactated ringers infusion 1,000 mL   lactated ringers infusion 1,000 mL   lactated ringers infusion 1,000 mL   lactated ringers infusion 1,000 mL   lidocaine (PF) (XYLOCAINE) 1 % injection 10 mL   midazolam (VERSED) 5 MG/5ML injection 5 mg   midazolam (VERSED) 5 MG/5ML injection 5 mg   midazolam (VERSED) 5 MG/5ML injection 5 mg   orphenadrine (NORFLEX) injection 60 mg   orphenadrine (NORFLEX) injection 60 mg   orphenadrine (NORFLEX) injection 60 mg   sodium chloride 0.9 % injection 20 mL   triamcinolone acetonide (KENALOG-40) injection 40 mg   triamcinolone acetonide (KENALOG-40) injection 40 mg   triamcinolone acetonide (KENALOG-40) injection 40 mg  12/02/2021   11:09 AM 02/02/2021    2:48 PM 03/19/2020    3:00 PM 12/07/2019    3:23 PM  GAD 7 : Generalized Anxiety Score  Nervous, Anxious, on Edge 1 0 0 0  Control/stop worrying 2 0 0 0  Worry too much - different things 3 0 0 0  Trouble relaxing 2 0 0 0  Restless 1 0 0 0  Easily annoyed or irritable 2 0 0 0  Afraid - awful might happen 0 0 0 0  Total GAD 7 Score 11 0 0 0  Anxiety Difficulty Very difficult Not difficult at all  Not difficult at all       12/02/2021   11:08 AM  Depression screen PHQ 2/9  Decreased Interest 1  Down, Depressed, Hopeless 1  PHQ - 2 Score 2  Altered sleeping 2  Tired, decreased energy 2  Change in appetite 1  Feeling bad or failure about yourself  1  Trouble concentrating 2  Moving slowly or fidgety/restless 1  Suicidal thoughts 0  PHQ-9 Score 11  Difficult doing work/chores Somewhat difficult    BP Readings from Last 3 Encounters:  12/02/21 130/88  02/02/21 138/80  07/31/20 (!) 130/100    Physical Exam Vitals and nursing note reviewed. Exam conducted with a chaperone present.  Constitutional:      General: She is not in acute distress.    Appearance: She is not diaphoretic.  HENT:     Head: Normocephalic and atraumatic.      Right Ear: External ear normal.     Left Ear: External ear normal.     Nose: Nose normal.  Eyes:     General:        Right eye: No discharge.        Left eye: No discharge.     Conjunctiva/sclera: Conjunctivae normal.     Pupils: Pupils are equal, round, and reactive to light.  Neck:     Thyroid: No thyromegaly.     Vascular: No JVD.  Cardiovascular:     Rate and Rhythm: Normal rate and regular rhythm.     Pulses: Normal pulses.     Heart sounds: Normal heart sounds, S1 normal and S2 normal. No murmur heard. No systolic murmur is present.  No diastolic murmur is present.    No friction rub. No gallop. No S3 or S4 sounds.  Pulmonary:     Effort: Pulmonary effort is normal.     Breath sounds: Normal breath sounds.  Abdominal:     General: Bowel sounds are normal.     Palpations: Abdomen is soft. There is no mass.     Tenderness: There is no abdominal tenderness. There is no guarding.  Musculoskeletal:        General: Normal range of motion.     Cervical back: Normal range of motion and neck supple.  Lymphadenopathy:     Cervical: No cervical adenopathy.  Skin:    General: Skin is warm and dry.  Neurological:     Mental Status: She is alert.    Wt Readings from Last 3 Encounters:  12/02/21 185 lb (83.9 kg)  02/02/21 190 lb (86.2 kg)  07/31/20 188 lb (85.3 kg)    BP 130/88   Pulse 80   Ht 5' 10"  (1.778 m)   Wt 185 lb (83.9 kg)   BMI 26.54 kg/m   Assessment and Plan:  1. Depression, recurrent (HCC) Chronic.  Controlled.  Stable.  PHQ is 11  but this is probably situational given the multiple responsibilities that patient is juggling at this time.  We will continue Wellbutrin XL 150 mg once a day. - buPROPion (WELLBUTRIN XL) 150 MG 24 hr tablet; TAKE 1 TABLET BY MOUTH EVERY DAY IN THE MORNING  Dispense: 90 tablet; Refill: 1  2. Chronic anxiety Chronic.  Controlled.  Stable.  GAD score is 11.  Continue buspirone 30 mg twice a day. - busPIRone (BUSPAR) 30 MG tablet;  Take 1 tablet (30 mg total) by mouth 2 (two) times daily.  Dispense: 180 tablet; Refill: 1  3. Gastroesophageal reflux disease Chronic.  Controlled.  Stable. - esomeprazole (NEXIUM) 40 MG capsule; Take 1 capsule by mouth twice a day.  Dispense: 180 capsule; Refill: 1  4. Mixed hyperlipidemia Chronic.  Controlled.  Stable.  Continue Zetia 10 mg once a day and simvastatin 80 mg once a day. - ezetimibe (ZETIA) 10 MG tablet; Take 1 tablet (10 mg total) by mouth daily. Reported on 10/15/2015  Dispense: 90 tablet; Refill: 1 - simvastatin (ZOCOR) 80 MG tablet; Take 1 tablet (80 mg total) by mouth daily.  Dispense: 90 tablet; Refill: 1 - Lipid Panel With LDL/HDL Ratio  5. Essential hypertension Chronic.  Controlled.  Stable.  Blood pressure is 130/88.  Continuance of losartan 25 mg once a day metoprolol XL 100 mg once a day.  Will check CMP for electrolytes and GFR. - losartan (COZAAR) 25 MG tablet; Take 1 tablet (25 mg total) by mouth daily.  Dispense: 90 tablet; Refill: 1 - metoprolol succinate (TOPROL-XL) 100 MG 24 hr tablet; TAKE 1 TABLET BY MOUTH EVERY DAY WITH OR IMMEDIATELY FOLLOWING A MEAL  Dispense: 90 tablet; Refill: 1 - Comprehensive Metabolic Panel (CMET)  6. Urge incontinence Chronic.  Controlled.  Stable.  Continue Ditropan XL 5 mg 1 nightly. - oxybutynin (DITROPAN-XL) 5 MG 24 hr tablet; TAKE 1 TABLET BY MOUTH EVERYDAY AT BEDTIME  Dispense: 90 tablet; Refill: 1  7. Acute maxillary sinusitis, recurrence not specified New onset persistent.  Stable.  Exam and history consistent with maxillary sinus infection we will treat with azithromycin 2 tablets today followed by 1 a day for 4 days. - azithromycin (ZITHROMAX) 250 MG tablet; Take 2 tablets on day 1, then 1 tablet daily on days 2 through 5  Dispense: 6 tablet; Refill: 0  8. Colon cancer screening Discussed with patient and referral made to gastroenterology for colonoscopy. - Ambulatory referral to Gastroenterology

## 2021-12-03 LAB — COMPREHENSIVE METABOLIC PANEL
ALT: 13 IU/L (ref 0–32)
AST: 13 IU/L (ref 0–40)
Albumin/Globulin Ratio: 2 (ref 1.2–2.2)
Albumin: 4.7 g/dL (ref 3.8–4.9)
Alkaline Phosphatase: 89 IU/L (ref 44–121)
BUN/Creatinine Ratio: 18 (ref 12–28)
BUN: 14 mg/dL (ref 8–27)
Bilirubin Total: 0.2 mg/dL (ref 0.0–1.2)
CO2: 26 mmol/L (ref 20–29)
Calcium: 9.9 mg/dL (ref 8.7–10.3)
Chloride: 100 mmol/L (ref 96–106)
Creatinine, Ser: 0.76 mg/dL (ref 0.57–1.00)
Globulin, Total: 2.3 g/dL (ref 1.5–4.5)
Glucose: 116 mg/dL — ABNORMAL HIGH (ref 70–99)
Potassium: 4.7 mmol/L (ref 3.5–5.2)
Sodium: 141 mmol/L (ref 134–144)
Total Protein: 7 g/dL (ref 6.0–8.5)
eGFR: 90 mL/min/{1.73_m2} (ref >59)

## 2021-12-03 LAB — LIPID PANEL WITH LDL/HDL RATIO
Cholesterol, Total: 180 mg/dL (ref 100–199)
HDL: 63 mg/dL (ref >39)
LDL Chol Calc (NIH): 89 mg/dL (ref 0–99)
LDL/HDL Ratio: 1.4 ratio (ref 0.0–3.2)
Triglycerides: 164 mg/dL — ABNORMAL HIGH (ref 0–149)
VLDL Cholesterol Cal: 28 mg/dL (ref 5–40)

## 2021-12-04 ENCOUNTER — Telehealth: Payer: Self-pay

## 2021-12-04 NOTE — Telephone Encounter (Signed)
CALLED PATIENT NO ANSWER LEFT VOICEMAIL FOR A CALL BACK °Letter sent °

## 2021-12-22 DIAGNOSIS — M5030 Other cervical disc degeneration, unspecified cervical region: Secondary | ICD-10-CM | POA: Diagnosis not present

## 2021-12-22 DIAGNOSIS — M1990 Unspecified osteoarthritis, unspecified site: Secondary | ICD-10-CM | POA: Diagnosis not present

## 2021-12-22 DIAGNOSIS — M5481 Occipital neuralgia: Secondary | ICD-10-CM | POA: Diagnosis not present

## 2021-12-22 DIAGNOSIS — G894 Chronic pain syndrome: Secondary | ICD-10-CM | POA: Diagnosis not present

## 2022-01-03 ENCOUNTER — Other Ambulatory Visit: Payer: Self-pay | Admitting: Family Medicine

## 2022-01-03 DIAGNOSIS — G43C0 Periodic headache syndromes in child or adult, not intractable: Secondary | ICD-10-CM

## 2022-01-03 DIAGNOSIS — E559 Vitamin D deficiency, unspecified: Secondary | ICD-10-CM

## 2022-01-04 NOTE — Telephone Encounter (Signed)
Requested medications are due for refill today.  yes  Requested medications are on the active medications list.  yes  Last refill. Topamax 3/9/22023 #60 0 refills, Vit D 02/02/2021 #12 1 refill  Future visit scheduled.   yes  Notes to clinic.  Pharmacy need dx code for Topamax. Vit D not delegated    Requested Prescriptions  Pending Prescriptions Disp Refills   topiramate (TOPAMAX) 50 MG tablet [Pharmacy Med Name: TOPIRAMATE 50 MG TABLET] 60 tablet 0    Sig: TAKE 1 TABLET BY MOUTH TWICE A DAY     Neurology: Anticonvulsants - topiramate & zonisamide Passed - 01/03/2022  5:47 PM      Passed - Cr in normal range and within 360 days    Creatinine  Date Value Ref Range Status  10/02/2012 1.05 0.60 - 1.30 mg/dL Final   Creatinine, Ser  Date Value Ref Range Status  12/02/2021 0.76 0.57 - 1.00 mg/dL Final         Passed - CO2 in normal range and within 360 days    CO2  Date Value Ref Range Status  12/02/2021 26 20 - 29 mmol/L Final   Co2  Date Value Ref Range Status  10/02/2012 22 21 - 32 mmol/L Final         Passed - ALT in normal range and within 360 days    ALT  Date Value Ref Range Status  12/02/2021 13 0 - 32 IU/L Final   SGPT (ALT)  Date Value Ref Range Status  10/02/2012 22 12 - 78 U/L Final         Passed - AST in normal range and within 360 days    AST  Date Value Ref Range Status  12/02/2021 13 0 - 40 IU/L Final   SGOT(AST)  Date Value Ref Range Status  10/02/2012 18 15 - 37 Unit/L Final         Passed - Completed PHQ-2 or PHQ-9 in the last 360 days      Passed - Valid encounter within last 12 months    Recent Outpatient Visits           1 month ago Essential hypertension   [place], Deanna C, MD   11 months ago Essential hypertension   [place], Deanna C, MD   1 year ago Essential hypertension   [facility] Juline Patch, MD   1 year ago Depression, recurrent [facility])   [facility] Juline Patch, MD   1 year ago [facility] Juline Patch, MD       Future Appointments             In 5 months Juline Patch, MD [facility]             Vitamin D, Ergocalciferol, (DRISDOL) 1.25 MG (50000 UNIT) CAPS capsule [Pharmacy Med Name: VITAMIN D2 1.'25MG'$ (50,000 UNIT)] 12 capsule 1    Sig: Take 1 capsule every 7 days.     Endocrinology:  Vitamins - Vitamin D Supplementation 2 Failed - 01/03/2022  5:47 PM      Failed - Manual Review: Route requests for 50,000 IU strength to the provider      Failed - Vitamin D in normal range and within 360 days    No results found for: "[id number]", "OM7672CN4", "VD125OH2TOT", "25OHVITD3", "25OHVITD2", "25OHVITD1", "VD25OH"       Passed - Ca in normal range  and within 360 days    Calcium  Date Value Ref Range Status  12/02/2021 9.9 8.7 - 10.3 mg/dL Final   Calcium, Total  Date Value Ref Range Status  10/02/2012 9.6 8.5 - 10.1 mg/dL Final         Passed - Valid encounter within last 12 months    Recent Outpatient Visits           1 month ago Essential hypertension   [place], MD   11 months ago Essential hypertension   [place], MD   1 year ago Essential hypertension   [facility] Juline Patch, MD   1 year ago Depression, recurrent [facility])   [facility] Juline Patch, MD   1 year ago [place], MD       Future Appointments             In 5 months Juline Patch, MD [facility], [facility]

## 2022-01-08 ENCOUNTER — Other Ambulatory Visit: Payer: Self-pay | Admitting: Family Medicine

## 2022-01-08 DIAGNOSIS — E782 Mixed hyperlipidemia: Secondary | ICD-10-CM

## 2022-01-08 DIAGNOSIS — F419 Anxiety disorder, unspecified: Secondary | ICD-10-CM

## 2022-01-11 NOTE — Telephone Encounter (Signed)
Refill 5.31.23 did not go through. Requested Prescriptions  Pending Prescriptions Disp Refills  . busPIRone (BUSPAR) 30 MG tablet [Pharmacy Med Name: BUSPIRONE HCL 30 MG TABLET] 180 tablet 1    Sig: TAKE 1 TABLET BY MOUTH TWICE A DAY     Psychiatry: Anxiolytics/Hypnotics - Non-controlled Passed - 01/08/2022  8:58 PM      Passed - Valid encounter within last 12 months    Recent Outpatient Visits          1 month ago Essential hypertension   [place], MD   11 months ago Essential hypertension   [place], Deanna C, MD   1 year ago Essential hypertension   [facility] Juline Patch, MD   1 year ago Depression, recurrent [facility])   [facility] Juline Patch, MD   1 year ago [place], MD      Future Appointments            In 4 months Juline Patch, MD [facility], [place]           . simvastatin (ZOCOR) 80 MG tablet [Pharmacy Med Name: SIMVASTATIN 80 MG TABLET] 90 tablet 1    Sig: TAKE 1 TABLET BY MOUTH EVERY DAY     Cardiovascular:  Antilipid - Statins Failed - 01/08/2022  8:58 PM      Failed - Lipid Panel in normal range within the last 12 months    Cholesterol, Total  Date Value Ref Range Status  12/02/2021 180 100 - 199 mg/dL Final   LDL Chol Calc (NIH)  Date Value Ref Range Status  12/02/2021 89 0 - 99 mg/dL Final   HDL  Date Value Ref Range Status  12/02/2021 63 >39 mg/dL Final   Triglycerides  Date Value Ref Range Status  12/02/2021 164 (H) 0 - 149 mg/dL Final         Passed - Patient is not pregnant      Passed - Valid encounter within last 12 months    Recent Outpatient Visits          1 month ago Essential hypertension   [place], Deanna C, MD   11 months ago Essential hypertension   [place], Deanna C, MD   1 year ago Essential hypertension   [facility] Juline Patch, MD   1 year  ago Depression, recurrent [facility])   [facility] Juline Patch, MD   1 year ago [place], Deanna C, MD      Future Appointments            In 4 months Juline Patch, MD [facility], [facility]

## 2022-02-02 DIAGNOSIS — M5030 Other cervical disc degeneration, unspecified cervical region: Secondary | ICD-10-CM | POA: Diagnosis not present

## 2022-02-02 DIAGNOSIS — M5481 Occipital neuralgia: Secondary | ICD-10-CM | POA: Diagnosis not present

## 2022-02-02 DIAGNOSIS — M4322 Fusion of spine, cervical region: Secondary | ICD-10-CM | POA: Diagnosis not present

## 2022-02-02 DIAGNOSIS — G894 Chronic pain syndrome: Secondary | ICD-10-CM | POA: Diagnosis not present

## 2022-02-04 ENCOUNTER — Ambulatory Visit: Payer: Medicare Other | Admitting: Family Medicine

## 2022-02-06 ENCOUNTER — Other Ambulatory Visit: Payer: Self-pay | Admitting: Family Medicine

## 2022-02-06 DIAGNOSIS — E559 Vitamin D deficiency, unspecified: Secondary | ICD-10-CM

## 2022-02-08 ENCOUNTER — Other Ambulatory Visit: Payer: Self-pay | Admitting: Family Medicine

## 2022-02-08 DIAGNOSIS — G43C0 Periodic headache syndromes in child or adult, not intractable: Secondary | ICD-10-CM

## 2022-02-15 ENCOUNTER — Telehealth: Payer: Self-pay | Admitting: Family Medicine

## 2022-02-15 NOTE — Telephone Encounter (Signed)
Copied from [place] [id number]. Topic: Medicare AWV >> Feb 15, 2022 10:59 AM Jae Dire wrote: Reason for CRM:  Left message for patient to call back and schedule Medicare Annual Wellness Visit (AWV) in office.   If unable to come into the office for AWV,  please offer to do virtually or by telephone.  No hx of AWV eligible for AWVI per [employer] as of 07/05/2009  Please schedule at anytime with [facility] Advisor.      45 minute appointment   Any questions, please call me at [phone]

## 2022-03-17 DIAGNOSIS — M5030 Other cervical disc degeneration, unspecified cervical region: Secondary | ICD-10-CM | POA: Diagnosis not present

## 2022-03-17 DIAGNOSIS — M5481 Occipital neuralgia: Secondary | ICD-10-CM | POA: Diagnosis not present

## 2022-03-17 DIAGNOSIS — M4322 Fusion of spine, cervical region: Secondary | ICD-10-CM | POA: Diagnosis not present

## 2022-03-17 DIAGNOSIS — G894 Chronic pain syndrome: Secondary | ICD-10-CM | POA: Diagnosis not present

## 2022-04-09 ENCOUNTER — Other Ambulatory Visit: Payer: Self-pay | Admitting: Family Medicine

## 2022-04-09 DIAGNOSIS — I10 Essential (primary) hypertension: Secondary | ICD-10-CM

## 2022-04-09 NOTE — Telephone Encounter (Signed)
Refilled 12/02/2021 #90 1 rf. Requested Prescriptions  Pending Prescriptions Disp Refills  . losartan (COZAAR) 25 MG tablet [Pharmacy Med Name: LOSARTAN POTASSIUM 25 MG TAB] 90 tablet 1    Sig: TAKE 1 TABLET (25 MG TOTAL) BY MOUTH DAILY.     Cardiovascular:  Angiotensin Receptor Blockers Passed - 04/09/2022  2:31 PM      Passed - Cr in normal range and within 180 days    Creatinine  Date Value Ref Range Status  10/02/2012 1.05 0.60 - 1.30 mg/dL Final   Creatinine, Ser  Date Value Ref Range Status  12/02/2021 0.76 0.57 - 1.00 mg/dL Final         Passed - K in normal range and within 180 days    Potassium  Date Value Ref Range Status  12/02/2021 4.7 3.5 - 5.2 mmol/L Final  10/02/2012 3.9 3.5 - 5.1 mmol/L Final         Passed - Patient is not pregnant      Passed - Last BP in normal range    BP Readings from Last 1 Encounters:  12/02/21 130/88         Passed - Valid encounter within last 6 months    Recent Outpatient Visits          4 months ago Essential hypertension   [place] [facility] [place], Deanna C, MD   1 year ago Essential hypertension   [place] [facility] [place], Deanna C, MD   1 year ago Essential hypertension   [place] [facility] [place], Deanna C, MD   1 year ago Depression, recurrent [facility])   [place] [facility] [place], Deanna C, MD   2 years ago [place] [facility] [place], [place], MD      Future Appointments            In 2 months Juline Patch, MD [facility] at [facility], [facility]

## 2022-04-12 ENCOUNTER — Telehealth: Payer: Self-pay | Admitting: Family Medicine

## 2022-04-12 NOTE — Telephone Encounter (Signed)
Copied from [place] [phone]. Topic: Medicare AWV >> Apr 12, 2022 12:19 PM Jae Dire wrote: Reason for CRM:  Left message for patient to call back and schedule Medicare Annual Wellness Visit (AWV) in office.   If unable to come into the office for AWV,  please offer to do virtually or by telephone.  No hx of AWV eligible for AWVI per [employer] as of 07/05/2009  Please schedule at any time with [facility] Advisor.      45 minute appointment   Any questions, please call me at [phone]

## 2022-04-14 DIAGNOSIS — M5030 Other cervical disc degeneration, unspecified cervical region: Secondary | ICD-10-CM | POA: Diagnosis not present

## 2022-04-14 DIAGNOSIS — M5481 Occipital neuralgia: Secondary | ICD-10-CM | POA: Diagnosis not present

## 2022-04-14 DIAGNOSIS — M1990 Unspecified osteoarthritis, unspecified site: Secondary | ICD-10-CM | POA: Diagnosis not present

## 2022-04-14 DIAGNOSIS — G894 Chronic pain syndrome: Secondary | ICD-10-CM | POA: Diagnosis not present

## 2022-04-14 DIAGNOSIS — M4322 Fusion of spine, cervical region: Secondary | ICD-10-CM | POA: Diagnosis not present

## 2022-05-05 DIAGNOSIS — L738 Other specified follicular disorders: Secondary | ICD-10-CM | POA: Diagnosis not present

## 2022-05-05 DIAGNOSIS — L98499 Non-pressure chronic ulcer of skin of other sites with unspecified severity: Secondary | ICD-10-CM | POA: Diagnosis not present

## 2022-05-05 DIAGNOSIS — L700 Acne vulgaris: Secondary | ICD-10-CM | POA: Diagnosis not present

## 2022-05-07 ENCOUNTER — Telehealth: Payer: Self-pay | Admitting: Family Medicine

## 2022-05-07 NOTE — Telephone Encounter (Signed)
Copied from [place] [id number]. Topic: Medicare AWV >> May 07, 2022  1:57 PM Jae Dire wrote: Reason for CRM:  Left message for patient to call back and schedule Medicare Annual Wellness Visit (AWV) in office.   If unable to come into the office for AWV,  please offer to do virtually or by telephone.  No hx of AWV eligible for AWVI per [employer] as of 07/05/2009  Please schedule at any time with [facility] -Nurse Health Advisor.      45 minute appointment   Any questions, please call me at [phone]

## 2022-05-11 DIAGNOSIS — M5481 Occipital neuralgia: Secondary | ICD-10-CM | POA: Diagnosis not present

## 2022-05-11 DIAGNOSIS — M5030 Other cervical disc degeneration, unspecified cervical region: Secondary | ICD-10-CM | POA: Diagnosis not present

## 2022-05-11 DIAGNOSIS — M4322 Fusion of spine, cervical region: Secondary | ICD-10-CM | POA: Diagnosis not present

## 2022-05-11 DIAGNOSIS — G894 Chronic pain syndrome: Secondary | ICD-10-CM | POA: Diagnosis not present

## 2022-05-15 ENCOUNTER — Other Ambulatory Visit: Payer: Self-pay | Admitting: Family Medicine

## 2022-05-15 DIAGNOSIS — E559 Vitamin D deficiency, unspecified: Secondary | ICD-10-CM

## 2022-05-17 ENCOUNTER — Other Ambulatory Visit: Payer: Self-pay

## 2022-05-17 NOTE — Telephone Encounter (Signed)
Requested medications are due for refill today.  yes  Requested medications are on the active medications list.  yes  Last refill. 02/08/2022 #4 2 rf  Future visit scheduled.   yes  Notes to clinic.  Refill not delegated.    Requested Prescriptions  Pending Prescriptions Disp Refills   Vitamin D, Ergocalciferol, (DRISDOL) 1.25 MG (50000 UNIT) CAPS capsule [Pharmacy Med Name: VITAMIN D2 1.'25MG'$ (50,000 UNIT)] 12 capsule     Sig: TAKE 1 CAPSULE EVERY 7 DAYS.     Endocrinology:  Vitamins - Vitamin D Supplementation 2 Failed - 05/15/2022  3:50 PM      Failed - Manual Review: Route requests for 50,000 IU strength to the provider      Failed - Vitamin D in normal range and within 360 days    No results found for: "ZM6294TM5", "YY5035WS5", "VD125OH2TOT", "25OHVITD3", "25OHVITD2", "25OHVITD1", "VD25OH"       Passed - Ca in normal range and within 360 days    Calcium  Date Value Ref Range Status  12/02/2021 9.9 8.7 - 10.3 mg/dL Final   Calcium, Total  Date Value Ref Range Status  10/02/2012 9.6 8.5 - 10.1 mg/dL Final         Passed - Valid encounter within last 12 months    Recent Outpatient Visits           5 months ago Essential hypertension   [facility] [place], Deanna C, MD   1 year ago Essential hypertension   [facility] [place], Deanna C, MD   1 year ago Essential hypertension   [facility] [place], Deanna C, MD   1 year ago Depression, recurrent [facility])   [facility] [place], Deanna C, MD   2 years ago [place] [facility] [place], [place], MD       Future Appointments             In 3 weeks Juline Patch, MD [facility], [facility]

## 2022-05-20 DIAGNOSIS — H5213 Myopia, bilateral: Secondary | ICD-10-CM | POA: Diagnosis not present

## 2022-05-20 DIAGNOSIS — H04123 Dry eye syndrome of bilateral lacrimal glands: Secondary | ICD-10-CM | POA: Diagnosis not present

## 2022-05-20 DIAGNOSIS — H524 Presbyopia: Secondary | ICD-10-CM | POA: Diagnosis not present

## 2022-05-20 DIAGNOSIS — H16223 Keratoconjunctivitis sicca, not specified as Sjogren's, bilateral: Secondary | ICD-10-CM | POA: Diagnosis not present

## 2022-05-20 DIAGNOSIS — H52213 Irregular astigmatism, bilateral: Secondary | ICD-10-CM | POA: Diagnosis not present

## 2022-05-30 ENCOUNTER — Other Ambulatory Visit: Payer: Self-pay | Admitting: Family Medicine

## 2022-05-30 DIAGNOSIS — F339 Major depressive disorder, recurrent, unspecified: Secondary | ICD-10-CM

## 2022-05-30 DIAGNOSIS — N3941 Urge incontinence: Secondary | ICD-10-CM

## 2022-05-30 DIAGNOSIS — I10 Essential (primary) hypertension: Secondary | ICD-10-CM

## 2022-05-30 DIAGNOSIS — E782 Mixed hyperlipidemia: Secondary | ICD-10-CM

## 2022-06-07 ENCOUNTER — Ambulatory Visit: Payer: Medicare Other | Admitting: Family Medicine

## 2022-06-09 ENCOUNTER — Ambulatory Visit (INDEPENDENT_AMBULATORY_CARE_PROVIDER_SITE_OTHER): Payer: Medicare Other | Admitting: Family Medicine

## 2022-06-09 ENCOUNTER — Encounter: Payer: Self-pay | Admitting: Family Medicine

## 2022-06-09 VITALS — BP 120/78 | HR 57 | Ht 70.0 in | Wt 200.0 lb

## 2022-06-09 DIAGNOSIS — Z1211 Encounter for screening for malignant neoplasm of colon: Secondary | ICD-10-CM | POA: Diagnosis not present

## 2022-06-09 DIAGNOSIS — F339 Major depressive disorder, recurrent, unspecified: Secondary | ICD-10-CM | POA: Diagnosis not present

## 2022-06-09 DIAGNOSIS — K219 Gastro-esophageal reflux disease without esophagitis: Secondary | ICD-10-CM | POA: Diagnosis not present

## 2022-06-09 DIAGNOSIS — G8929 Other chronic pain: Secondary | ICD-10-CM | POA: Diagnosis not present

## 2022-06-09 DIAGNOSIS — F419 Anxiety disorder, unspecified: Secondary | ICD-10-CM | POA: Diagnosis not present

## 2022-06-09 DIAGNOSIS — E782 Mixed hyperlipidemia: Secondary | ICD-10-CM | POA: Diagnosis not present

## 2022-06-09 DIAGNOSIS — Z23 Encounter for immunization: Secondary | ICD-10-CM | POA: Diagnosis not present

## 2022-06-09 DIAGNOSIS — G43C0 Periodic headache syndromes in child or adult, not intractable: Secondary | ICD-10-CM | POA: Diagnosis not present

## 2022-06-09 DIAGNOSIS — N3941 Urge incontinence: Secondary | ICD-10-CM

## 2022-06-09 DIAGNOSIS — I10 Essential (primary) hypertension: Secondary | ICD-10-CM | POA: Diagnosis not present

## 2022-06-09 DIAGNOSIS — R519 Headache, unspecified: Secondary | ICD-10-CM | POA: Diagnosis not present

## 2022-06-09 MED ORDER — TOPIRAMATE 50 MG PO TABS: 50.0000 mg | ORAL_TABLET | Freq: Two times a day (BID) | ORAL | 5 refills | Status: DC

## 2022-06-09 MED ORDER — ESOMEPRAZOLE MAGNESIUM 40 MG PO CPDR: 40.0000 mg | DELAYED_RELEASE_CAPSULE | Freq: Two times a day (BID) | ORAL | 1 refills | Status: DC

## 2022-06-09 MED ORDER — METOPROLOL SUCCINATE ER 100 MG PO TB24: ORAL_TABLET | ORAL | 1 refills | Status: DC

## 2022-06-09 MED ORDER — OXYBUTYNIN CHLORIDE ER 5 MG PO TB24: ORAL_TABLET | ORAL | 1 refills | Status: DC

## 2022-06-09 MED ORDER — BUSPIRONE HCL 30 MG PO TABS: 30.0000 mg | ORAL_TABLET | Freq: Two times a day (BID) | ORAL | 1 refills | Status: DC

## 2022-06-09 MED ORDER — BUPROPION HCL ER (XL) 150 MG PO TB24: ORAL_TABLET | ORAL | 1 refills | Status: DC

## 2022-06-09 MED ORDER — LOSARTAN POTASSIUM 25 MG PO TABS: 25.0000 mg | ORAL_TABLET | Freq: Every day | ORAL | 1 refills | Status: DC

## 2022-06-09 MED ORDER — SIMVASTATIN 80 MG PO TABS: 80.0000 mg | ORAL_TABLET | Freq: Every day | ORAL | 1 refills | Status: DC

## 2022-06-09 MED ORDER — EZETIMIBE 10 MG PO TABS: 10.0000 mg | ORAL_TABLET | Freq: Every day | ORAL | 1 refills | Status: DC

## 2022-06-09 NOTE — Progress Notes (Signed)
Date:  06/09/2022   Name:  Jamie Kim   DOB:  16-Sep-1960   MRN:  [id number]   Chief Complaint: Headache, Hypertension, Hyperlipidemia, Gastroesophageal Reflux, overactive bladder, Depression, and Flu Vaccine  Headache  This is a chronic problem. The problem occurs daily. The problem has been gradually worsening. Pain location: generalized. Radiates to: cervical. The quality of the pain is described as aching. The pain is at a severity of 5/10. The pain is moderate. Associated symptoms include muscle aches, neck pain and phonophobia. Pertinent negatives include no abdominal pain, anorexia, blurred vision, dizziness, eye watering, fever, hearing loss, insomnia, loss of balance, nausea, photophobia, scalp tenderness, tingling or weakness. She has tried acetaminophen for the symptoms. The treatment provided mild ("never goes away") relief. Her past medical history is significant for hypertension.  Hypertension This is a chronic problem. Associated symptoms include anxiety, headaches and neck pain. Pertinent negatives include no blurred vision, chest pain, orthopnea, palpitations, PND or shortness of breath. Past treatments include beta blockers and angiotensin blockers. The current treatment provides moderate improvement. There are no compliance problems.  There is no history of angina, kidney disease, CAD/MI, CVA, heart failure, left ventricular hypertrophy, PVD or retinopathy. There is no history of chronic renal disease, a hypertension causing med or renovascular disease.  Hyperlipidemia This is a chronic problem. The current episode started more than 1 year ago. The problem is controlled. Recent lipid tests were reviewed and are normal. She has no history of chronic renal disease. Pertinent negatives include no chest pain, myalgias or shortness of breath.  Gastroesophageal Reflux She complains of heartburn. She reports no abdominal pain, no chest pain, no dysphagia, no nausea or no  wheezing. Pertinent negatives include no fatigue. She has tried a PPI for the symptoms. The treatment provided moderate relief.  Depression        This is a chronic problem.  Associated symptoms include headaches.  Associated symptoms include no decreased concentration, no fatigue, no helplessness, no hopelessness, does not have insomnia, not irritable, no restlessness, no decreased interest, no body aches, no myalgias, no indigestion, not sad and no suicidal ideas.  Past treatments include other medications.  Compliance with treatment is good.  Past medical history includes anxiety.   Urinary Frequency  This is a chronic problem. The current episode started more than 1 year ago. The problem has been waxing and waning (worse at night). Associated symptoms include frequency. Pertinent negatives include no flank pain or nausea.  Anxiety Presents for follow-up visit. Patient reports no chest pain, decreased concentration, dizziness, excessive worry, insomnia, irritability, nausea, nervous/anxious behavior, palpitations, restlessness, shortness of breath or suicidal ideas.      Lab Results  Component Value Date   NA 141 12/02/2021   K 4.7 12/02/2021   CO2 26 12/02/2021   GLUCOSE 116 (H) 12/02/2021   BUN 14 12/02/2021   CREATININE 0.76 12/02/2021   CALCIUM 9.9 12/02/2021   EGFR 90 12/02/2021   GFRNONAA 85 05/04/2019   Lab Results  Component Value Date   CHOL 180 12/02/2021   HDL 63 12/02/2021   LDLCALC 89 12/02/2021   TRIG 164 (H) 12/02/2021   CHOLHDL 2.5 11/02/2018   Lab Results  Component Value Date   TSH 1.690 12/07/2019   No results found for: "HGBA1C" Lab Results  Component Value Date   WBC 4.9 11/20/2012   HGB 12.2 11/20/2012   HCT 36.0 11/20/2012   MCV 91 11/20/2012   PLT 396 (H) 11/20/2012  Lab Results  Component Value Date   ALT 13 12/02/2021   AST 13 12/02/2021   ALKPHOS 89 12/02/2021   BILITOT 0.2 12/02/2021   No results found for: "25OHVITD2", "25OHVITD3",  "VD25OH"   Review of Systems  Constitutional:  Positive for unexpected weight change. Negative for fatigue, fever and irritability.  HENT:  Negative for hearing loss, nosebleeds and trouble swallowing.   Eyes:  Negative for blurred vision and photophobia.  Respiratory:  Negative for shortness of breath and wheezing.   Cardiovascular:  Negative for chest pain, palpitations, orthopnea and PND.  Gastrointestinal:  Positive for heartburn. Negative for abdominal pain, anorexia, dysphagia and nausea.  Genitourinary:  Positive for frequency. Negative for flank pain.  Musculoskeletal:  Positive for neck pain. Negative for myalgias.  Skin:  Negative for color change.  Neurological:  Positive for headaches. Negative for dizziness, tingling, weakness and loss of balance.  Psychiatric/Behavioral:  Positive for depression. Negative for decreased concentration and suicidal ideas. The patient is not nervous/anxious and does not have insomnia.     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   Family history of malignant neoplasm of gastrointestinal tract    Benign neoplasm of ascending colon    Benign neoplasm of transverse colon    Polyp of sigmoid colon    Gastric polyp    Status post Nissen fundoplication (without gastrostomy tube) procedure    Esophageal dysphagia    Heartburn    Essential hypertension 09/03/2016   Periodic headache syndrome, not intractable 09/03/2016   Gastroesophageal reflux disease 09/03/2016   Multiple thyroid nodules 09/03/2016   Vitamin D deficiency 09/03/2016   Chronic anxiety 09/03/2016   Depression, recurrent ([place]) 09/03/2016   DDD (degenerative disc disease), lumbar 02/26/2015   Bilateral occipital neuralgia 01/27/2015   Spinal stenosis of lumbar region 11/25/2014   DDD (degenerative disc disease), cervical 11/24/2014   Status post cervical spinal fusion 11/24/2014   Lumbosacral facet joint syndrome 11/24/2014   Sacroiliac  joint dysfunction of both sides 11/24/2014   Greater trochanteric bursitis of both hips 11/24/2014   Cervical facet syndrome 11/24/2014   Hyperlipidemia    Chest pain 10/26/2012   Palpitations 10/26/2012    Allergies  Allergen Reactions   Codeine Itching   Hydrocodone Itching   Morphine Itching   Morphine And Related    Oxybutynin Itching   Oxycodone Itching   Tramadol-Acetaminophen Itching    Past Surgical History:  Procedure Laterality Date   ANTERIOR CERVICAL DECOMP/DISCECTOMY FUSION     BLADDER SUSPENSION     CARDIAC CATHETERIZATION  11/23/12   [facility];EF 65%   CARPAL TUNNEL RELEASE Bilateral    CHOLECYSTECTOMY     COLONOSCOPY WITH PROPOFOL N/A 06/13/2018   Procedure: COLONOSCOPY WITH BIOPSIES;  Surgeon: Virgel Manifold, MD;  Location: [facility];  Service: Endoscopy;  Laterality: N/A;   ESOPHAGOGASTRODUODENOSCOPY (EGD) WITH PROPOFOL N/A 06/13/2018   Procedure: ESOPHAGOGASTRODUODENOSCOPY (EGD) WITH BIOPSIES;  Surgeon: Virgel Manifold, MD;  Location: [facility];  Service: Endoscopy;  Laterality: N/A;   HERNIA REPAIR     KNEE SURGERY Left    LAPAROSCOPY     NECK SURGERY     NISSEN FUNDOPLICATION     POLYPECTOMY N/A 06/13/2018   Procedure: POLYPECTOMY;  Surgeon: Virgel Manifold, MD;  Location: [facility];  Service: Endoscopy;  Laterality: N/A;   TONSILLECTOMY     TOTAL ABDOMINAL HYSTERECTOMY      Social History   Tobacco Use  Smoking status: Former    Packs/day: 0.50    Years: 25.00    Total pack years: 12.50    Types: Cigarettes    Quit date: 2018    Years since quitting: 5.9   Smokeless tobacco: Never  Vaping Use   Vaping Use: Never used  Substance Use Topics   Alcohol use: No    Alcohol/week: 0.0 standard drinks of alcohol    Comment: 1 month   Drug use: No     Medication list has been reviewed and updated.  Current Meds  Medication Sig   acetaminophen (TYLENOL) 325 MG tablet Take 650 mg by mouth every 6  (six) hours as needed.   buPROPion (WELLBUTRIN XL) 150 MG 24 hr tablet TAKE 1 TABLET BY MOUTH EVERY DAY IN THE MORNING   busPIRone (BUSPAR) 30 MG tablet TAKE 1 TABLET BY MOUTH TWICE A DAY   DULoxetine (CYMBALTA) 60 MG capsule Take 1-2 mg by mouth daily. Dr Primus Bravo   esomeprazole (NEXIUM) 40 MG capsule Take 1 capsule (40 mg total) by mouth 2 (two) times daily before a meal. Take 1 capsule by mouth every day   ezetimibe (ZETIA) 10 MG tablet TAKE 1 TABLET (10 MG TOTAL) BY MOUTH DAILY. REPORTED ON 10/15/2015   gabapentin (NEURONTIN) 600 MG tablet Take 600 mg by mouth 2 (two) times daily. Dr Primus Bravo   losartan (COZAAR) 25 MG tablet Take 1 tablet (25 mg total) by mouth daily.   methadone (DOLOPHINE) 5 MG tablet Limit 2-3 tabs by mouth per day if tolerated (Patient taking differently: Take 5 mg by mouth. Limit 2-3 tabs by mouth per day if tolerated- Dr Primus Bravo)   metoprolol succinate (TOPROL-XL) 100 MG 24 hr tablet TAKE 1 TABLET BY MOUTH EVERY DAY WITH OR IMMEDIATELY FOLLOWING A MEAL   Multiple Vitamin (MULTI-VITAMINS) TABS Take by mouth.   mupirocin ointment (BACTROBAN) 2 % Apply 1 application topically 2 (two) times daily.   oxybutynin (DITROPAN-XL) 5 MG 24 hr tablet TAKE 1 TABLET BY MOUTH EVERYDAY AT BEDTIME   simvastatin (ZOCOR) 80 MG tablet TAKE 1 TABLET BY MOUTH EVERY DAY   tiZANidine (ZANAFLEX) 2 MG tablet Limit  2-4 tablets by mouth per day tolerated (Patient taking differently: Take 2 mg by mouth once. Limit  2-4 tablets by mouth per day tolerated/ Dr Primus Bravo)   topiramate (TOPAMAX) 50 MG tablet TAKE 1 TABLET BY MOUTH TWICE A DAY   Vitamin D, Ergocalciferol, (DRISDOL) 1.25 MG (50000 UNIT) CAPS capsule TAKE 1 CAPSULE EVERY 7 DAYS.   Current Facility-Administered Medications for the 06/09/22 encounter (Office Visit) with Juline Patch, MD  Medication   bupivacaine (MARCAINE) 0.5 % injection 30 mL   bupivacaine (PF) (MARCAINE) 0.25 % injection 30 mL   bupivacaine (PF) (MARCAINE) 0.25 % injection 30 mL    fentaNYL (SUBLIMAZE) injection 100 mcg   fentaNYL (SUBLIMAZE) injection 100 mcg   fentaNYL (SUBLIMAZE) injection 100 mcg   lactated ringers infusion 1,000 mL   lactated ringers infusion 1,000 mL   lactated ringers infusion 1,000 mL   lactated ringers infusion 1,000 mL   lidocaine (PF) (XYLOCAINE) 1 % injection 10 mL   midazolam (VERSED) 5 MG/5ML injection 5 mg   midazolam (VERSED) 5 MG/5ML injection 5 mg   midazolam (VERSED) 5 MG/5ML injection 5 mg   orphenadrine (NORFLEX) injection 60 mg   orphenadrine (NORFLEX) injection 60 mg   orphenadrine (NORFLEX) injection 60 mg   sodium chloride 0.9 % injection 20 mL   triamcinolone acetonide (KENALOG-40)  injection 40 mg   triamcinolone acetonide (KENALOG-40) injection 40 mg   triamcinolone acetonide (KENALOG-40) injection 40 mg       06/09/2022   11:02 AM 12/02/2021   11:09 AM 02/02/2021    2:48 PM 03/19/2020    3:00 PM  GAD 7 : Generalized Anxiety Score  Nervous, Anxious, on Edge 0 1 0 0  Control/stop worrying 0 2 0 0  Worry too much - different things 0 3 0 0  Trouble relaxing 0 2 0 0  Restless 0 1 0 0  Easily annoyed or irritable 0 2 0 0  Afraid - awful might happen 0 0 0 0  Total GAD 7 Score 0 11 0 0  Anxiety Difficulty Not difficult at all Very difficult Not difficult at all        06/09/2022   11:02 AM 12/02/2021   11:08 AM 02/02/2021    2:48 PM  Depression screen PHQ 2/9  Decreased Interest 0 1 0  Down, Depressed, Hopeless 0 1 0  PHQ - 2 Score 0 2 0  Altered sleeping 0 2 0  Tired, decreased energy 0 2 0  Change in appetite 0 1 0  Feeling bad or failure about yourself  0 1 0  Trouble concentrating 0 2 0  Moving slowly or fidgety/restless 0 1 0  Suicidal thoughts 0 0 0  PHQ-9 Score 0 11 0  Difficult doing work/chores Not difficult at all Somewhat difficult Not difficult at all    BP Readings from Last 3 Encounters:  06/09/22 120/78  12/02/21 130/88  02/02/21 138/80    Physical Exam Vitals and nursing note  reviewed. Exam conducted with a chaperone present.  Constitutional:      General: She is not irritable.She is not in acute distress.    Appearance: She is not diaphoretic.  HENT:     Head: Normocephalic and atraumatic.     Right Ear: External ear normal.     Left Ear: External ear normal.     Nose: Nose normal.  Eyes:     General:        Right eye: No discharge.        Left eye: No discharge.     Conjunctiva/sclera: Conjunctivae normal.     Pupils: Pupils are equal, round, and reactive to light.  Neck:     Thyroid: No thyromegaly.     Vascular: No JVD.  Cardiovascular:     Rate and Rhythm: Normal rate and regular rhythm.     Heart sounds: Normal heart sounds. No murmur heard.    No friction rub. No gallop.  Pulmonary:     Effort: Pulmonary effort is normal.     Breath sounds: Normal breath sounds.  Abdominal:     General: Bowel sounds are normal.     Palpations: Abdomen is soft. There is no mass.     Tenderness: There is no abdominal tenderness. There is no guarding.  Musculoskeletal:        General: Normal range of motion.     Cervical back: Normal range of motion and neck supple.  Lymphadenopathy:     Cervical: No cervical adenopathy.  Skin:    General: Skin is warm and dry.  Neurological:     Mental Status: She is alert.     Deep Tendon Reflexes: Reflexes are normal and symmetric.     Wt Readings from Last 3 Encounters:  06/09/22 200 lb (90.7 kg)  12/02/21 185 lb (83.9 kg)  02/02/21 190 lb (86.2 kg)    BP 120/78   Pulse (!) 57   Ht _0  (1.778 m)   Wt 200 lb (90.7 kg)   SpO2 95%   BMI 28.70 kg/m   Assessment and Plan:  1. Depression, recurrent (HCC) Chronic.  Controlled.  Stable.  PHQ is 0.  Continue Wellbutrin XL 150 mg daily. - buPROPion (WELLBUTRIN XL) 150 MG 24 hr tablet; TAKE 1 TABLET BY MOUTH EVERY DAY IN THE MORNING  Dispense: 90 tablet; Refill: 1  2. Chronic anxiety Chronic.Marland Kitchen  Controlled.  Stable.  GAD score is 0.  Continue buspirone 30 mg  twice a day. - busPIRone (BUSPAR) 30 MG tablet; Take 1 tablet (30 mg total) by mouth 2 (two) times daily.  Dispense: 180 tablet; Refill: 1  3. Gastroesophageal reflux disease Chronic.  Controlled.  Stable.  Continue Nexium 40 mg twice a day. - esomeprazole (NEXIUM) 40 MG capsule; Take 1 capsule (40 mg total) by mouth 2 (two) times daily before a meal. Take 1 capsule by mouth every day  Dispense: 180 capsule; Refill: 1  4. Mixed hyperlipidemia Chronic.  Controlled.  Stable.  Tolerating statins and Zetia well continue Zetia 10 mg once a day and simvastatin  80 mg once a day.  Will check lipid panel for current level of control. - ezetimibe (ZETIA) 10 MG tablet; Take 1 tablet (10 mg total) by mouth daily. Reported on 10/15/2015  Dispense: 90 tablet; Refill: 1 - simvastatin (ZOCOR) 80 MG tablet; Take 1 tablet (80 mg total) by mouth daily.  Dispense: 90 tablet; Refill: 1 - Lipid Panel With LDL/HDL Ratio  5. Essential hypertension Chronic.  Controlled.  Stable.  Blood pressure 120/78.  Continue losartan 25 mg once a day and metoprolol XL 100 mg once a day.  Will check CMP for electrolytes and GFR. - losartan (COZAAR) 25 MG tablet; Take 1 tablet (25 mg total) by mouth daily.  Dispense: 90 tablet; Refill: 1 - metoprolol succinate (TOPROL-XL) 100 MG 24 hr tablet; Take with or immediately following a meal.  Dispense: 90 tablet; Refill: 1 - Comprehensive Metabolic Panel (CMET)  6. Urge incontinence Chronic.  Controlled.  Stable.  Continue oxybutynin XL 5 mg daily at bedtime. - oxybutynin (DITROPAN-XL) 5 MG 24 hr tablet; TAKE 1 TABLET BY MOUTH EVERYDAY AT BEDTIME  Dispense: 90 tablet; Refill: 1  7. Periodic headache syndrome, not intractable Patient desires to continue on Topamax for periodic headache syndrome we will refer to neurology for evaluation since her headaches remain intractable. - topiramate (TOPAMAX) 50 MG tablet; Take 1 tablet (50 mg total) by mouth 2 (two) times daily.  Dispense: 60  tablet; Refill: 5  8. Colon cancer screening Discussion and patient has been referred to gastroenterology for colon cancer screening. - Ambulatory referral to Gastroenterology  9. Chronic nonintractable headache, unspecified headache type Chronic.  Persistent.  Daily.  Patient has constant headache that is generalized radiates to the neck.  This sounds more of a tension headache however patient would like to see neurology for formal evaluation seen in the this is unrelenting and never resolvable by over-the-counter medications. - Ambulatory referral to Neurology  10. Need for immunization against influenza Discussed and administered. - Flu Vaccine QUAD 93moIM (Fluarix, Fluzone & Alfiuria Quad PF)    DOtilio Miu MD

## 2022-06-09 NOTE — Patient Instructions (Signed)

## 2022-06-10 LAB — COMPREHENSIVE METABOLIC PANEL
ALT: 12 IU/L (ref 0–32)
AST: 16 IU/L (ref 0–40)
Albumin/Globulin Ratio: 2.3 — ABNORMAL HIGH (ref 1.2–2.2)
Albumin: 4.3 g/dL (ref 3.8–4.9)
Alkaline Phosphatase: 72 IU/L (ref 44–121)
BUN/Creatinine Ratio: 21 (ref 12–28)
BUN: 18 mg/dL (ref 8–27)
Bilirubin Total: <0.2 mg/dL (ref 0.0–1.2)
CO2: 25 mmol/L (ref 20–29)
Calcium: 9.5 mg/dL (ref 8.7–10.3)
Chloride: 98 mmol/L (ref 96–106)
Creatinine, Ser: 0.86 mg/dL (ref 0.57–1.00)
Globulin, Total: 1.9 g/dL (ref 1.5–4.5)
Glucose: 122 mg/dL — ABNORMAL HIGH (ref 70–99)
Potassium: 4.9 mmol/L (ref 3.5–5.2)
Sodium: 137 mmol/L (ref 134–144)
Total Protein: 6.2 g/dL (ref 6.0–8.5)
eGFR: 77 mL/min/{1.73_m2} (ref >59)

## 2022-06-10 LAB — LIPID PANEL WITH LDL/HDL RATIO
Cholesterol, Total: 253 mg/dL — ABNORMAL HIGH (ref 100–199)
HDL: 55 mg/dL (ref >39)
LDL Chol Calc (NIH): 146 mg/dL — ABNORMAL HIGH (ref 0–99)
LDL/HDL Ratio: 2.7 ratio (ref 0.0–3.2)
Triglycerides: 288 mg/dL — ABNORMAL HIGH (ref 0–149)
VLDL Cholesterol Cal: 52 mg/dL — ABNORMAL HIGH (ref 5–40)

## 2022-06-15 ENCOUNTER — Other Ambulatory Visit: Payer: Self-pay

## 2022-06-15 ENCOUNTER — Telehealth: Payer: Self-pay

## 2022-06-15 DIAGNOSIS — Z86010 Personal history of colonic polyps: Secondary | ICD-10-CM

## 2022-06-15 MED ORDER — NA SULFATE-K SULFATE-MG SULF 17.5-3.13-1.6 GM/177ML PO SOLN: 1.0000 | Freq: Once | ORAL | 0 refills | Status: AC

## 2022-06-15 NOTE — Telephone Encounter (Signed)
Gastroenterology Pre-Procedure Review  Request Date: 07/09/22 Requesting Physician: Dr. Allen Norris  PATIENT REVIEW QUESTIONS: The patient responded to the following health history questions as indicated:    1. Are you having any GI issues? Patient has acid reflux and takes 40 mg prilosec BID 2. Do you have a personal history of Polyps? yes (last colonoscopy performed by Dr Bonna Gains 06/13/18) 3. Do you have a family history of Colon Cancer or Polyps? yes (father and grandfather had colon cancer) 4. Diabetes Mellitus? no 5. Joint replacements in the past 12 months?no 6. Major health problems in the past 3 months?no 7. Any artificial heart valves, MVP, or defibrillator?no    MEDICATIONS & ALLERGIES:    Patient reports the following regarding taking any anticoagulation/antiplatelet therapy:   Plavix, Coumadin, Eliquis, Xarelto, Lovenox, Pradaxa, Brilinta, or Effient? no Aspirin? no  Patient confirms/reports the following medications:  Current Outpatient Medications  Medication Sig Dispense Refill   acetaminophen (TYLENOL) 325 MG tablet Take 650 mg by mouth every 6 (six) hours as needed.     buPROPion (WELLBUTRIN XL) 150 MG 24 hr tablet TAKE 1 TABLET BY MOUTH EVERY DAY IN THE MORNING 90 tablet 1   busPIRone (BUSPAR) 30 MG tablet Take 1 tablet (30 mg total) by mouth 2 (two) times daily. 180 tablet 1   DULoxetine (CYMBALTA) 60 MG capsule Take 1-2 mg by mouth daily. Dr Primus Bravo     esomeprazole (NEXIUM) 40 MG capsule Take 1 capsule (40 mg total) by mouth 2 (two) times daily before a meal. Take 1 capsule by mouth every day 180 capsule 1   ezetimibe (ZETIA) 10 MG tablet Take 1 tablet (10 mg total) by mouth daily. Reported on 10/15/2015 90 tablet 1   gabapentin (NEURONTIN) 600 MG tablet Take 600 mg by mouth 2 (two) times daily. Dr Primus Bravo     losartan (COZAAR) 25 MG tablet Take 1 tablet (25 mg total) by mouth daily. 90 tablet 1   methadone (DOLOPHINE) 5 MG tablet Limit 2-3 tabs by mouth per day if tolerated  (Patient taking differently: Take 5 mg by mouth. Limit 2-3 tabs by mouth per day if tolerated- Dr Primus Bravo) 90 tablet 0   metoprolol succinate (TOPROL-XL) 100 MG 24 hr tablet Take with or immediately following a meal. 90 tablet 1   Multiple Vitamin (MULTI-VITAMINS) TABS Take by mouth.     mupirocin ointment (BACTROBAN) 2 % Apply 1 application topically 2 (two) times daily. 22 g 2   oxybutynin (DITROPAN-XL) 5 MG 24 hr tablet TAKE 1 TABLET BY MOUTH EVERYDAY AT BEDTIME 90 tablet 1   simvastatin (ZOCOR) 80 MG tablet Take 1 tablet (80 mg total) by mouth daily. 90 tablet 1   tiZANidine (ZANAFLEX) 2 MG tablet Limit  2-4 tablets by mouth per day tolerated (Patient taking differently: Take 2 mg by mouth once. Limit  2-4 tablets by mouth per day tolerated/ Dr Primus Bravo) 110 tablet 2   topiramate (TOPAMAX) 50 MG tablet Take 1 tablet (50 mg total) by mouth 2 (two) times daily. 60 tablet 5   Vitamin D, Ergocalciferol, (DRISDOL) 1.25 MG (50000 UNIT) CAPS capsule TAKE 1 CAPSULE EVERY 7 DAYS. 8 capsule 0   Current Facility-Administered Medications  Medication Dose Route Frequency Provider Last Rate Last Admin   bupivacaine (MARCAINE) 0.5 % injection 30 mL  30 mL Other Once Mohammed Kindle, MD       bupivacaine (PF) (MARCAINE) 0.25 % injection 30 mL  30 mL Other Once Mohammed Kindle, MD  bupivacaine (PF) (MARCAINE) 0.25 % injection 30 mL  30 mL Other Once Mohammed Kindle, MD       fentaNYL (SUBLIMAZE) injection 100 mcg  100 mcg Intravenous Once Mohammed Kindle, MD       fentaNYL (SUBLIMAZE) injection 100 mcg  100 mcg Intravenous Once Mohammed Kindle, MD       fentaNYL (SUBLIMAZE) injection 100 mcg  100 mcg Intravenous Once Mohammed Kindle, MD       lactated ringers infusion 1,000 mL  1,000 mL Intravenous Continuous Mohammed Kindle, MD       lactated ringers infusion 1,000 mL  1,000 mL Intravenous Continuous Mohammed Kindle, MD       lactated ringers infusion 1,000 mL  1,000 mL Intravenous Continuous Mohammed Kindle, MD        lactated ringers infusion 1,000 mL  1,000 mL Intravenous Continuous Mohammed Kindle, MD       lidocaine (PF) (XYLOCAINE) 1 % injection 10 mL  10 mL Subcutaneous Once Mohammed Kindle, MD       midazolam (VERSED) 5 MG/5ML injection 5 mg  5 mg Intravenous Once Mohammed Kindle, MD       midazolam (VERSED) 5 MG/5ML injection 5 mg  5 mg Intravenous Once Mohammed Kindle, MD       midazolam (VERSED) 5 MG/5ML injection 5 mg  5 mg Intravenous Once Mohammed Kindle, MD       orphenadrine (NORFLEX) injection 60 mg  60 mg Intramuscular Once Mohammed Kindle, MD       orphenadrine (NORFLEX) injection 60 mg  60 mg Intramuscular Once Mohammed Kindle, MD       orphenadrine (NORFLEX) injection 60 mg  60 mg Intramuscular Once Mohammed Kindle, MD       sodium chloride 0.9 % injection 20 mL  20 mL Other Once Mohammed Kindle, MD       triamcinolone acetonide (KENALOG-40) injection 40 mg  40 mg Other Once Mohammed Kindle, MD       triamcinolone acetonide (KENALOG-40) injection 40 mg  40 mg Other Once Mohammed Kindle, MD       triamcinolone acetonide (KENALOG-40) injection 40 mg  40 mg Other Once Mohammed Kindle, MD        Patient confirms/reports the following allergies:  Allergies  Allergen Reactions   Codeine Itching   Hydrocodone Itching   Morphine Itching   Morphine And Related    Oxybutynin Itching   Oxycodone Itching   Tramadol-Acetaminophen Itching    No orders of the defined types were placed in this encounter.   AUTHORIZATION INFORMATION Primary Insurance: 1D#: Group #:  Secondary Insurance: 1D#: Group #:  SCHEDULE INFORMATION: Date: 07/09/22 Time: Location: [place]

## 2022-06-20 ENCOUNTER — Other Ambulatory Visit: Payer: Self-pay | Admitting: Family Medicine

## 2022-06-20 DIAGNOSIS — E559 Vitamin D deficiency, unspecified: Secondary | ICD-10-CM

## 2022-06-21 NOTE — Telephone Encounter (Signed)
Requested medication (s) are due for refill today: routing for review  Requested medication (s) are on the active medication list: yes  Last refill:  05/17/22  Future visit scheduled: yes  Notes to clinic:  Manual Review: Route requests for 50,000 IU strength to the provider. Dx code needed for 90 day prescription.     Requested Prescriptions  Pending Prescriptions Disp Refills   Vitamin D, Ergocalciferol, (DRISDOL) 1.25 MG (50000 UNIT) CAPS capsule [Pharmacy Med Name: VITAMIN D2 1.'25MG'$ (50,000 UNIT)] 24 capsule 1    Sig: TAKE 1 CAPSULE EVERY 7 DAYS.     Endocrinology:  Vitamins - Vitamin D Supplementation 2 Failed - 06/20/2022 10:30 AM      Failed - Manual Review: Route requests for 50,000 IU strength to the provider      Failed - Vitamin D in normal range and within 360 days    No results found for: "MC9470JG2", "EZ6629UT6", "VD125OH2TOT", "25OHVITD3", "25OHVITD2", "25OHVITD1", "VD25OH"       Passed - Ca in normal range and within 360 days    Calcium  Date Value Ref Range Status  06/09/2022 9.5 8.7 - 10.3 mg/dL Final   Calcium, Total  Date Value Ref Range Status  10/02/2012 9.6 8.5 - 10.1 mg/dL Final         Passed - Valid encounter within last 12 months    Recent Outpatient Visits           1 week ago Essential hypertension   [facility] [place], Deanna C, MD   6 months ago Essential hypertension   [facility] [place], Deanna C, MD   1 year ago Essential hypertension   [facility] [place], [place]anna C, MD   1 year ago Essential hypertension   [facility] [place], Deanna C, MD   1 year ago Depression, recurrent [facility])   [facility] [place], [place], MD       Future Appointments             In 5 months Juline Patch, MD [facility], [facility]

## 2022-06-22 ENCOUNTER — Telehealth: Payer: Self-pay | Admitting: Family Medicine

## 2022-06-22 NOTE — Telephone Encounter (Signed)
Copied from [place] [id number]. Topic: Medicare AWV >> Jun 22, 2022 11:48 AM Devoria Glassing wrote: Reason for CRM: Left message tor patient to schedule Medicare Annual Wellness Visit (AWV) with [facility] Health Advisor.  Appointment can be an offiice/telephone or virtual visit;  Please call [phone] ask for [facility]. Left message tor patient to schedule Medicare Annual Wellness Visit (AWV) with [facility] Health Advisor.  Appointment can be an offiice/telephone or virtual visit;  Please call [phone] ask for [facility].

## 2022-06-24 ENCOUNTER — Encounter: Payer: Self-pay | Admitting: Gastroenterology

## 2022-07-01 DIAGNOSIS — H20022 Recurrent acute iridocyclitis, left eye: Secondary | ICD-10-CM | POA: Diagnosis not present

## 2022-07-07 ENCOUNTER — Telehealth: Payer: Self-pay | Admitting: Gastroenterology

## 2022-07-07 DIAGNOSIS — K219 Gastro-esophageal reflux disease without esophagitis: Secondary | ICD-10-CM

## 2022-07-07 NOTE — Telephone Encounter (Signed)
Received message patient would like to reschedule her 07/09/22 colonoscopy scheduled with Dr. Allen Norris at [facility].  Returned her phone call.  Left voice message for her to call me back to reschedule.  Thanks, [place], [place]

## 2022-07-07 NOTE — Telephone Encounter (Signed)
Patient returned phone call.  Colonoscopy has been rescheduled to 08/02/22 with Dr. Allen Norris at [facility].  Patient has requested to have an EGD added to her colonoscopy for Acid Reflux.  Last EGD performed by Dr. Bonna Gains on 06/13/18 for dysphagia and heartburn.  98m sessile gastric polyp was noted.  Please advise on adding an EGD to patients 08/02/22 colonoscopy.  Thanks,  [place]

## 2022-07-07 NOTE — Telephone Encounter (Signed)
Patient left vm requesting a call back to reschedule her upcoming procedure. States she does not have transportation to her appointment.

## 2022-07-08 NOTE — Telephone Encounter (Signed)
LVM with patient to inform her that Dr. Allen Norris has approved adding an EGD to her 08/02/22 Colonoscopy scheduled at [facility].  Kim at [facility] has been asked to add procedure on.  Referral updated and sent to [facility].  Thanks,  [place], [place]

## 2022-07-19 DIAGNOSIS — M797 Fibromyalgia: Secondary | ICD-10-CM | POA: Diagnosis not present

## 2022-07-19 DIAGNOSIS — G5602 Carpal tunnel syndrome, left upper limb: Secondary | ICD-10-CM | POA: Diagnosis not present

## 2022-07-19 DIAGNOSIS — M4310 Spondylolisthesis, site unspecified: Secondary | ICD-10-CM | POA: Diagnosis not present

## 2022-07-19 DIAGNOSIS — I10 Essential (primary) hypertension: Secondary | ICD-10-CM | POA: Diagnosis not present

## 2022-07-19 DIAGNOSIS — G894 Chronic pain syndrome: Secondary | ICD-10-CM | POA: Diagnosis not present

## 2022-07-19 DIAGNOSIS — M5030 Other cervical disc degeneration, unspecified cervical region: Secondary | ICD-10-CM | POA: Diagnosis not present

## 2022-07-19 DIAGNOSIS — G5601 Carpal tunnel syndrome, right upper limb: Secondary | ICD-10-CM | POA: Diagnosis not present

## 2022-07-27 ENCOUNTER — Encounter: Payer: Self-pay | Admitting: Gastroenterology

## 2022-07-29 ENCOUNTER — Other Ambulatory Visit: Payer: Self-pay | Admitting: Family Medicine

## 2022-07-29 DIAGNOSIS — E559 Vitamin D deficiency, unspecified: Secondary | ICD-10-CM

## 2022-07-29 NOTE — Telephone Encounter (Signed)
Requested medication (s) are due for refill today: yes  Requested medication (s) are on the active medication list: yes  Last refill:  05/17/22 #8/0  Future visit scheduled: yes  Notes to clinic:  Unable to refill per protocol, cannot delegate.    Requested Prescriptions  Pending Prescriptions Disp Refills   Vitamin D, Ergocalciferol, (DRISDOL) 1.25 MG (50000 UNIT) CAPS capsule [Pharmacy Med Name: VITAMIN D2 1.'25MG'$ (50,000 UNIT)] 8 capsule 0    Sig: TAKE 1 CAPSULE EVERY 7 DAYS.     Endocrinology:  Vitamins - Vitamin D Supplementation 2 Failed - 07/29/2022 11:27 AM      Failed - Manual Review: Route requests for 50,000 IU strength to the provider      Failed - Vitamin D in normal range and within 360 days    No results found for: "ZS8270BE6", "LJ4492EF0", "VD125OH2TOT", "07HQRFXJ8", "25OHVITD2", "83GPQDIY6", "VD25OH"       Passed - Ca in normal range and within 360 days    Calcium  Date Value Ref Range Status  06/09/2022 9.5 8.7 - 10.3 mg/dL Final   Calcium, Total  Date Value Ref Range Status  10/02/2012 9.6 8.5 - 10.1 mg/dL Final         Passed - Valid encounter within last 12 months    Recent Outpatient Visits           1 month ago Essential hypertension   [facility] [place], Deanna C, MD   7 months ago Essential hypertension   [facility] [place], Deanna C, MD   1 year ago Essential hypertension   [facility] [place], Deanna C, MD   1 year ago Essential hypertension   [facility] [place], Deanna C, MD   2 years ago Depression, recurrent [facility])   [facility] [place], [place], MD       Future Appointments             In 4 months Juline Patch, MD [place] [facility], [facility]

## 2022-08-02 ENCOUNTER — Ambulatory Visit: Payer: Medicare Other | Admitting: Anesthesiology

## 2022-08-02 ENCOUNTER — Other Ambulatory Visit: Payer: Self-pay

## 2022-08-02 ENCOUNTER — Ambulatory Visit
Admission: RE | Disposition: A | Discharge status: Home / Self Care | Payer: Self-pay | Source: Home / Self Care | Attending: Gastroenterology

## 2022-08-02 ENCOUNTER — Ambulatory Visit
Admission: RE | Admit: 2022-08-02 | Discharge: 2022-08-02 | Disposition: A | Discharge status: Home / Self Care | Payer: Medicare Other | Attending: Gastroenterology | Admitting: Gastroenterology

## 2022-08-02 ENCOUNTER — Encounter: Payer: Self-pay | Admitting: Gastroenterology

## 2022-08-02 DIAGNOSIS — K219 Gastro-esophageal reflux disease without esophagitis: Secondary | ICD-10-CM | POA: Insufficient documentation

## 2022-08-02 DIAGNOSIS — Z87891 Personal history of nicotine dependence: Secondary | ICD-10-CM | POA: Diagnosis not present

## 2022-08-02 DIAGNOSIS — Z79899 Other long term (current) drug therapy: Secondary | ICD-10-CM | POA: Diagnosis not present

## 2022-08-02 DIAGNOSIS — G4730 Sleep apnea, unspecified: Secondary | ICD-10-CM | POA: Insufficient documentation

## 2022-08-02 DIAGNOSIS — F419 Anxiety disorder, unspecified: Secondary | ICD-10-CM | POA: Insufficient documentation

## 2022-08-02 DIAGNOSIS — M797 Fibromyalgia: Secondary | ICD-10-CM | POA: Diagnosis not present

## 2022-08-02 DIAGNOSIS — Z860100 Personal history of colon polyps, unspecified: Secondary | ICD-10-CM

## 2022-08-02 DIAGNOSIS — K2950 Unspecified chronic gastritis without bleeding: Secondary | ICD-10-CM | POA: Insufficient documentation

## 2022-08-02 DIAGNOSIS — E785 Hyperlipidemia, unspecified: Secondary | ICD-10-CM | POA: Insufficient documentation

## 2022-08-02 DIAGNOSIS — K31A0 Gastric intestinal metaplasia, unspecified: Secondary | ICD-10-CM

## 2022-08-02 DIAGNOSIS — Z1211 Encounter for screening for malignant neoplasm of colon: Secondary | ICD-10-CM | POA: Diagnosis not present

## 2022-08-02 DIAGNOSIS — K449 Diaphragmatic hernia without obstruction or gangrene: Secondary | ICD-10-CM | POA: Diagnosis not present

## 2022-08-02 DIAGNOSIS — D124 Benign neoplasm of descending colon: Secondary | ICD-10-CM | POA: Insufficient documentation

## 2022-08-02 DIAGNOSIS — D126 Benign neoplasm of colon, unspecified: Secondary | ICD-10-CM | POA: Diagnosis not present

## 2022-08-02 DIAGNOSIS — F32A Depression, unspecified: Secondary | ICD-10-CM | POA: Diagnosis not present

## 2022-08-02 DIAGNOSIS — I10 Essential (primary) hypertension: Secondary | ICD-10-CM | POA: Insufficient documentation

## 2022-08-02 DIAGNOSIS — K635 Polyp of colon: Secondary | ICD-10-CM | POA: Diagnosis not present

## 2022-08-02 HISTORY — PX: COLONOSCOPY WITH PROPOFOL: SHX5780

## 2022-08-02 HISTORY — PX: ESOPHAGOGASTRODUODENOSCOPY (EGD) WITH PROPOFOL: SHX5813

## 2022-08-02 SURGERY — COLONOSCOPY WITH PROPOFOL
Anesthesia: General

## 2022-08-02 MED ORDER — SODIUM CHLORIDE 0.9 % IV SOLN: INTRAVENOUS | Status: DC

## 2022-08-02 MED ORDER — LIDOCAINE HCL (CARDIAC) PF 100 MG/5ML IV SOSY
PREFILLED_SYRINGE | INTRAVENOUS | Status: DC | PRN
  Administered 2022-08-02: 40 mg via INTRAVENOUS

## 2022-08-02 MED ORDER — LACTATED RINGERS IV SOLN: INTRAVENOUS | Status: DC

## 2022-08-02 MED ORDER — PROPOFOL 10 MG/ML IV BOLUS
INTRAVENOUS | Status: DC | PRN
  Administered 2022-08-02 (×2): 20 mg via INTRAVENOUS
  Administered 2022-08-02: 30 mg via INTRAVENOUS
  Administered 2022-08-02: 20 mg via INTRAVENOUS
  Administered 2022-08-02: 100 mg via INTRAVENOUS
  Administered 2022-08-02: 30 mg via INTRAVENOUS
  Administered 2022-08-02: 20 mg via INTRAVENOUS
  Administered 2022-08-02: 10 mg via INTRAVENOUS

## 2022-08-02 MED ORDER — STERILE WATER FOR IRRIGATION IR SOLN
Status: DC | PRN
  Administered 2022-08-02: 1000 mL

## 2022-08-02 SURGICAL SUPPLY — 36 items
BALLN DILATOR 12-15 8 (BALLOONS)
BALLN DILATOR 15-18 8 (BALLOONS)
BALLN DILATOR CRE 0-12 8 (BALLOONS)
BALLN DILATOR ESOPH 8 10 CRE (MISCELLANEOUS) IMPLANT
BALLOON DILATOR 12-15 8 (BALLOONS) IMPLANT
BALLOON DILATOR 15-18 8 (BALLOONS) IMPLANT
BALLOON DILATOR CRE 0-12 8 (BALLOONS) IMPLANT
BLOCK BITE 60FR ADLT L/F GRN (MISCELLANEOUS) ×2 IMPLANT
CLIP HMST 235XBRD CATH ROT (MISCELLANEOUS) IMPLANT
CLIP RESOLUTION 360 11X235 (MISCELLANEOUS)
ELECT REM PT RETURN 9FT ADLT (ELECTROSURGICAL)
ELECTRODE REM PT RTRN 9FT ADLT (ELECTROSURGICAL) IMPLANT
FCP ESCP3.2XJMB 240X2.8X (MISCELLANEOUS)
FORCEPS BIOP RAD 4 LRG CAP 4 (CUTTING FORCEPS) IMPLANT
FORCEPS BIOP RJ4 240 W/NDL (MISCELLANEOUS)
FORCEPS ESCP3.2XJMB 240X2.8X (MISCELLANEOUS) IMPLANT
GOWN CVR UNV OPN BCK APRN NK (MISCELLANEOUS) ×4 IMPLANT
GOWN ISOL THUMB LOOP REG UNIV (MISCELLANEOUS) ×2
INJECTOR VARIJECT VIN23 (MISCELLANEOUS) IMPLANT
KIT DEFENDO VALVE AND CONN (KITS) IMPLANT
KIT PRC NS LF DISP ENDO (KITS) ×2 IMPLANT
KIT PROCEDURE OLYMPUS (KITS) ×1
MANIFOLD NEPTUNE II (INSTRUMENTS) ×2 IMPLANT
MARKER SPOT ENDO TATTOO 5ML (MISCELLANEOUS) IMPLANT
PROBE APC STR FIRE (PROBE) IMPLANT
RETRIEVER NET PLAT FOOD (MISCELLANEOUS) IMPLANT
RETRIEVER NET ROTH 2.5X230 LF (MISCELLANEOUS) IMPLANT
SNARE COLD EXACTO (MISCELLANEOUS) IMPLANT
SNARE SHORT THROW 13M SML OVAL (MISCELLANEOUS) IMPLANT
SNARE SHORT THROW 30M LRG OVAL (MISCELLANEOUS) IMPLANT
SNARE SNG USE RND 15MM (INSTRUMENTS) IMPLANT
SYR INFLATION 60ML (SYRINGE) IMPLANT
TRAP ETRAP POLY (MISCELLANEOUS) IMPLANT
VARIJECT INJECTOR VIN23 (MISCELLANEOUS)
WATER STERILE IRR 250ML POUR (IV SOLUTION) ×2 IMPLANT
WIRE CRE 18-20MM 8CM F G (MISCELLANEOUS) IMPLANT

## 2022-08-02 NOTE — H&P (Signed)
Jamie Lame, MD [address] [place], [place] [address] Phone:[phone] Fax : [phone]  Primary Care Physician:  Juline Patch, MD Primary Gastroenterologist:  Dr. Allen Norris  Pre-Procedure History & Physical: HPI:  Jamie Kim is a 63 y.o. female is here for an endoscopy and colonoscopy.   Past Medical History:  Diagnosis Date   Allergy    Anxiety    Back pain, chronic    DDD (degenerative disc disease), cervical    lumbar   Depression    Fibromyalgia    Fibromyalgia    GERD (gastroesophageal reflux disease)    H/O scarlet fever    Heart murmur    hx   Hyperlipidemia    Hypertension    Migraines    Motion sickness    if reads in car   Multiple thyroid nodules    PONV (postoperative nausea and vomiting)    Sleep apnea    mild - no CPAP   Vitamin D deficiency    Wears dentures    partial upper    Past Surgical History:  Procedure Laterality Date   ANTERIOR CERVICAL DECOMP/DISCECTOMY FUSION     BLADDER SUSPENSION     CARDIAC CATHETERIZATION  11/23/12   [facility];EF 65%   CARPAL TUNNEL RELEASE Bilateral    CHOLECYSTECTOMY     COLONOSCOPY WITH PROPOFOL N/A 06/13/2018   Procedure: COLONOSCOPY WITH BIOPSIES;  Surgeon: Virgel Manifold, MD;  Location: [facility];  Service: Endoscopy;  Laterality: N/A;   ESOPHAGOGASTRODUODENOSCOPY (EGD) WITH PROPOFOL N/A 06/13/2018   Procedure: ESOPHAGOGASTRODUODENOSCOPY (EGD) WITH BIOPSIES;  Surgeon: Virgel Manifold, MD;  Location: [place];  Service: Endoscopy;  Laterality: N/A;   HERNIA REPAIR     KNEE SURGERY Left    LAPAROSCOPY     NECK SURGERY     NISSEN FUNDOPLICATION     POLYPECTOMY N/A 06/13/2018   Procedure: POLYPECTOMY;  Surgeon: Virgel Manifold, MD;  Location: [place];  Service: Endoscopy;  Laterality: N/A;   TONSILLECTOMY     TOTAL ABDOMINAL HYSTERECTOMY      Prior to Admission medications   Medication Sig Start Date End Date Taking? Authorizing  Provider  acetaminophen (TYLENOL) 325 MG tablet Take 650 mg by mouth every 6 (six) hours as needed.   Yes [provider]  buPROPion (WELLBUTRIN XL) 150 MG 24 hr tablet TAKE 1 TABLET BY MOUTH EVERY DAY IN THE MORNING 06/09/22  Yes Juline Patch, MD  busPIRone (BUSPAR) 30 MG tablet Take 1 tablet (30 mg total) by mouth 2 (two) times daily. 06/09/22  Yes Juline Patch, MD  DULoxetine (CYMBALTA) 60 MG capsule Take 120 mg by mouth daily.   Yes [provider]  esomeprazole (NEXIUM) 40 MG capsule Take 1 capsule (40 mg total) by mouth 2 (two) times daily before a meal. Take 1 capsule by mouth every day 06/09/22  Yes Juline Patch, MD  ezetimibe (ZETIA) 10 MG tablet Take 1 tablet (10 mg total) by mouth daily. Reported on 10/15/2015 06/09/22  Yes Juline Patch, MD  gabapentin (NEURONTIN) 600 MG tablet Take 600 mg by mouth 2 (two) times daily. Dr Primus Bravo 10/24/09  Yes [provider]  gabapentin (NEURONTIN) 600 MG tablet Take 600 mg by mouth 3 (three) times daily.   Yes [provider]  losartan (COZAAR) 25 MG tablet Take 1 tablet (25 mg total) by mouth daily. 06/09/22  Yes Juline Patch, MD  methadone (DOLOPHINE) 5 MG tablet  Limit 2-3 tabs by mouth per day if tolerated Patient taking differently: Take 5 mg by mouth. Limit 2-3 tabs by mouth per day if tolerated- Dr Primus Bravo 02/17/16  Yes Mohammed Kindle, MD  metoprolol succinate (TOPROL-XL) 100 MG 24 hr tablet Take with or immediately following a meal. 06/09/22  Yes Juline Patch, MD  Multiple Vitamin (MULTI-VITAMINS) TABS Take by mouth.   Yes [provider]  oxybutynin (DITROPAN-XL) 5 MG 24 hr tablet TAKE 1 TABLET BY MOUTH EVERYDAY AT BEDTIME 06/09/22  Yes Juline Patch, MD  simvastatin (ZOCOR) 80 MG tablet Take 1 tablet (80 mg total) by mouth daily. 06/09/22  Yes Juline Patch, MD  tiZANidine (ZANAFLEX) 2 MG tablet Limit  2-4 tablets by mouth per day tolerated Patient taking differently: Take 2 mg by mouth once.  Limit  2-4 tablets by mouth per day tolerated/ Dr Primus Bravo 02/17/16  Yes Mohammed Kindle, MD  topiramate (TOPAMAX) 50 MG tablet Take 1 tablet (50 mg total) by mouth 2 (two) times daily. 06/09/22  Yes Juline Patch, MD  Vitamin D, Ergocalciferol, (DRISDOL) 1.25 MG (50000 UNIT) CAPS capsule TAKE 1 CAPSULE EVERY 7 DAYS. 07/29/22  Yes Juline Patch, MD  mupirocin ointment (BACTROBAN) 2 % Apply 1 application topically 2 (two) times daily. Patient not taking: Reported on 06/24/2022 03/19/20   Juline Patch, MD    Allergies as of 06/15/2022 - Review Complete 06/15/2022  Allergen Reaction Noted   Codeine Itching 10/26/2012   Hydrocodone Itching 10/26/2012   Morphine Itching 10/15/2014   Morphine and related  10/26/2012   Oxybutynin Itching 10/15/2014   Oxycodone Itching 10/15/2014   Tramadol-acetaminophen Itching 10/15/2014    Family History  Problem Relation Age of Onset   Heart disease Mother    Heart attack Mother        x2   Cancer Father    Melanoma Father    Colon cancer Father    Colon cancer Paternal Grandfather    Breast cancer Neg Hx     Social History   Socioeconomic History   Marital status: Married    Spouse name: Not on file   Number of children: Not on file   Years of education: Not on file   Highest education level: Not on file  Occupational History   Not on file  Tobacco Use   Smoking status: Former    Packs/day: 0.50    Years: 25.00    Total pack years: 12.50    Types: Cigarettes, E-cigarettes    Quit date: 2018    Years since quitting: 6.0   Smokeless tobacco: Never   Tobacco comments:    07/27/22 - currently vapes  Vaping Use   Vaping Use: Every day   Substances: Nicotine, Flavoring   Devices: [place]  Substance and Sexual Activity   Alcohol use: No    Alcohol/week: 0.0 standard drinks of alcohol    Comment: 1 month   Drug use: No   Sexual activity: Not on file  Other Topics Concern   Not on file  Social History Narrative   Not on file    Social Determinants of Health   Financial Resource Strain: Not on file  Food Insecurity: Not on file  Transportation Needs: Not on file  Physical Activity: Not on file  Stress: Not on file  Social Connections: Not on file  Intimate Partner Violence: Not on file    Review of Systems: See HPI, otherwise negative ROS  Physical Exam: BP [place])  173/108   Pulse 75   Temp 98.5 F (36.9 C) (Temporal)   Resp 18   Ht '5\' 10"'$  (1.778 m)   Wt 88 kg   SpO2 99%   BMI 27.84 kg/m  General:   Alert,  pleasant and cooperative in NAD Head:  Normocephalic and atraumatic. Neck:  Supple; no masses or thyromegaly. Lungs:  Clear throughout to auscultation.    Heart:  Regular rate and rhythm. Abdomen:  Soft, nontender and nondistended. Normal bowel sounds, without guarding, and without rebound.   Neurologic:  Alert and  oriented x4;  grossly normal neurologically.  Impression/Plan: LORIE CLECKLEY is here for an endoscopy and colonoscopy to be performed for gastric intestinal metaplasia and a history of adenomatous polyps on 2019   Risks, benefits, limitations, and alternatives regarding  endoscopy and colonoscopy have been reviewed with the patient.  Questions have been answered.  All parties agreeable.   Jamie Lame, MD  08/02/2022, 8:45 AM

## 2022-08-02 NOTE — Op Note (Signed)
[facility] Gastroenterology Patient Name: Jamie Kim Procedure Date: 08/02/2022 8:46 AM MRN: [id number] Account #: [id number] Date of Birth: [birth year] Admit Type: Outpatient Age: 62 Room: [facility] OR ROOM 01 Gender: Female Note Status: Finalized Instrument Name: [id number] Procedure:             Upper GI endoscopy Indications:           Follow-up of intestinal metaplasia Providers:             Lucilla Lame MD, MD Referring MD:          Juline Patch, MD (Referring MD) Medicines:             Propofol per Anesthesia Complications:         No immediate complications. Procedure:             Pre-Anesthesia Assessment:                        - Prior to the procedure, a History and Physical was                         performed, and patient medications and allergies were                         reviewed. The patient's tolerance of previous                         anesthesia was also reviewed. The risks and benefits                         of the procedure and the sedation options and risks                         were discussed with the patient. All questions were                         answered, and informed consent was obtained. Prior                         Anticoagulants: The patient has taken no anticoagulant                         or antiplatelet agents. ASA Grade Assessment: II - A                         patient with mild systemic disease. After reviewing                         the risks and benefits, the patient was deemed in                         satisfactory condition to undergo the procedure.                        After obtaining informed consent, the endoscope was                         passed under direct vision. Throughout the procedure,  the patient's blood pressure, pulse, and oxygen                         saturations were monitored continuously. The Endoscope                         was introduced through the mouth, and  advanced to the                         second part of duodenum. The upper GI endoscopy was                         accomplished without difficulty. The patient tolerated                         the procedure well. Findings:      The examined esophagus was normal.      Patchy moderately erythematous mucosa without bleeding was found in the       gastric antrum. Biopsies were taken with a cold forceps for histology.       Multiple biopsies were obtained in the gastric body, at the incisura and       in the gastric antrum with cold forceps for histology.      The examined duodenum was normal. Impression:            - Normal esophagus.                        - Erythematous mucosa in the antrum. Biopsied.                        - Normal examined duodenum.                        - Multiple biopsies were obtained in the gastric body,                         at the incisura and in the gastric antrum. Recommendation:        - Discharge patient to home.                        - Resume previous diet.                        - Continue present medications.                        - Await pathology results.                        - Repeat upper endoscopy in 3 years for surveillance. Procedure Code(s):     --- Professional ---                        [phone], Esophagogastroduodenoscopy, flexible,                         transoral; with biopsy, single or multiple Diagnosis Code(s):     --- Professional ---  K31.A0, Gastric intestinal metaplasia, unspecified CPT copyright 2022 American Medical Association. All rights reserved. The codes documented in this report are preliminary and upon coder review may  be revised to meet current compliance requirements. Lucilla Lame MD, MD 08/02/2022 9:04:32 AM This report has been signed electronically. Number of Addenda: 0 Note Initiated On: 08/02/2022 8:46 AM Total Procedure Duration: 0 hours 4 minutes 56 seconds  Estimated Blood Loss:   Estimated blood loss: none.      [facility]

## 2022-08-02 NOTE — Op Note (Signed)
[facility] Gastroenterology Patient Name: Jamie Kim Procedure Date: 08/02/2022 8:43 AM MRN: [id number] Account #: [id number] Date of Birth: 03/23/1961 Admit Type: Outpatient Age: 62 Room: [facility] OR ROOM 01 Gender: Female Note Status: Finalized Instrument Name: [id number] Procedure:             Colonoscopy Indications:           High risk colon cancer surveillance: Personal history                         of colonic polyps Providers:             Lucilla Lame MD, MD Medicines:             Propofol per Anesthesia Complications:         No immediate complications. Procedure:             Pre-Anesthesia Assessment:                        - Prior to the procedure, a History and Physical was                         performed, and patient medications and allergies were                         reviewed. The patient's tolerance of previous                         anesthesia was also reviewed. The risks and benefits                         of the procedure and the sedation options and risks                         were discussed with the patient. All questions were                         answered, and informed consent was obtained. Prior                         Anticoagulants: The patient has taken no anticoagulant                         or antiplatelet agents. ASA Grade Assessment: II - A                         patient with mild systemic disease. After reviewing                         the risks and benefits, the patient was deemed in                         satisfactory condition to undergo the procedure.                        After obtaining informed consent, the colonoscope was                         passed under direct vision. Throughout the  procedure,                         the patient's blood pressure, pulse, and oxygen                         saturations were monitored continuously. The                         Colonoscope was introduced through the anus and                          advanced to the the cecum, identified by appendiceal                         orifice and ileocecal valve. The colonoscopy was                         performed without difficulty. The patient tolerated                         the procedure well. The quality of the bowel                         preparation was good. Findings:      The perianal and digital rectal examinations were normal.      Two sessile polyps were found in the descending colon. The polyps were 3       to 4 mm in size. These polyps were removed with a cold snare. Resection       and retrieval were complete. Impression:            - Two 3 to 4 mm polyps in the descending colon,                         removed with a cold snare. Resected and retrieved. Recommendation:        - Discharge patient to home.                        - Resume previous diet.                        - Continue present medications.                        - Await pathology results.                        - Repeat colonoscopy in 7 years for surveillance. Procedure Code(s):     --- Professional ---                        [phone], Colonoscopy, flexible; with removal of                         tumor(s), polyp(s), or other lesion(s) by snare                         technique Diagnosis Code(s):     --- Professional ---  Z86.010, Personal history of colonic polyps                        D12.4, Benign neoplasm of descending colon CPT copyright 2022 American Medical Association. All rights reserved. The codes documented in this report are preliminary and upon coder review may  be revised to meet current compliance requirements. Lucilla Lame MD, MD 08/02/2022 9:25:03 AM This report has been signed electronically. Number of Addenda: 0 Note Initiated On: 08/02/2022 8:43 AM Scope Withdrawal Time: 0 hours 7 minutes 17 seconds  Total Procedure Duration: 0 hours 17 minutes 6 seconds  Estimated Blood Loss:  Estimated blood  loss: none.      [facility]

## 2022-08-02 NOTE — Anesthesia Preprocedure Evaluation (Signed)
Anesthesia Evaluation  Patient identified by MRN, date of birth, ID band Patient awake    Reviewed: Allergy & Precautions, H&P , NPO status , Patient's Chart, lab work & pertinent test results, reviewed documented beta blocker date and time   History of Anesthesia Complications (+) PONV and history of anesthetic complications  Airway Mallampati: II  TM Distance: >3 FB Neck ROM: full    Dental  (+) Edentulous Upper, Upper Dentures, Dental Advidsory Given, Poor Dentition   Pulmonary neg shortness of breath, sleep apnea , neg COPD, neg recent URI, Patient abstained from smoking., former smoker   Pulmonary exam normal breath sounds clear to auscultation       Cardiovascular Exercise Tolerance: Good hypertension, (-) angina (-) Past MI and (-) Cardiac Stents Normal cardiovascular exam(-) dysrhythmias + Valvular Problems/Murmurs  Rhythm:regular Rate:Normal     Neuro/Psych  Headaches, neg Seizures PSYCHIATRIC DISORDERS Anxiety Depression     Neuromuscular disease (fibromyalgia)    GI/Hepatic Neg liver ROS, hiatal hernia,GERD  ,,  Endo/Other  negative endocrine ROS    Renal/GU negative Renal ROS  negative genitourinary   Musculoskeletal   Abdominal   Peds  Hematology negative hematology ROS (+)   Anesthesia Other Findings Past Medical History: No date: Allergy No date: Anxiety No date: Back pain, chronic No date: DDD (degenerative disc disease), cervical     Comment:  lumbar No date: Depression No date: Fibromyalgia No date: Fibromyalgia No date: GERD (gastroesophageal reflux disease) No date: H/O scarlet fever No date: Heart murmur     Comment:  hx No date: Hyperlipidemia No date: Hypertension No date: Migraines No date: Motion sickness     Comment:  if reads in car No date: Multiple thyroid nodules No date: PONV (postoperative nausea and vomiting) No date: Sleep apnea     Comment:  mild - no CPAP No date:  Vitamin D deficiency No date: Wears dentures     Comment:  partial upper   Reproductive/Obstetrics negative OB ROS                             Anesthesia Physical Anesthesia Plan  ASA: 2  Anesthesia Plan: General   Post-op Pain Management:    Induction: Intravenous  PONV Risk Score and Plan: 4 or greater and Propofol infusion, TIVA and Treatment may vary due to age or medical condition  Airway Management Planned: Natural Airway and Nasal Cannula  Additional Equipment:   Intra-op Plan:   Post-operative Plan:   Informed Consent: I have reviewed the patients History and Physical, chart, labs and discussed the procedure including the risks, benefits and alternatives for the proposed anesthesia with the patient or authorized representative who has indicated his/her understanding and acceptance.     Dental Advisory Given  Plan Discussed with: Anesthesiologist, CRNA and Surgeon  Anesthesia Plan Comments:         Anesthesia Quick Evaluation

## 2022-08-02 NOTE — Anesthesia Postprocedure Evaluation (Signed)
Anesthesia Post Note  Patient: Jamie Kim  Procedure(s) Performed: COLONOSCOPY WITH PROPOFOL with polypectomy ESOPHAGOGASTRODUODENOSCOPY (EGD) WITH PROPOFOL  Patient location during evaluation: Endoscopy Anesthesia Type: General Level of consciousness: awake and alert Pain management: pain level controlled Vital Signs Assessment: post-procedure vital signs reviewed and stable Respiratory status: spontaneous breathing, nonlabored ventilation, respiratory function stable and patient connected to nasal cannula oxygen Cardiovascular status: blood pressure returned to baseline and stable Postop Assessment: no apparent nausea or vomiting Anesthetic complications: no   No notable events documented.   Last Vitals:  Vitals:   08/02/22 0926 08/02/22 0930  BP: (!) 116/103 (!) 142/101  Pulse: 94   Resp: 14   Temp: 36.4 C 36.4 C  SpO2: 99%     Last Pain:  Vitals:   08/02/22 0930  TempSrc:   PainSc: 0-No pain                 Martha Clan

## 2022-08-02 NOTE — Transfer of Care (Signed)
Immediate Anesthesia Transfer of Care Note  Patient: Jamie Kim  Procedure(s) Performed: COLONOSCOPY WITH PROPOFOL with polypectomy ESOPHAGOGASTRODUODENOSCOPY (EGD) WITH PROPOFOL  Patient Location: PACU  Anesthesia Type: General  Level of Consciousness: awake, alert  and patient cooperative  Airway and Oxygen Therapy: Patient Spontanous Breathing and Patient connected to supplemental oxygen  Post-op Assessment: Post-op Vital signs reviewed, Patient's Cardiovascular Status Stable, Respiratory Function Stable, Patent Airway and No signs of Nausea or vomiting  Post-op Vital Signs: Reviewed and stable  Complications: No notable events documented.

## 2022-08-03 ENCOUNTER — Encounter: Payer: Self-pay | Admitting: Gastroenterology

## 2022-08-04 LAB — SURGICAL PATHOLOGY

## 2022-08-07 ENCOUNTER — Encounter: Payer: Self-pay | Admitting: Gastroenterology

## 2022-08-16 DIAGNOSIS — G894 Chronic pain syndrome: Secondary | ICD-10-CM | POA: Diagnosis not present

## 2022-08-16 DIAGNOSIS — M797 Fibromyalgia: Secondary | ICD-10-CM | POA: Diagnosis not present

## 2022-08-16 DIAGNOSIS — M5030 Other cervical disc degeneration, unspecified cervical region: Secondary | ICD-10-CM | POA: Diagnosis not present

## 2022-08-16 DIAGNOSIS — Z5181 Encounter for therapeutic drug level monitoring: Secondary | ICD-10-CM | POA: Diagnosis not present

## 2022-08-16 DIAGNOSIS — M1990 Unspecified osteoarthritis, unspecified site: Secondary | ICD-10-CM | POA: Diagnosis not present

## 2022-08-16 DIAGNOSIS — Z79891 Long term (current) use of opiate analgesic: Secondary | ICD-10-CM | POA: Diagnosis not present

## 2022-08-18 ENCOUNTER — Telehealth: Payer: Self-pay | Admitting: Family Medicine

## 2022-08-18 NOTE — Telephone Encounter (Signed)
Copied from [place]. Topic: Medicare AWV >> Aug 18, 2022 11:05 AM Devoria Glassing wrote:  Reason for CRM: Left message tor patient to schedule Medicare Annual Wellness Visit (AWV) with [place], [place]  Appointment can be an offiice/telephone or virtual visit;  Please call [phone] ask for Juliann Pulse.

## 2022-08-23 ENCOUNTER — Ambulatory Visit: Payer: Self-pay | Admitting: *Deleted

## 2022-08-23 NOTE — Telephone Encounter (Signed)
  Chief Complaint: SOB with exertion and skipped heart beat Symptoms: shortness of breath with exertion at times after cleaning noted skipped heart breat at times. No sleeping well only 4-6 hours a night.  Frequency: 2-3 weeks  Pertinent Negatives: Patient denies chest pain no difficulty breathing no SOB at rest  Disposition: []$ ED /[]$ Urgent Care (no appt availability in office) / [x]$ Appointment(In office/virtual)/ []$  [facility]/ []$ Home Care/ []$ Refused Recommended Disposition /[]$ [facility]/ []$  Follow-up with PCP Additional Notes:  Appt scheduled for tomorrow. Recommended go to ED if sx worsen   Reason for Disposition  [1] MILD difficulty breathing (e.g., minimal/no SOB at rest, SOB with walking, pulse <100) AND [2] NEW-onset or WORSE than normal  Answer Assessment - Initial Assessment Questions 1. RESPIRATORY STATUS: "Describe your breathing?" (e.g., wheezing, shortness of breath, unable to speak, severe coughing)      Shortness of breath with exertion 2. ONSET: "When did this breathing problem begin?"      Na  3. PATTERN "Does the difficult breathing come and go, or has it been constant since it started?"      Noted when cleaning house or movement  4. SEVERITY: "How bad is your breathing?" (e.g., mild, moderate, severe)    - MILD: No SOB at rest, mild SOB with walking, speaks normally in sentences, can lie down, no retractions, pulse < 100.    - MODERATE: SOB at rest, SOB with minimal exertion and prefers to sit, cannot lie down flat, speaks in phrases, mild retractions, audible wheezing, pulse 100-120.    - SEVERE: Very SOB at rest, speaks in single words, struggling to breathe, sitting hunched forward, retractions, pulse > 120      Reports SOB with exertion and not all of the time. Feels heart rate beat different with SOB 5. RECURRENT SYMPTOM: "Have you had difficulty breathing before?" If Yes, ask: "When was the last time?" and "What happened that time?"       na 6. CARDIAC HISTORY: "Do you have any history of heart disease?" (e.g., heart attack, angina, bypass surgery, angioplasty)      See hx  7. LUNG HISTORY: "Do you have any history of lung disease?"  (e.g., pulmonary embolus, asthma, emphysema)     na 8. CAUSE: "What do you think is causing the breathing problem?"      na 9. OTHER SYMPTOMS: "Do you have any other symptoms? (e.g., dizziness, runny nose, cough, chest pain, fever)     SOB with exertion and heart rate changes 10. O2 SATURATION MONITOR:  "Do you use an oxygen saturation monitor (pulse oximeter) at home?" If Yes, ask: "What is your reading (oxygen level) today?" "What is your usual oxygen saturation reading?" (e.g., 95%)       na 11. PREGNANCY: "Is there any chance you are pregnant?" "When was your last menstrual period?"       na 12. TRAVEL: "Have you traveled out of the country in the last month?" (e.g., travel history, exposures)       na  Protocols used: Breathing Difficulty-A-AH

## 2022-08-24 ENCOUNTER — Ambulatory Visit (INDEPENDENT_AMBULATORY_CARE_PROVIDER_SITE_OTHER): Payer: Medicare Other | Admitting: Family Medicine

## 2022-08-24 ENCOUNTER — Encounter: Payer: Self-pay | Admitting: Family Medicine

## 2022-08-24 VITALS — BP 140/100 | HR 64 | Ht 70.0 in | Wt 196.0 lb

## 2022-08-24 DIAGNOSIS — R0602 Shortness of breath: Secondary | ICD-10-CM

## 2022-08-24 DIAGNOSIS — R0989 Other specified symptoms and signs involving the circulatory and respiratory systems: Secondary | ICD-10-CM

## 2022-08-24 DIAGNOSIS — R0789 Other chest pain: Secondary | ICD-10-CM | POA: Diagnosis not present

## 2022-08-24 DIAGNOSIS — R079 Chest pain, unspecified: Secondary | ICD-10-CM | POA: Diagnosis not present

## 2022-08-24 DIAGNOSIS — I10 Essential (primary) hypertension: Secondary | ICD-10-CM | POA: Diagnosis not present

## 2022-08-24 DIAGNOSIS — Z87891 Personal history of nicotine dependence: Secondary | ICD-10-CM | POA: Diagnosis not present

## 2022-08-24 DIAGNOSIS — Z7982 Long term (current) use of aspirin: Secondary | ICD-10-CM | POA: Diagnosis not present

## 2022-08-24 DIAGNOSIS — M797 Fibromyalgia: Secondary | ICD-10-CM | POA: Diagnosis not present

## 2022-08-24 DIAGNOSIS — I160 Hypertensive urgency: Secondary | ICD-10-CM | POA: Diagnosis not present

## 2022-08-24 DIAGNOSIS — E785 Hyperlipidemia, unspecified: Secondary | ICD-10-CM | POA: Diagnosis not present

## 2022-08-24 DIAGNOSIS — K219 Gastro-esophageal reflux disease without esophagitis: Secondary | ICD-10-CM | POA: Diagnosis not present

## 2022-08-24 NOTE — Progress Notes (Signed)
Date:  08/24/2022   Name:  Jamie Kim   DOB:  08-17-1960   MRN:  [id number]   Chief Complaint: Shortness of Breath (X 2-3 weeks. )  Shortness of Breath This is a new problem. The current episode started 1 to 4 weeks ago. Episode frequency: with exertion. Associated symptoms include chest pain. Pertinent negatives include no abdominal pain, fever, leg swelling, orthopnea, PND, rhinorrhea or wheezing. The symptoms are aggravated by exercise and any activity. The patient has no known risk factors for DVT/PE.  Chest Pain  This is a new problem. The current episode started today. The problem occurs intermittently. The pain is present in the substernal region. The pain is at a severity of 4/10. The pain is moderate. The quality of the pain is described as tightness. The pain radiates to the mid back. Associated symptoms include back pain and shortness of breath. Pertinent negatives include no abdominal pain, diaphoresis, dizziness, fever, irregular heartbeat, nausea, orthopnea, palpitations or PND. The treatment provided moderate relief.    Lab Results  Component Value Date   NA 137 06/09/2022   K 4.9 06/09/2022   CO2 25 06/09/2022   GLUCOSE 122 (H) 06/09/2022   BUN 18 06/09/2022   CREATININE 0.86 06/09/2022   CALCIUM 9.5 06/09/2022   EGFR 77 06/09/2022   GFRNONAA 85 05/04/2019   Lab Results  Component Value Date   CHOL 253 (H) 06/09/2022   HDL 55 06/09/2022   LDLCALC 146 (H) 06/09/2022   TRIG 288 (H) 06/09/2022   CHOLHDL 2.5 11/02/2018   Lab Results  Component Value Date   TSH 1.690 12/07/2019   No results found for: "HGBA1C" Lab Results  Component Value Date   WBC 4.9 11/20/2012   HGB 12.2 11/20/2012   HCT 36.0 11/20/2012   MCV 91 11/20/2012   PLT 396 (H) 11/20/2012   Lab Results  Component Value Date   ALT 12 06/09/2022   AST 16 06/09/2022   ALKPHOS 72 06/09/2022   BILITOT <0.2 06/09/2022   No results found for: "25OHVITD2", "25OHVITD3", "VD25OH"    Review of Systems  Constitutional:  Negative for diaphoresis and fever.  HENT:  Negative for rhinorrhea.   Respiratory:  Positive for shortness of breath. Negative for wheezing and stridor.   Cardiovascular:  Positive for chest pain. Negative for palpitations, orthopnea, leg swelling and PND.  Gastrointestinal:  Negative for abdominal pain and nausea.  Musculoskeletal:  Positive for back pain.  Neurological:  Negative for dizziness.    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    Esophageal dysphagia    Heartburn    Essential hypertension 09/03/2016   Periodic headache syndrome, not intractable 09/03/2016   Gastroesophageal reflux disease 09/03/2016   Multiple thyroid nodules 09/03/2016   Vitamin D deficiency 09/03/2016   Chronic anxiety 09/03/2016   Depression, recurrent ([place]) 09/03/2016   DDD (degenerative disc disease), lumbar 02/26/2015   Bilateral occipital neuralgia 01/27/2015   Spinal stenosis of lumbar region 11/25/2014   DDD (degenerative disc disease), cervical 11/24/2014  Status post cervical spinal fusion 11/24/2014   Lumbosacral facet joint syndrome 11/24/2014   Sacroiliac joint dysfunction of both sides 11/24/2014   Greater trochanteric bursitis of both hips 11/24/2014   Cervical facet syndrome 11/24/2014   Hyperlipidemia    Chest pain 10/26/2012   Palpitations 10/26/2012    Allergies  Allergen Reactions   Codeine Itching   Hydrocodone Itching   Morphine Itching   Morphine And Related    Oxycodone Itching    Tramadol-Acetaminophen Itching    Past Surgical History:  Procedure Laterality Date   ANTERIOR CERVICAL DECOMP/DISCECTOMY FUSION     BLADDER SUSPENSION     CARDIAC CATHETERIZATION  11/23/12   [facility];EF 65%   CARPAL TUNNEL RELEASE Bilateral    CHOLECYSTECTOMY     COLONOSCOPY WITH PROPOFOL N/A 06/13/2018   Procedure: COLONOSCOPY WITH BIOPSIES;  Surgeon: Virgel Manifold, MD;  Location: [facility];  Service: Endoscopy;  Laterality: N/A;   COLONOSCOPY WITH PROPOFOL N/A 08/02/2022   Procedure: COLONOSCOPY WITH PROPOFOL with polypectomy;  Surgeon: Lucilla Lame, MD;  Location: [facility];  Service: Endoscopy;  Laterality: N/A;   ESOPHAGOGASTRODUODENOSCOPY (EGD) WITH PROPOFOL N/A 06/13/2018   Procedure: ESOPHAGOGASTRODUODENOSCOPY (EGD) WITH BIOPSIES;  Surgeon: Virgel Manifold, MD;  Location: [facility];  Service: Endoscopy;  Laterality: N/A;   ESOPHAGOGASTRODUODENOSCOPY (EGD) WITH PROPOFOL N/A 08/02/2022   Procedure: ESOPHAGOGASTRODUODENOSCOPY (EGD) WITH PROPOFOL;  Surgeon: Lucilla Lame, MD;  Location: [facility];  Service: Endoscopy;  Laterality: N/A;  sleep apnea   HERNIA REPAIR     KNEE SURGERY Left    LAPAROSCOPY     NECK SURGERY     NISSEN FUNDOPLICATION     POLYPECTOMY N/A 06/13/2018   Procedure: POLYPECTOMY;  Surgeon: Virgel Manifold, MD;  Location: [facility];  Service: Endoscopy;  Laterality: N/A;   TONSILLECTOMY     TOTAL ABDOMINAL HYSTERECTOMY      Social History   Tobacco Use   Smoking status: Former    Packs/day: 0.50    Years: 25.00    Total pack years: 12.50    Types: Cigarettes, E-cigarettes    Quit date: 2018    Years since quitting: 6.1   Smokeless tobacco: Never   Tobacco comments:    07/27/22 - currently vapes  Vaping Use   Vaping Use: Every day   Substances: Nicotine, Flavoring   Devices: [place]  Substance Use Topics   Alcohol use: No    Alcohol/week: 0.0 standard drinks of alcohol    Comment: 1  month   Drug use: No     Medication list has been reviewed and updated.  Current Meds  Medication Sig   acetaminophen (TYLENOL) 325 MG tablet Take 650 mg by mouth every 6 (six) hours as needed.   buPROPion (WELLBUTRIN XL) 150 MG 24 hr tablet TAKE 1 TABLET BY MOUTH EVERY DAY IN THE MORNING   busPIRone (BUSPAR) 30 MG tablet Take 1 tablet (30 mg total) by mouth 2 (two) times daily.   DULoxetine (CYMBALTA) 60 MG capsule Take 120 mg by mouth daily.   esomeprazole (NEXIUM) 40 MG capsule Take 1 capsule (40 mg total) by mouth 2 (two) times daily before a meal. Take 1 capsule by mouth every day   ezetimibe (ZETIA) 10 MG tablet Take 1 tablet (10 mg total) by mouth daily. Reported on 10/15/2015   gabapentin (NEURONTIN) 600 MG tablet Take 600 mg by mouth 3 (three) times daily.   HYDROcodone-acetaminophen (NORCO) 10-325 MG tablet Take 1 tablet  by mouth every 8 (eight) hours as needed. Dr Primus Bravo   losartan (COZAAR) 25 MG tablet Take 1 tablet (25 mg total) by mouth daily.   methadone (DOLOPHINE) 5 MG tablet Limit 2-3 tabs by mouth per day if tolerated (Patient taking differently: Take 5 mg by mouth. Limit 1 by mouth per day if tolerated- Dr Primus Bravo)   metoprolol succinate (TOPROL-XL) 100 MG 24 hr tablet Take with or immediately following a meal.   Multiple Vitamin (MULTI-VITAMINS) TABS Take by mouth.   mupirocin ointment (BACTROBAN) 2 % Apply 1 application topically 2 (two) times daily.   oxybutynin (DITROPAN-XL) 5 MG 24 hr tablet TAKE 1 TABLET BY MOUTH EVERYDAY AT BEDTIME   simvastatin (ZOCOR) 80 MG tablet Take 1 tablet (80 mg total) by mouth daily.   tiZANidine (ZANAFLEX) 2 MG tablet Limit  2-4 tablets by mouth per day tolerated (Patient taking differently: Take 2 mg by mouth once. Limit  2-4 tablets by mouth per day tolerated/ Dr Primus Bravo)   topiramate (TOPAMAX) 50 MG tablet Take 1 tablet (50 mg total) by mouth 2 (two) times daily.   Vitamin D, Ergocalciferol, (DRISDOL) 1.25 MG (50000 UNIT) CAPS capsule TAKE  1 CAPSULE EVERY 7 DAYS.   Current Facility-Administered Medications for the 08/24/22 encounter (Office Visit) with Juline Patch, MD  Medication   bupivacaine (MARCAINE) 0.5 % injection 30 mL   bupivacaine (PF) (MARCAINE) 0.25 % injection 30 mL   bupivacaine (PF) (MARCAINE) 0.25 % injection 30 mL   fentaNYL (SUBLIMAZE) injection 100 mcg   fentaNYL (SUBLIMAZE) injection 100 mcg   fentaNYL (SUBLIMAZE) injection 100 mcg   lactated ringers infusion 1,000 mL   lactated ringers infusion 1,000 mL   lactated ringers infusion 1,000 mL   lactated ringers infusion 1,000 mL   lidocaine (PF) (XYLOCAINE) 1 % injection 10 mL   midazolam (VERSED) 5 MG/5ML injection 5 mg   midazolam (VERSED) 5 MG/5ML injection 5 mg   midazolam (VERSED) 5 MG/5ML injection 5 mg   orphenadrine (NORFLEX) injection 60 mg   orphenadrine (NORFLEX) injection 60 mg   orphenadrine (NORFLEX) injection 60 mg   sodium chloride 0.9 % injection 20 mL   triamcinolone acetonide (KENALOG-40) injection 40 mg   triamcinolone acetonide (KENALOG-40) injection 40 mg   triamcinolone acetonide (KENALOG-40) injection 40 mg       08/24/2022    2:34 PM 06/09/2022   11:02 AM 12/02/2021   11:09 AM 02/02/2021    2:48 PM  GAD 7 : Generalized Anxiety Score  Nervous, Anxious, on Edge 0 0 1 0  Control/stop worrying 0 0 2 0  Worry too much - different things 0 0 3 0  Trouble relaxing 0 0 2 0  Restless 0 0 1 0  Easily annoyed or irritable 0 0 2 0  Afraid - awful might happen 0 0 0 0  Total GAD 7 Score 0 0 11 0  Anxiety Difficulty Not difficult at all Not difficult at all Very difficult Not difficult at all       08/24/2022    2:34 PM 06/09/2022   11:02 AM 12/02/2021   11:08 AM  Depression screen PHQ 2/9  Decreased Interest 0 0 1  Down, Depressed, Hopeless 0 0 1  PHQ - 2 Score 0 0 2  Altered sleeping 0 0 2  Tired, decreased energy 0 0 2  Change in appetite 0 0 1  Feeling bad or failure about yourself  0 0 1  Trouble concentrating 0 0 2  Moving slowly or fidgety/restless 0 0 1  Suicidal thoughts 0 0 0  PHQ-9 Score 0 0 11  Difficult doing work/chores Not difficult at all Not difficult at all Somewhat difficult    BP Readings from Last 3 Encounters:  08/24/22 (!) 140/100  08/02/22 (!) 142/101  06/09/22 120/78    Physical Exam Vitals and nursing note reviewed.  HENT:     Head: Normocephalic.     Mouth/Throat:     Mouth: Mucous membranes are moist.  Cardiovascular:     Heart sounds: S1 normal and S2 normal.     No systolic murmur is present.     No diastolic murmur is present.     No gallop. No S3 or S4 sounds.  Pulmonary:     Effort: Pulmonary effort is normal.     Breath sounds: Normal breath sounds. No decreased breath sounds, wheezing, rhonchi or rales.  Chest:     Chest wall: Tenderness present.     Comments: chronic Abdominal:     Palpations: There is no hepatomegaly or splenomegaly.  Musculoskeletal:     Cervical back: Normal range of motion and neck supple.  Neurological:     Mental Status: She is alert.     Wt Readings from Last 3 Encounters:  08/24/22 196 lb (88.9 kg)  08/02/22 194 lb (88 kg)  06/09/22 200 lb (90.7 kg)    BP (!) 140/100 (Cuff Size: Large)   Pulse 64   Ht 5' 10"$  (1.778 m)   Wt 196 lb (88.9 kg)   SpO2 98%   BMI 28.12 kg/m   Assessment and Plan: 1. Chest pain at rest New onset of tightness chest pain this morning lasting about 3 to 4 minutes radiating to the back.  Not associated with other symptomatology.  EKG was done with the following results sinus rhythm rate 55.  Intervals normal.  No Q waves noted no delay in R wave progression.  T wave with verted in V1 V2 which is also seen an EKG that was done in 2014 suggesting an old anterior lateral.  There is T wave abnormality in 3 and aVF but not in 2 and this was not present on EKG that was done in 2014.  While in the room patient had another episode of chest tightness which lasted a few seconds and so I have suggested that  patient go to the emergency room of choice for evaluation and likely troponins.  In the meantime she has seen Dr. Sophronia Simas in the past and we have placed a referral for cardiology. - Ambulatory referral to Cardiology - EKG 12-Lead  2. SOB (shortness of breath) New onset shortness of breath patient's not had any leg swelling it is a possibility that patient may have a pulmonary embolus but there is no other symptomatology to suggest this. - Ambulatory referral to Cardiology - EKG 12-Lead  3. Decreased carotid pulse Patient is also concerned about a decrease in carotid pulse on the left side relative to the right from what she can tell.  I find that there are palpable but will progress with carotid Doppler to rule out significant carotid stenosis. - US Carotid Duplex Bilateral; Future     Otilio Miu, MD

## 2022-08-25 ENCOUNTER — Telehealth: Payer: Self-pay

## 2022-08-25 DIAGNOSIS — R079 Chest pain, unspecified: Secondary | ICD-10-CM | POA: Diagnosis not present

## 2022-08-25 NOTE — Transitions of Care (Post Inpatient/ED Visit) (Signed)
   08/26/2022  Name: Jamie Kim MRN: [id number] DOB: March 18, 1961  Today's TOC FU Call Status: Today's TOC FU Call Status:: Unsuccessful Call (2nd Attempt) Unsuccessful Call (1st Attempt) Date: 08/25/22 Unsuccessful Call (2nd Attempt) Date: 08/26/22  Attempted to reach the patient regarding the most recent Inpatient/ED visit.  Follow Up Plan: Additional outreach attempts will be made to reach the patient to complete the Transitions of Care (Post Inpatient/ED visit) call.   Signature Berton Mount

## 2022-08-26 DIAGNOSIS — R079 Chest pain, unspecified: Secondary | ICD-10-CM | POA: Diagnosis not present

## 2022-08-27 ENCOUNTER — Telehealth: Payer: Self-pay | Admitting: Family Medicine

## 2022-08-27 DIAGNOSIS — R079 Chest pain, unspecified: Secondary | ICD-10-CM | POA: Diagnosis not present

## 2022-08-27 DIAGNOSIS — I10 Essential (primary) hypertension: Secondary | ICD-10-CM | POA: Diagnosis not present

## 2022-08-27 NOTE — Telephone Encounter (Signed)
Copied from [place] [id number]. Topic: General - Other >> Aug 27, 2022  4:20 PM Ja-Kwan M wrote: Reason for CRM: Pt stated she was returning call to [facility] to make her aware that she spent the night at the hospital on Tuesday due to elevated blood pressure. Pt stated she saw cardiologist today and had some test done.

## 2022-08-27 NOTE — Transitions of Care (Post Inpatient/ED Visit) (Signed)
   08/27/2022  Name: ANIFER ZENTGRAF MRN: [id number] DOB: Dec 13, 1960  Today's TOC FU Call Status: Today's TOC FU Call Status:: Unsuccessful Call (3rd Attempt) Unsuccessful Call (1st Attempt) Date: 08/25/22 Unsuccessful Call (2nd Attempt) Date: 08/26/22 Unsuccessful Call (3rd Attempt) Date: 08/27/22  Attempted to reach the patient regarding the most recent Inpatient/ED visit.  Follow Up Plan: No further outreach attempts will be made at this time. We have been unable to contact the patient.  Signature [employer], CMA

## 2022-08-29 ENCOUNTER — Other Ambulatory Visit: Payer: Self-pay | Admitting: Family Medicine

## 2022-08-29 DIAGNOSIS — E559 Vitamin D deficiency, unspecified: Secondary | ICD-10-CM

## 2022-09-08 ENCOUNTER — Ambulatory Visit: Payer: Self-pay

## 2022-09-08 NOTE — Telephone Encounter (Signed)
Spoke with patient- told her we are unable to call in her pain medications prescribed by the pain clinic. Patient told to got to ER to see if they will give her enough medication to get her to Monday when her pain management clinic opens. Dink Creps

## 2022-09-08 NOTE — Telephone Encounter (Signed)
PEC agent Danielle stated pt cannot reach her pan med doctor and needs refills on these meds. Pt is down to 1 pill and is afraid she might go into withdrawal.  Spoke with Levada Dy regarding med refill request.  Reason for Disposition  Caller requesting a CONTROLLED substance prescription refill (e.g., narcotics, ADHD medicines)  Answer Assessment - Initial Assessment Questions 1. DRUG NAME: "What medicine do you need to have refilled?"     Methadone and Norco 2. REFILLS REMAINING: "How many refills are remaining?" (Note: The label on the medicine or pill bottle will show how many refills are remaining. If there are no refills remaining, then a renewal may be needed.)     none 4. PRESCRIBING HCP: "Who prescribed it?" Reason: If prescribed by specialist, call should be referred to that group.     Pain med docotr 5. SYMPTOMS: "Do you have any symptoms?"     pain  Protocols used: Medication Refill and Renewal Call-A-AH

## 2022-09-08 NOTE — Telephone Encounter (Signed)
Pt hung up prior to transfer. Called pt back and LM on VM to call back.

## 2022-09-10 DIAGNOSIS — E785 Hyperlipidemia, unspecified: Secondary | ICD-10-CM | POA: Diagnosis not present

## 2022-09-10 DIAGNOSIS — Z8249 Family history of ischemic heart disease and other diseases of the circulatory system: Secondary | ICD-10-CM | POA: Diagnosis not present

## 2022-09-10 DIAGNOSIS — Z885 Allergy status to narcotic agent status: Secondary | ICD-10-CM | POA: Diagnosis not present

## 2022-09-10 DIAGNOSIS — G8929 Other chronic pain: Secondary | ICD-10-CM | POA: Diagnosis not present

## 2022-09-10 DIAGNOSIS — Z87891 Personal history of nicotine dependence: Secondary | ICD-10-CM | POA: Diagnosis not present

## 2022-09-10 DIAGNOSIS — Z823 Family history of stroke: Secondary | ICD-10-CM | POA: Diagnosis not present

## 2022-09-10 DIAGNOSIS — M797 Fibromyalgia: Secondary | ICD-10-CM | POA: Diagnosis not present

## 2022-09-10 DIAGNOSIS — K219 Gastro-esophageal reflux disease without esophagitis: Secondary | ICD-10-CM | POA: Diagnosis not present

## 2022-09-10 DIAGNOSIS — R9431 Abnormal electrocardiogram [ECG] [EKG]: Secondary | ICD-10-CM | POA: Diagnosis not present

## 2022-09-10 DIAGNOSIS — R079 Chest pain, unspecified: Secondary | ICD-10-CM | POA: Diagnosis not present

## 2022-09-10 DIAGNOSIS — I10 Essential (primary) hypertension: Secondary | ICD-10-CM | POA: Diagnosis not present

## 2022-09-10 DIAGNOSIS — Z833 Family history of diabetes mellitus: Secondary | ICD-10-CM | POA: Diagnosis not present

## 2022-09-10 DIAGNOSIS — Z760 Encounter for issue of repeat prescription: Secondary | ICD-10-CM | POA: Diagnosis not present

## 2022-09-14 DIAGNOSIS — M5030 Other cervical disc degeneration, unspecified cervical region: Secondary | ICD-10-CM | POA: Diagnosis not present

## 2022-09-14 DIAGNOSIS — G894 Chronic pain syndrome: Secondary | ICD-10-CM | POA: Diagnosis not present

## 2022-09-14 DIAGNOSIS — M4310 Spondylolisthesis, site unspecified: Secondary | ICD-10-CM | POA: Diagnosis not present

## 2022-09-15 ENCOUNTER — Telehealth: Payer: Self-pay

## 2022-09-15 NOTE — Telephone Encounter (Signed)
Called pt to schedule AWV. Pt phone is not in service.  KP

## 2022-10-20 DIAGNOSIS — Z7689 Persons encountering health services in other specified circumstances: Secondary | ICD-10-CM | POA: Diagnosis not present

## 2022-10-20 DIAGNOSIS — M797 Fibromyalgia: Secondary | ICD-10-CM | POA: Diagnosis not present

## 2022-10-20 DIAGNOSIS — M542 Cervicalgia: Secondary | ICD-10-CM | POA: Diagnosis not present

## 2022-10-20 DIAGNOSIS — R519 Headache, unspecified: Secondary | ICD-10-CM | POA: Diagnosis not present

## 2022-10-20 DIAGNOSIS — R42 Dizziness and giddiness: Secondary | ICD-10-CM | POA: Diagnosis not present

## 2022-10-29 DIAGNOSIS — Z131 Encounter for screening for diabetes mellitus: Secondary | ICD-10-CM | POA: Diagnosis not present

## 2022-10-29 DIAGNOSIS — I10 Essential (primary) hypertension: Secondary | ICD-10-CM | POA: Diagnosis not present

## 2022-10-29 DIAGNOSIS — R0602 Shortness of breath: Secondary | ICD-10-CM | POA: Diagnosis not present

## 2022-10-29 DIAGNOSIS — E785 Hyperlipidemia, unspecified: Secondary | ICD-10-CM | POA: Diagnosis not present

## 2022-11-16 DIAGNOSIS — M5030 Other cervical disc degeneration, unspecified cervical region: Secondary | ICD-10-CM | POA: Diagnosis not present

## 2022-11-16 DIAGNOSIS — G894 Chronic pain syndrome: Secondary | ICD-10-CM | POA: Diagnosis not present

## 2022-11-16 DIAGNOSIS — Z5181 Encounter for therapeutic drug level monitoring: Secondary | ICD-10-CM | POA: Diagnosis not present

## 2022-11-16 DIAGNOSIS — I10 Essential (primary) hypertension: Secondary | ICD-10-CM | POA: Diagnosis not present

## 2022-11-16 DIAGNOSIS — Z79891 Long term (current) use of opiate analgesic: Secondary | ICD-10-CM | POA: Diagnosis not present

## 2022-11-16 DIAGNOSIS — G5602 Carpal tunnel syndrome, left upper limb: Secondary | ICD-10-CM | POA: Diagnosis not present

## 2022-12-02 ENCOUNTER — Ambulatory Visit: Payer: Medicare Other | Admitting: Family Medicine

## 2022-12-04 ENCOUNTER — Other Ambulatory Visit: Payer: Self-pay | Admitting: Family Medicine

## 2022-12-04 DIAGNOSIS — E559 Vitamin D deficiency, unspecified: Secondary | ICD-10-CM

## 2022-12-06 NOTE — Telephone Encounter (Signed)
Requested medication (s) are due for refill today: yes  Requested medication (s) are on the active medication list: yes  Last refill:  08/30/22  Future visit scheduled: yes  Notes to clinic:   Manual Review: Route requests for 50,000 IU strength to the provider      Requested Prescriptions  Pending Prescriptions Disp Refills   Vitamin D, Ergocalciferol, (DRISDOL) 1.25 MG (50000 UNIT) CAPS capsule [Pharmacy Med Name: VITAMIN D2 1.25MG (50,000 UNIT)] 8 capsule 0    Sig: TAKE 1 CAPSULE EVERY 7 DAYS.     Endocrinology:  Vitamins - Vitamin D Supplementation 2 Failed - 12/04/2022  6:39 PM      Failed - Manual Review: Route requests for 50,000 IU strength to the provider      Failed - Vitamin D in normal range and within 360 days    No results found for: "ZO1096EA5", "[id number]", "[id number]", "25OHVITD3", "25OHVITD2", "25OHVITD1", "VD25OH"       Passed - Ca in normal range and within 360 days    Calcium  Date Value Ref Range Status  06/09/2022 9.5 8.7 - 10.3 mg/dL Final   Calcium, Total  Date Value Ref Range Status  10/02/2012 9.6 8.5 - 10.1 mg/dL Final         Passed - Valid encounter within last 12 months    Recent Outpatient Visits           3 months ago Chest pain at rest   [facility]   6 months ago Essential hypertension   [facility]   1 year ago Essential hypertension   [place] [facility]   1 year ago Essential hypertension   [facility]   2 years ago Essential hypertension   [place] [facility]       Future Appointments             In 3 weeks Duanne Limerick, MD [facility] at [facility]

## 2022-12-09 ENCOUNTER — Ambulatory Visit: Payer: Medicare Other | Admitting: Family Medicine

## 2022-12-14 ENCOUNTER — Other Ambulatory Visit: Payer: Self-pay

## 2022-12-14 ENCOUNTER — Telehealth: Payer: Self-pay

## 2022-12-14 NOTE — Telephone Encounter (Signed)
Called pt left VM to call back. Pt needs to schedule AWV. Please forward the call to the office. Ask to speak to [facility] to schedule.  KP

## 2022-12-15 ENCOUNTER — Other Ambulatory Visit: Payer: Self-pay | Admitting: Family Medicine

## 2022-12-15 DIAGNOSIS — K219 Gastro-esophageal reflux disease without esophagitis: Secondary | ICD-10-CM

## 2022-12-16 ENCOUNTER — Other Ambulatory Visit: Payer: Self-pay | Admitting: Family Medicine

## 2022-12-16 ENCOUNTER — Telehealth: Payer: Self-pay | Admitting: Family Medicine

## 2022-12-16 DIAGNOSIS — K219 Gastro-esophageal reflux disease without esophagitis: Secondary | ICD-10-CM

## 2022-12-16 DIAGNOSIS — Z1231 Encounter for screening mammogram for malignant neoplasm of breast: Secondary | ICD-10-CM

## 2022-12-16 NOTE — Telephone Encounter (Signed)
Patient called asking to speak to [facility] about her AWV. Patient would like her AWV done at her office visit on 12/30/22 when she comes into the office. I did look but did not see an available appt time for that date. FYI

## 2022-12-17 NOTE — Telephone Encounter (Signed)
Requested medication (s) are due for refill today: routing for review  Requested medication (s) are on the active medication list: yes  Last refill:  12/16/22  Future visit scheduled: yes  Notes to clinic:  Pharmacy comment: Script Clarification:2 SIGS ON RX, RESEND WITH ONE SET OF DIRECTIONS      Requested Prescriptions  Pending Prescriptions Disp Refills   esomeprazole (NEXIUM) 40 MG capsule [Pharmacy Med Name: ESOMEPRAZOLE MAG DR 40 MG CAP] 180 capsule 0    Sig: TAKE 1 CAP BY MOUTH 2 (TWO) TIMES DAILY BEFORE A MEAL. TAKE 1 CAPSULE BY MOUTH EVERY DAY     Gastroenterology: Proton Pump Inhibitors 2 Passed - 12/16/2022  1:07 PM      Passed - ALT in normal range and within 360 days    ALT  Date Value Ref Range Status  06/09/2022 12 0 - 32 IU/L Final   SGPT (ALT)  Date Value Ref Range Status  10/02/2012 22 12 - 78 U/L Final         Passed - AST in normal range and within 360 days    AST  Date Value Ref Range Status  06/09/2022 16 0 - 40 IU/L Final   SGOT(AST)  Date Value Ref Range Status  10/02/2012 18 15 - 37 Unit/L Final         Passed - Valid encounter within last 12 months    Recent Outpatient Visits           3 months ago Chest pain at rest   [facility] at [facility], [facility]   6 months ago Essential hypertension   [place] [facility] Medicine at [facility]   1 year ago Essential hypertension   [place] [facility] & [facility] Medicine at [facility], [facility]   1 year ago Essential hypertension   [place] [facility] & Sports Medicine at [facility], [facility]   2 years ago Essential hypertension   [place] Primary [facility] & Sports Medicine at [facility], [facility]       Future Appointments             In 1 week Duanne Limerick, MD [facility] Primary Care & Sports Medicine at [facility], [facility]

## 2022-12-20 DIAGNOSIS — R0602 Shortness of breath: Secondary | ICD-10-CM | POA: Diagnosis not present

## 2022-12-21 DIAGNOSIS — I10 Essential (primary) hypertension: Secondary | ICD-10-CM | POA: Diagnosis not present

## 2022-12-21 DIAGNOSIS — G894 Chronic pain syndrome: Secondary | ICD-10-CM | POA: Diagnosis not present

## 2022-12-21 DIAGNOSIS — G5602 Carpal tunnel syndrome, left upper limb: Secondary | ICD-10-CM | POA: Diagnosis not present

## 2022-12-21 DIAGNOSIS — M5030 Other cervical disc degeneration, unspecified cervical region: Secondary | ICD-10-CM | POA: Diagnosis not present

## 2022-12-21 NOTE — Telephone Encounter (Signed)
Noted  KP 

## 2022-12-28 DIAGNOSIS — R200 Anesthesia of skin: Secondary | ICD-10-CM | POA: Diagnosis not present

## 2022-12-30 ENCOUNTER — Other Ambulatory Visit: Payer: Self-pay | Admitting: Family Medicine

## 2022-12-30 ENCOUNTER — Ambulatory Visit: Payer: Medicare Other | Admitting: Family Medicine

## 2022-12-30 DIAGNOSIS — I10 Essential (primary) hypertension: Secondary | ICD-10-CM

## 2022-12-30 DIAGNOSIS — E782 Mixed hyperlipidemia: Secondary | ICD-10-CM

## 2022-12-30 NOTE — Telephone Encounter (Signed)
Requested Prescriptions  Pending Prescriptions Disp Refills   ezetimibe (ZETIA) 10 MG tablet [Pharmacy Med Name: EZETIMIBE 10 MG TABLET] 90 tablet 0    Sig: TAKE 1 TABLET (10 MG TOTAL) BY MOUTH DAILY. REPORTED ON 10/15/2015     Cardiovascular:  Antilipid - Sterol Transport Inhibitors Failed - 12/30/2022  2:32 AM      Failed - Lipid Panel in normal range within the last 12 months    Cholesterol, Total  Date Value Ref Range Status  06/09/2022 253 (H) 100 - 199 mg/dL Final   LDL Chol Calc (NIH)  Date Value Ref Range Status  06/09/2022 146 (H) 0 - 99 mg/dL Final   HDL  Date Value Ref Range Status  06/09/2022 55 >39 mg/dL Final   Triglycerides  Date Value Ref Range Status  06/09/2022 288 (H) 0 - 149 mg/dL Final         Passed - AST in normal range and within 360 days    AST  Date Value Ref Range Status  06/09/2022 16 0 - 40 IU/L Final   SGOT(AST)  Date Value Ref Range Status  10/02/2012 18 15 - 37 Unit/L Final         Passed - ALT in normal range and within 360 days    ALT  Date Value Ref Range Status  06/09/2022 12 0 - 32 IU/L Final   SGPT (ALT)  Date Value Ref Range Status  10/02/2012 22 12 - 78 U/L Final         Passed - Patient is not pregnant      Passed - Valid encounter within last 12 months    Recent Outpatient Visits           4 months ago Chest pain at rest   [facility] Primary Care & Sports Medicine at [facility]   6 months ago Essential hypertension   [place] Primary [facility] & Sports Medicine at [facility], MD   1 year ago Essential hypertension   [place] Primary Care & Sports Medicine at [facility], MD   1 year ago Essential hypertension   [place] Primary Care & Sports Medicine at [facility], [facility]   2 years ago Essential hypertension   [place] Primary Care & Sports Medicine at [facility], [facility]       Future Appointments              In 1 month Duanne Limerick, MD [facility] Primary Care & Sports Medicine at [facility], [facility]             metoprolol succinate (TOPROL-XL) 100 MG 24 hr tablet [Pharmacy Med Name: METOPROLOL SUCC ER 100 MG TAB] 90 tablet 1    Sig: TAKE WITH OR IMMEDIATELY FOLLOWING A MEAL.     Cardiovascular:  Beta Blockers Failed - 12/30/2022  2:32 AM      Failed - Last BP in normal range    BP Readings from Last 1 Encounters:  08/24/22 (!) 140/100         Passed - Last Heart Rate in normal range    Pulse Readings from Last 1 Encounters:  08/24/22 64         Passed - Valid encounter within last 6 months    Recent Outpatient Visits           4 months ago Chest pain at  rest   [facility] Primary Care & Sports Medicine at [facility]   6 months ago Essential hypertension   [place] Primary [facility] & Sports Medicine at [facility], [facility]   1 year ago Essential hypertension   [place] Primary Care & Sports Medicine at [facility], MD   1 year ago Essential hypertension   [place] Primary Care & Sports Medicine at [facility], MD   2 years ago Essential hypertension   [place] Primary Care & Sports Medicine at [facility], [facility]       Future Appointments             In 1 month Duanne Limerick, MD [facility] Primary Care & Sports Medicine at [facility], [facility]

## 2023-01-26 ENCOUNTER — Telehealth: Payer: Self-pay | Admitting: Family Medicine

## 2023-01-26 NOTE — Telephone Encounter (Signed)
Copied from CRM [id number]. Topic: Medicare AWV >> Jan 26, 2023  3:21 PM Payton Doughty wrote: Reason for CRM: LM 01/26/2023 to schedule AWV   Verlee Rossetti; Care Guide Ambulatory Clinical Support [place] [facility] Direct Dial: [phone]

## 2023-01-27 DIAGNOSIS — Z5181 Encounter for therapeutic drug level monitoring: Secondary | ICD-10-CM | POA: Diagnosis not present

## 2023-01-27 DIAGNOSIS — Z79891 Long term (current) use of opiate analgesic: Secondary | ICD-10-CM | POA: Diagnosis not present

## 2023-01-27 DIAGNOSIS — M542 Cervicalgia: Secondary | ICD-10-CM | POA: Diagnosis not present

## 2023-01-27 DIAGNOSIS — G894 Chronic pain syndrome: Secondary | ICD-10-CM | POA: Diagnosis not present

## 2023-01-27 DIAGNOSIS — M5459 Other low back pain: Secondary | ICD-10-CM | POA: Diagnosis not present

## 2023-02-02 ENCOUNTER — Ambulatory Visit: Payer: Medicare Other

## 2023-02-03 ENCOUNTER — Ambulatory Visit: Payer: Medicare Other | Admitting: Family Medicine

## 2023-02-03 DIAGNOSIS — Z5181 Encounter for therapeutic drug level monitoring: Secondary | ICD-10-CM | POA: Diagnosis not present

## 2023-02-03 DIAGNOSIS — M5459 Other low back pain: Secondary | ICD-10-CM | POA: Diagnosis not present

## 2023-02-03 DIAGNOSIS — M542 Cervicalgia: Secondary | ICD-10-CM | POA: Diagnosis not present

## 2023-02-03 DIAGNOSIS — Z79891 Long term (current) use of opiate analgesic: Secondary | ICD-10-CM | POA: Diagnosis not present

## 2023-02-03 DIAGNOSIS — G894 Chronic pain syndrome: Secondary | ICD-10-CM | POA: Diagnosis not present

## 2023-02-04 DIAGNOSIS — Z5181 Encounter for therapeutic drug level monitoring: Secondary | ICD-10-CM | POA: Diagnosis not present

## 2023-02-04 DIAGNOSIS — M5459 Other low back pain: Secondary | ICD-10-CM | POA: Diagnosis not present

## 2023-02-04 DIAGNOSIS — G894 Chronic pain syndrome: Secondary | ICD-10-CM | POA: Diagnosis not present

## 2023-02-04 DIAGNOSIS — Z79891 Long term (current) use of opiate analgesic: Secondary | ICD-10-CM | POA: Diagnosis not present

## 2023-02-08 ENCOUNTER — Ambulatory Visit: Payer: Medicare Other | Admitting: Family Medicine

## 2023-02-11 ENCOUNTER — Telehealth: Payer: Self-pay | Admitting: Family Medicine

## 2023-02-11 NOTE — Telephone Encounter (Signed)
Copied from CRM [id number]. Topic: Medicare AWV >> Feb 11, 2023  2:04 PM Payton Doughty wrote: Reason for CRM: Called 02/11/23 to sched AWV.Marland KitchenPlease confirm appt date for 03/07/33 at 9:30am. Same date as pcp appt. khc AWVS in office khc  Verlee Rossetti; Care Guide Ambulatory Clinical Support [place] [facility] Direct Dial: [phone]

## 2023-03-02 ENCOUNTER — Other Ambulatory Visit: Payer: Self-pay | Admitting: Family Medicine

## 2023-03-02 DIAGNOSIS — I10 Essential (primary) hypertension: Secondary | ICD-10-CM

## 2023-03-03 NOTE — Telephone Encounter (Signed)
Requested by interface surescripts. Courtesy refill. Future visit in 5 days.  Requested Prescriptions  Pending Prescriptions Disp Refills   losartan (COZAAR) 25 MG tablet [Pharmacy Med Name: LOSARTAN POTASSIUM 25 MG TAB] 90 tablet 0    Sig: TAKE 1 TABLET (25 MG TOTAL) BY MOUTH DAILY.     Cardiovascular:  Angiotensin Receptor Blockers Failed - 03/02/2023  2:28 AM      Failed - Cr in normal range and within 180 days    Creatinine  Date Value Ref Range Status  10/02/2012 1.05 0.60 - 1.30 mg/dL Final   Creatinine, Ser  Date Value Ref Range Status  06/09/2022 0.86 0.57 - 1.00 mg/dL Final         Failed - K in normal range and within 180 days    Potassium  Date Value Ref Range Status  06/09/2022 4.9 3.5 - 5.2 mmol/L Final  10/02/2012 3.9 3.5 - 5.1 mmol/L Final         Failed - Last BP in normal range    BP Readings from Last 1 Encounters:  08/24/22 (!) 140/100         Failed - Valid encounter within last 6 months    Recent Outpatient Visits           6 months ago Chest pain at rest   [facility] Primary Care & Sports Medicine at [facility], [facility]   8 months ago Essential hypertension   [place] Primary Care & Sports Medicine at [facility], MD   1 year ago Essential hypertension   [place] Primary Care & Sports Medicine at [facility], MD   2 years ago Essential hypertension   [place] Primary Care & Sports Medicine at [facility], MD   2 years ago Essential hypertension    Primary Care & Sports Medicine at [facility], [facility]       Future Appointments             In 5 days Duanne Limerick, MD [facility] Primary Care & Sports Medicine at [facility], [facility]            Passed - Patient is not pregnant

## 2023-03-08 ENCOUNTER — Telehealth: Payer: Self-pay

## 2023-03-08 ENCOUNTER — Ambulatory Visit: Payer: Medicare Other

## 2023-03-08 ENCOUNTER — Ambulatory Visit: Payer: Medicare Other | Admitting: Family Medicine

## 2023-03-08 NOTE — Telephone Encounter (Signed)
Called pt and left message asking if we are still her PCP- pt has cancelled or NO Showed her last 5 appts with [facility]. Need to see THIS WEEK if we are still her pcp

## 2023-03-11 ENCOUNTER — Other Ambulatory Visit: Payer: Self-pay | Admitting: Family Medicine

## 2023-03-11 DIAGNOSIS — F339 Major depressive disorder, recurrent, unspecified: Secondary | ICD-10-CM

## 2023-03-11 DIAGNOSIS — N3941 Urge incontinence: Secondary | ICD-10-CM

## 2023-03-11 NOTE — Telephone Encounter (Signed)
Requested Prescriptions  Pending Prescriptions Disp Refills   buPROPion (WELLBUTRIN XL) 150 MG 24 hr tablet [Pharmacy Med Name: BUPROPION HCL XL 150 MG TABLET] 90 tablet 0    Sig: TAKE 1 TABLET BY MOUTH EVERY DAY IN THE MORNING     Psychiatry: Antidepressants - bupropion Failed - 03/11/2023  2:43 AM      Failed - Last BP in normal range    BP Readings from Last 1 Encounters:  08/24/22 (!) 140/100         Failed - Valid encounter within last 6 months    Recent Outpatient Visits           6 months ago Chest pain at rest   [facility] Primary Care & Sports Medicine at [facility]   9 months ago Essential hypertension   [place] Primary Care & Sports Medicine at [facility] MD   1 year ago Essential hypertension   [place] Primary Care & Sports Medicine at [facility] MD   2 years ago Essential hypertension   [place] Primary Care & Sports Medicine at [facility], MD   2 years ago Essential hypertension   [place] Primary Care & Sports Medicine at [facility], MD              Passed - Cr in normal range and within 360 days    Creatinine  Date Value Ref Range Status  10/02/2012 1.05 0.60 - 1.30 mg/dL Final   Creatinine, Ser  Date Value Ref Range Status  06/09/2022 0.86 0.57 - 1.00 mg/dL Final         Passed - AST in normal range and within 360 days    AST  Date Value Ref Range Status  06/09/2022 16 0 - 40 IU/L Final   SGOT(AST)  Date Value Ref Range Status  10/02/2012 18 15 - 37 Unit/L Final         Passed - ALT in normal range and within 360 days    ALT  Date Value Ref Range Status  06/09/2022 12 0 - 32 IU/L Final   SGPT (ALT)  Date Value Ref Range Status  10/02/2012 22 12 - 78 U/L Final         Passed - Completed PHQ-2 or PHQ-9 in the last 360 days       oxybutynin (DITROPAN-XL) 5 MG 24 hr tablet [Pharmacy Med Name: OXYBUTYNIN CL ER 5  MG TABLET] 90 tablet 0    Sig: TAKE 1 TABLET BY MOUTH EVERYDAY AT BEDTIME     Urology:  Bladder Agents Passed - 03/11/2023  2:43 AM      Passed - Valid encounter within last 12 months    Recent Outpatient Visits           6 months ago Chest pain at rest   [facility] Primary Care & Sports Medicine at [facility]   9 months ago Essential hypertension   [place] Primary Care & Sports Medicine at [facility], MD   1 year ago Essential hypertension   [place] Primary Care & Sports Medicine at [facility], MD   2 years ago Essential hypertension   [place] Primary Care & Sports Medicine at [facility], MD   2 years ago Essential hypertension   [place] Primary Care &  Sports Medicine at [facility], MD

## 2023-03-14 ENCOUNTER — Other Ambulatory Visit: Payer: Self-pay | Admitting: Gastroenterology

## 2023-03-14 DIAGNOSIS — K219 Gastro-esophageal reflux disease without esophagitis: Secondary | ICD-10-CM

## 2023-03-15 ENCOUNTER — Telehealth: Payer: Self-pay | Admitting: Family Medicine

## 2023-03-15 NOTE — Telephone Encounter (Signed)
Copied from CRM [id number]. Topic: Medicare AWV >> Mar 15, 2023  9:08 AM Payton Doughty wrote: Reason for CRM:LM 03/15/2023 to schedule AWV   Verlee Rossetti; Care Guide Ambulatory Clinical Support [place] [facility] Direct Dial: [phone]

## 2023-03-17 DIAGNOSIS — Z5181 Encounter for therapeutic drug level monitoring: Secondary | ICD-10-CM | POA: Diagnosis not present

## 2023-03-17 DIAGNOSIS — Z79891 Long term (current) use of opiate analgesic: Secondary | ICD-10-CM | POA: Diagnosis not present

## 2023-03-17 DIAGNOSIS — G894 Chronic pain syndrome: Secondary | ICD-10-CM | POA: Diagnosis not present

## 2023-03-17 DIAGNOSIS — M542 Cervicalgia: Secondary | ICD-10-CM | POA: Diagnosis not present

## 2023-03-17 DIAGNOSIS — M5459 Other low back pain: Secondary | ICD-10-CM | POA: Diagnosis not present

## 2023-03-18 ENCOUNTER — Other Ambulatory Visit: Payer: Self-pay | Admitting: Family Medicine

## 2023-03-18 DIAGNOSIS — E559 Vitamin D deficiency, unspecified: Secondary | ICD-10-CM

## 2023-03-21 NOTE — Telephone Encounter (Signed)
Requested medications are due for refill today.  yes  Requested medications are on the active medications list.  yes  Last refill. 12/06/2022 #8 0 rf  Future visit scheduled.   no  Notes to clinic.  Refill not delegated.    Requested Prescriptions  Pending Prescriptions Disp Refills   Vitamin D, Ergocalciferol, (DRISDOL) 1.25 MG (50000 UNIT) CAPS capsule [Pharmacy Med Name: VITAMIN D2 1.25MG (50,000 UNIT)] 8 capsule 0    Sig: TAKE 1 CAPSULE EVERY 7 DAYS.     Endocrinology:  Vitamins - Vitamin D Supplementation 2 Failed - 03/18/2023  4:58 PM      Failed - Manual Review: Route requests for 50,000 IU strength to the provider      Failed - Vitamin D in normal range and within 360 days    No results found for: "ZO1096EA5", "WU9811BJ4", "VD125OH2TOT", "25OHVITD3", "25OHVITD2", "25OHVITD1", "VD25OH"       Passed - Ca in normal range and within 360 days    Calcium  Date Value Ref Range Status  06/09/2022 9.5 8.7 - 10.3 mg/dL Final   Calcium, Total  Date Value Ref Range Status  10/02/2012 9.6 8.5 - 10.1 mg/dL Final         Passed - Valid encounter within last 12 months    Recent Outpatient Visits           6 months ago Chest pain at rest   [facility] Primary Care & Sports Medicine at [facility], [facility]   9 months ago Essential hypertension   [place] Primary Care & Sports Medicine at [facility], MD   1 year ago Essential hypertension   [place] Primary Care & Sports Medicine at [facility], MD   2 years ago Essential hypertension   [place] Primary Care & Sports Medicine at [facility], MD   2 years ago Essential hypertension   [place] Primary Care & Sports Medicine at [facility], [facility]

## 2023-03-23 DIAGNOSIS — G894 Chronic pain syndrome: Secondary | ICD-10-CM | POA: Diagnosis not present

## 2023-03-23 DIAGNOSIS — M5459 Other low back pain: Secondary | ICD-10-CM | POA: Diagnosis not present

## 2023-03-23 DIAGNOSIS — Z5181 Encounter for therapeutic drug level monitoring: Secondary | ICD-10-CM | POA: Diagnosis not present

## 2023-03-23 DIAGNOSIS — Z79891 Long term (current) use of opiate analgesic: Secondary | ICD-10-CM | POA: Diagnosis not present

## 2023-04-14 ENCOUNTER — Telehealth: Payer: Self-pay | Admitting: Family Medicine

## 2023-04-14 DIAGNOSIS — M62838 Other muscle spasm: Secondary | ICD-10-CM | POA: Diagnosis not present

## 2023-04-14 DIAGNOSIS — M542 Cervicalgia: Secondary | ICD-10-CM | POA: Diagnosis not present

## 2023-04-14 DIAGNOSIS — M5459 Other low back pain: Secondary | ICD-10-CM | POA: Diagnosis not present

## 2023-04-14 DIAGNOSIS — Z79891 Long term (current) use of opiate analgesic: Secondary | ICD-10-CM | POA: Diagnosis not present

## 2023-04-14 DIAGNOSIS — G894 Chronic pain syndrome: Secondary | ICD-10-CM | POA: Diagnosis not present

## 2023-04-14 DIAGNOSIS — G629 Polyneuropathy, unspecified: Secondary | ICD-10-CM | POA: Diagnosis not present

## 2023-04-14 DIAGNOSIS — Z5181 Encounter for therapeutic drug level monitoring: Secondary | ICD-10-CM | POA: Diagnosis not present

## 2023-04-14 NOTE — Telephone Encounter (Signed)
Copied from CRM [id number]. Topic: Medicare AWV >> Apr 14, 2023  9:52 AM Payton Doughty wrote: Reason for CRM: Called LVM 04/14/2023 to schedule Annual Wellness Visit  Verlee Rossetti; Care Guide Ambulatory Clinical Support [place] [facility] Direct Dial: [phone]

## 2023-04-15 ENCOUNTER — Ambulatory Visit: Payer: Medicare Other | Admitting: Family Medicine

## 2023-04-15 ENCOUNTER — Other Ambulatory Visit: Payer: Self-pay | Admitting: Family Medicine

## 2023-04-15 DIAGNOSIS — I10 Essential (primary) hypertension: Secondary | ICD-10-CM

## 2023-04-15 DIAGNOSIS — N3941 Urge incontinence: Secondary | ICD-10-CM

## 2023-04-15 DIAGNOSIS — F339 Major depressive disorder, recurrent, unspecified: Secondary | ICD-10-CM

## 2023-04-15 DIAGNOSIS — F419 Anxiety disorder, unspecified: Secondary | ICD-10-CM

## 2023-04-15 DIAGNOSIS — E782 Mixed hyperlipidemia: Secondary | ICD-10-CM

## 2023-04-15 DIAGNOSIS — K219 Gastro-esophageal reflux disease without esophagitis: Secondary | ICD-10-CM

## 2023-04-15 NOTE — Telephone Encounter (Signed)
Medication Refill - Medication: losartan 25mg   /metropolol er 25mg  /hydroclorathyzide 25mg  /gabapetin 600mg /ezetimide 10mg /buspurone 30mg  /nexium 40mg /tyzanide 2mg /rosuvastin 10 mg/oxybutin 5mg /wellbutrin xl 150mg   Has the patient contacted their pharmacy? yes (Agent: If yes, when and what did the pharmacy advise?)pharmacy called directly in  Preferred Pharmacy (with phone number or street name):  [facility] - [place], [place] - [address] Phone: [phone]  Fax: [phone]     Has the patient been seen for an appointment in the last year OR does the patient have an upcoming appointment?   Agent: Please be advised that RX refills may take up to 3 business days. We ask that you follow-up with your pharmacy.

## 2023-04-15 NOTE — Telephone Encounter (Signed)
Requested medication (s) are due for refill today: routing for review  Requested medication (s) are on the active medication list: yes  Last refill:  multiple dates  Future visit scheduled: no  Notes to clinic:  Unable to refill per protocol, appointment needed.  Patient has several no shows, routing for approval     Requested Prescriptions  Pending Prescriptions Disp Refills   losartan (COZAAR) 25 MG tablet 90 tablet 0    Sig: Take 1 tablet (25 mg total) by mouth daily.     Cardiovascular:  Angiotensin Receptor Blockers Failed - 04/15/2023  1:50 PM      Failed - Cr in normal range and within 180 days    Creatinine  Date Value Ref Range Status  10/02/2012 1.05 0.60 - 1.30 mg/dL Final   Creatinine, Ser  Date Value Ref Range Status  06/09/2022 0.86 0.57 - 1.00 mg/dL Final         Failed - K in normal range and within 180 days    Potassium  Date Value Ref Range Status  06/09/2022 4.9 3.5 - 5.2 mmol/L Final  10/02/2012 3.9 3.5 - 5.1 mmol/L Final         Failed - Last BP in normal range    BP Readings from Last 1 Encounters:  08/24/22 (!) 140/100         Failed - Valid encounter within last 6 months    Recent Outpatient Visits           7 months ago Chest pain at rest   [facility]   10 months ago Essential hypertension   [place] [facility]   1 year ago Essential hypertension   [place] [facility], [facility]   2 years ago Essential hypertension   [place] [facility] [place]   2 years ago Essential hypertension   [place] [facility], [facility]              Passed - Patient is not pregnant       metoprolol succinate (TOPROL-XL) 100 MG 24 hr tablet 90 tablet 0    Sig:  Take with or immediately following a meal.     Cardiovascular:  Beta Blockers Failed - 04/15/2023  1:50 PM      Failed - Last BP in normal range    BP Readings from Last 1 Encounters:  08/24/22 (!) 140/100         Failed - Valid encounter within last 6 months    Recent Outpatient Visits           7 months ago Chest pain at rest   [facility]   10 months ago Essential hypertension   [place] [facility]   1 year ago Essential hypertension    [facility]   2 years ago Essential hypertension    [facility], [facility]   2 years ago Essential hypertension   [facility]  at [facility], MD              Passed - Last Heart Rate in normal range    Pulse Readings from Last 1 Encounters:  08/24/22 64          hydrochlorothiazide (HYDRODIURIL) 25 MG tablet      Sig: Take 1 tablet (25 mg total) by mouth daily.     Cardiovascular: Diuretics - Thiazide Failed - 04/15/2023  1:50 PM      Failed - Cr in normal range and within 180 days    Creatinine  Date Value Ref Range Status  10/02/2012 1.05 0.60 - 1.30 mg/dL Final   Creatinine, Ser  Date Value Ref Range Status  06/09/2022 0.86 0.57 - 1.00 mg/dL Final         Failed - K in normal range and within 180 days    Potassium  Date Value Ref Range Status  06/09/2022 4.9 3.5 - 5.2 mmol/L Final  10/02/2012 3.9 3.5 - 5.1 mmol/L Final         Failed - Na in normal range and within 180 days    Sodium  Date Value Ref Range Status  06/09/2022 137 134 - 144 mmol/L Final  10/02/2012 137 136 - 145 mmol/L Final         Failed - Last BP in normal range    BP Readings from Last 1 Encounters:  08/24/22 (!) 140/100          Failed - Valid encounter within last 6 months    Recent Outpatient Visits           7 months ago Chest pain at rest   [facility] [place]   10 months ago Essential hypertension   [place] [facility]   1 year ago Essential hypertension   [place] [facility], [facility]   2 years ago Essential hypertension   [place] [facility], MD   2 years ago Essential hypertension   [place] [facility] at [facility], MD               gabapentin (NEURONTIN) 600 MG tablet      Sig: Take 1 tablet (600 mg total) by mouth 2 (two) times daily. Dr Metta Clines     Neurology: Anticonvulsants - gabapentin Passed - 04/15/2023  1:50 PM      Passed - Cr in normal range and within 360 days    Creatinine  Date Value Ref Range Status  10/02/2012 1.05 0.60 - 1.30 mg/dL Final   Creatinine, Ser  Date Value Ref Range Status  06/09/2022 0.86 0.57 - 1.00 mg/dL Final         Passed - Completed PHQ-2 or PHQ-9 in the last 360 days      Passed - Valid encounter within last 12 months    Recent Outpatient Visits           7 months ago Chest pain at rest   [facility] [place]   10 months ago Essential hypertension   [place] [facility] [place]   1 year  ago Essential hypertension   [place] [facility] [place]   2 years ago Essential hypertension   [place] [facility], [place]   2 years ago Essential hypertension   [place] [facility] at [facility], MD                ezetimibe (ZETIA) 10 MG tablet 90 tablet 0    Sig: Take 1 tablet (10 mg total) by mouth daily. Reported on 10/15/2015     Cardiovascular:  Antilipid - Sterol Transport Inhibitors Failed - 04/15/2023  1:50 PM      Failed - Lipid Panel in normal range within the last 12 months    Cholesterol, Total  Date Value Ref Range Status  06/09/2022 253 (H) 100 - 199 mg/dL Final   LDL Chol Calc (NIH)  Date Value Ref Range Status  06/09/2022 146 (H) 0 - 99 mg/dL Final   HDL  Date Value Ref Range Status  06/09/2022 55 >39 mg/dL Final   Triglycerides  Date Value Ref Range Status  06/09/2022 288 (H) 0 - 149 mg/dL Final         Passed - AST in normal range and within 360 days    AST  Date Value Ref Range Status  06/09/2022 16 0 - 40 IU/L Final   SGOT(AST)  Date Value Ref Range Status  10/02/2012 18 15 - 37 Unit/L Final         Passed - ALT in normal range and within 360 days    ALT  Date Value Ref Range Status  06/09/2022 12 0 - 32 IU/L Final   SGPT (ALT)  Date Value Ref Range Status  10/02/2012 22 12 - 78 U/L Final         Passed - Patient is not pregnant      Passed - Valid encounter within last 12 months    Recent Outpatient Visits           7 months ago Chest pain at rest   [facility] [place]   10 months ago Essential hypertension   [place] [facility]   1 year ago Essential hypertension   [place] [facility] [place]   2 years ago Essential hypertension   [place] [facility], [facility]   2 years ago Essential hypertension   [place] [facility] at [facility], MD               busPIRone (BUSPAR) 30 MG tablet 180 tablet 1    Sig: Take 1 tablet (30 mg total) by mouth 2 (two) times daily.      Psychiatry: Anxiolytics/Hypnotics - Non-controlled Passed - 04/15/2023  1:50 PM      Passed - Valid encounter within last 12 months    Recent Outpatient Visits           7 months ago Chest pain at rest   [facility] [place]   10 months ago Essential hypertension    [facility] at [facility]   1 year ago Essential hypertension   [place] [facility] at [facility], MD   2 years ago Essential hypertension   [place] [facility] [place]   2 years ago Essential hypertension   [place] [facility] at [facility], MD               esomeprazole (NEXIUM) 40 MG capsule 180 capsule 0    Sig: Take 1 capsule (40 mg total) by mouth 2 (two) times daily before a meal. MUST SCHEDULE OFFICE VISIT     Gastroenterology: Proton Pump Inhibitors 2 Passed - 04/15/2023  1:50 PM      Passed - ALT in normal range and within 360 days    ALT  Date Value Ref Range Status  06/09/2022 12 0 - 32 IU/L Final   SGPT (ALT)  Date Value Ref Range Status  10/02/2012 22 12 - 78 U/L Final         Passed - AST in normal range and within 360 days    AST  Date Value Ref Range Status  06/09/2022 16 0 - 40 IU/L Final   SGOT(AST)  Date Value Ref Range Status  10/02/2012 18 15 - 37 Unit/L Final         Passed - Valid encounter within last 12 months    Recent Outpatient Visits           7 months ago Chest pain at rest   [facility] [place]   10 months ago Essential hypertension   [place] [facility] [place]   1 year ago Essential hypertension   [place] [facility] [place]   2 years  ago Essential hypertension   [place] [facility] [place]   2 years ago Essential hypertension   [place] [facility] Medicine at [facility], MD               tiZANidine (ZANAFLEX) 2 MG tablet 110 tablet 2    Sig: Limit  2-4 tablets by mouth per day tolerated     Not Delegated - Cardiovascular:  Alpha-2 Agonists - tizanidine Failed - 04/15/2023  1:50 PM      Failed - This refill cannot be delegated      Failed - Valid encounter within last 6 months    Recent Outpatient Visits           7 months ago Chest pain at rest   [facility] [place]   10 months ago Essential hypertension   [place] [facility] [place]   1 year ago Essential hypertension   [place] [facility] [place]   2 years ago Essential hypertension   [place] [facility] [place]   2 years ago Essential hypertension   [place] [facility], [place]  rosuvastatin (CRESTOR) 10 MG tablet      Sig: Take 1 tablet (10 mg total) by mouth daily.     Cardiovascular:  Antilipid - Statins 2 Failed - 04/15/2023  1:50 PM      Failed - Lipid Panel in normal range within the last 12 months    Cholesterol, Total  Date Value Ref Range Status  06/09/2022 253 (H) 100 - 199 mg/dL Final   LDL Chol Calc (NIH)  Date Value Ref Range Status  06/09/2022 146 (H) 0 - 99 mg/dL Final   HDL  Date Value Ref Range Status  06/09/2022 55 >39 mg/dL Final   Triglycerides  Date Value Ref Range Status  06/09/2022 288 (H) 0 - 149 mg/dL Final         Passed - Cr in normal range and within 360 days    Creatinine  Date Value Ref Range Status  10/02/2012 1.05  0.60 - 1.30 mg/dL Final   Creatinine, Ser  Date Value Ref Range Status  06/09/2022 0.86 0.57 - 1.00 mg/dL Final         Passed - Patient is not pregnant      Passed - Valid encounter within last 12 months    Recent Outpatient Visits           7 months ago Chest pain at rest   [facility], [place]   10 months ago Essential hypertension   [place] [facility], MD   1 year ago Essential hypertension   [place] [facility], [place]   2 years ago Essential hypertension   [place] [facility], MD   2 years ago Essential hypertension   [place] [facility] at [facility], MD               oxybutynin (DITROPAN-XL) 5 MG 24 hr tablet 90 tablet 0    Sig: TAKE 1 TABLET BY MOUTH EVERYDAY AT BEDTIME     Urology:  Bladder Agents Passed - 04/15/2023  1:50 PM      Passed - Valid encounter within last 12 months    Recent Outpatient Visits           7 months ago Chest pain at rest   [facility] [place]   10 months ago Essential hypertension   [place] [facility], MD   1 year ago Essential hypertension   [place] [facility], MD   2 years ago Essential hypertension   [place] [facility], MD   2 years ago Essential hypertension   [place] [facility] Medicine at [facility], MD               buPROPion (WELLBUTRIN XL) 150 MG 24 hr tablet 90 tablet 0    Sig: TAKE 1 TABLET BY MOUTH EVERY DAY IN THE MORNING     Psychiatry: Antidepressants -  bupropion Failed - 04/15/2023  1:50 PM      Failed - Last BP in normal range  BP Readings from Last 1 Encounters:  08/24/22 (!) 140/100         Failed - Valid encounter within last 6 months    Recent Outpatient Visits           7 months ago Chest pain at rest   [facility] [place]   10 months ago Essential hypertension   [place] [facility], MD   1 year ago Essential hypertension   [place] [facility], [place]   2 years ago Essential hypertension   [place] [facility], MD   2 years ago Essential hypertension   [place] [facility] at [facility], MD              Passed - Cr in normal range and within 360 days    Creatinine  Date Value Ref Range Status  10/02/2012 1.05 0.60 - 1.30 mg/dL Final   Creatinine, Ser  Date Value Ref Range Status  06/09/2022 0.86 0.57 - 1.00 mg/dL Final         Passed - AST in normal range and within 360 days    AST  Date Value Ref Range Status  06/09/2022 16 0 - 40 IU/L Final   SGOT(AST)  Date Value Ref Range Status  10/02/2012 18 15 - 37 Unit/L Final         Passed - ALT in normal range and within 360 days    ALT  Date Value Ref Range Status  06/09/2022 12 0 - 32 IU/L Final   SGPT (ALT)  Date Value Ref Range Status  10/02/2012 22 12 - 78 U/L Final         Passed - Completed PHQ-2 or PHQ-9 in the last 360 days

## 2023-04-21 ENCOUNTER — Other Ambulatory Visit: Payer: Self-pay | Admitting: Family Medicine

## 2023-04-21 DIAGNOSIS — E559 Vitamin D deficiency, unspecified: Secondary | ICD-10-CM

## 2023-04-21 NOTE — Telephone Encounter (Signed)
Requested medication (s) are due for refill today: Yes  Requested medication (s) are on the active medication list: Yes  Last refill:  12/06/22  Future visit scheduled: Yes  Notes to clinic:  Manual review needed     Requested Prescriptions  Pending Prescriptions Disp Refills   Vitamin D, Ergocalciferol, (DRISDOL) 1.25 MG (50000 UNIT) CAPS capsule [Pharmacy Med Name: VITAMIN D2 1.25MG (50,000 UNIT)] 8 capsule 0    Sig: TAKE 1 CAPSULE EVERY 7 DAYS.     Endocrinology:  Vitamins - Vitamin D Supplementation 2 Failed - 04/21/2023 10:15 AM      Failed - Manual Review: Route requests for 50,000 IU strength to the provider      Failed - Vitamin D in normal range and within 360 days    No results found for: "[id number]", "QM5784ON6", "VD125OH2TOT", "25OHVITD3", "25OHVITD2", "25OHVITD1", "VD25OH"       Passed - Ca in normal range and within 360 days    Calcium  Date Value Ref Range Status  06/09/2022 9.5 8.7 - 10.3 mg/dL Final   Calcium, Total  Date Value Ref Range Status  10/02/2012 9.6 8.5 - 10.1 mg/dL Final         Passed - Valid encounter within last 12 months    Recent Outpatient Visits           8 months ago Chest pain at rest   [facility] [place]   10 months ago Essential hypertension   [place] [facility] MD   1 year ago Essential hypertension   [place] [facility], MD   2 years ago Essential hypertension   [place] [facility], MD   2 years ago Essential hypertension   [place] [facility], [place]       Future Appointments             In 2 weeks Duanne Limerick, MD [facility] Primary Care & [facility] Medicine at [facility], [facility]

## 2023-04-28 DIAGNOSIS — M5412 Radiculopathy, cervical region: Secondary | ICD-10-CM | POA: Diagnosis not present

## 2023-04-28 DIAGNOSIS — M542 Cervicalgia: Secondary | ICD-10-CM | POA: Diagnosis not present

## 2023-05-01 ENCOUNTER — Emergency Department: Payer: Medicare Other

## 2023-05-01 ENCOUNTER — Emergency Department
Admission: EM | Admit: 2023-05-01 | Discharge: 2023-05-01 | Disposition: A | Discharge status: Home / Self Care | Payer: Medicare Other | Attending: Emergency Medicine | Admitting: Emergency Medicine

## 2023-05-01 ENCOUNTER — Other Ambulatory Visit: Payer: Self-pay

## 2023-05-01 DIAGNOSIS — I10 Essential (primary) hypertension: Secondary | ICD-10-CM | POA: Diagnosis not present

## 2023-05-01 DIAGNOSIS — R0602 Shortness of breath: Secondary | ICD-10-CM | POA: Diagnosis not present

## 2023-05-01 DIAGNOSIS — R519 Headache, unspecified: Secondary | ICD-10-CM | POA: Diagnosis not present

## 2023-05-01 LAB — CBC
HCT: 38.5 % (ref 36.0–46.0)
Hemoglobin: 12.7 g/dL (ref 12.0–15.0)
MCH: 30.2 pg (ref 26.0–34.0)
MCHC: 33 g/dL (ref 30.0–36.0)
MCV: 91.4 fL (ref 80.0–100.0)
Platelets: 403 10*3/uL — ABNORMAL HIGH (ref 150–400)
RBC: 4.21 MIL/uL (ref 3.87–5.11)
RDW: 12.6 % (ref 11.5–15.5)
WBC: 5.5 10*3/uL (ref 4.0–10.5)
nRBC: 0 % (ref 0.0–0.2)

## 2023-05-01 LAB — BASIC METABOLIC PANEL
Anion gap: 9 (ref 5–15)
BUN: 11 mg/dL (ref 8–23)
CO2: 26 mmol/L (ref 22–32)
Calcium: 9.4 mg/dL (ref 8.9–10.3)
Chloride: 103 mmol/L (ref 98–111)
Creatinine, Ser: 0.94 mg/dL (ref 0.44–1.00)
GFR, Estimated: >60 mL/min (ref >60)
Glucose, Bld: 109 mg/dL — ABNORMAL HIGH (ref 70–99)
Potassium: 4.2 mmol/L (ref 3.5–5.1)
Sodium: 138 mmol/L (ref 135–145)

## 2023-05-01 LAB — TROPONIN I (HIGH SENSITIVITY)
Troponin I (High Sensitivity): 10 ng/L (ref <18)
Troponin I (High Sensitivity): 15 ng/L (ref <18)

## 2023-05-01 MED ORDER — PROCHLORPERAZINE EDISYLATE 10 MG/2ML IJ SOLN
10.0000 mg | Freq: Once | INTRAMUSCULAR | Status: AC
  Administered 2023-05-01: 10 mg via INTRAVENOUS
  Filled 2023-05-01: qty 2

## 2023-05-01 MED ORDER — DIPHENHYDRAMINE HCL 50 MG/ML IJ SOLN
25.0000 mg | Freq: Once | INTRAMUSCULAR | Status: AC
  Administered 2023-05-01: 25 mg via INTRAVENOUS
  Filled 2023-05-01: qty 1

## 2023-05-01 NOTE — ED Notes (Signed)
EDP and CN into room. Preparing pt for d/c plan/ expectations.

## 2023-05-01 NOTE — ED Provider Notes (Addendum)
[facility] Provider Note    Event Date/Time   First MD Initiated Contact with Patient 05/01/23 1456     (approximate)   History   Hypertension   HPI  Jamie Kim is a 62 y.o. female who comes in with concern for headache, chest tightness, shortness of breath.  Patient reports that she is under a lot of stress at this time.  Her son is currently in the emergency room under psychiatric hold that she had to file IVC paperwork.  She reports being very stressed out about the care that patient has been receiving and mostly focuses on this during our discussion.  I explained to her that my job here is to help figure out if there is anything more serious going on with her and that I am not one of the doctors she was taking care of her son and we were able to discuss more of her symptoms afterwards.    She does report that she has had chronic headaches for a year but she is following up with a neurosurgeon to get MRI of her neck and given this chronic neck pain leading to headaches but she states that today's headache was more severe due to her elevated blood pressure.  She also reports having felt like her heart rate was over 70 and that was making her feel like she was short of breath but now upon my evaluation her heart rate has come down and she reports feeling better.  She reports that she stopped taking her hydrochlorothiazide about a week ago due to it causing some orthostatic hypotension.  She is currently on metoprolol 25 mg and losartan.  She does report that she does see a pain doctor and is on chronic pain medications.   I reviewed patient's cardiology note from [facility] where patient is on metoprolol 25 mg but at that time she is having episodes of orthostatic hypotension so her hydrochlorothiazide was decreased to 12.5 mg   Physical Exam   Triage Vital Signs: ED Triage Vitals  Encounter Vitals Group     BP 05/01/23 1424 (!) 224/137     Systolic BP  Percentile --      Diastolic BP Percentile --      Pulse Rate 05/01/23 1424 (!) 102     Resp 05/01/23 1424 20     Temp 05/01/23 1424 (!) 97.5 F (36.4 C)     Temp Source 05/01/23 1424 Oral     SpO2 05/01/23 1424 100 %     Weight 05/01/23 1425 192 lb (87.1 kg)     Height 05/01/23 1425 5\' 10"  (1.778 m)     Head Circumference --      Peak Flow --      Pain Score 05/01/23 1424 5     Pain Loc --      Pain Education --      Exclude from Growth Chart --     Most recent vital signs: Vitals:   05/01/23 1444 05/01/23 1445  BP:  (!) 215/115  Pulse:  76  Resp: 17 10  Temp:    SpO2:       General: Awake, no distress.  CV:  Good peripheral perfusion.  Resp:  Normal effort.  Abd:  No distention.  Soft and nontender Other:  Cranial nerves appear intact equal strength in arms and legs sensation intact. No swelling in legs no calf tenderness   ED Results / Procedures / Treatments   Labs (  all labs ordered are listed, but only abnormal results are displayed) Labs Reviewed  BASIC METABOLIC PANEL - Abnormal; Notable for the following components:      Result Value   Glucose, Bld 109 (*)    All other components within normal limits  CBC - Abnormal; Notable for the following components:   Platelets 403 (*)    All other components within normal limits  TROPONIN I (HIGH SENSITIVITY)     EKG  My interpretation of EKG:  Normal sinus rate of 83 without any ST elevation or T wave inversions Q waves in the inferior lateral leads, normal intervals.  I reviewed her prior EKG from 2014 where she had similar Q waves at that time.  RADIOLOGY I have reviewed the xray personally and interpreted and no evidence of any pneumonia or edema.   PROCEDURES:  Critical Care performed: No  .1-3 Lead EKG Interpretation  Performed by: Concha Se, MD Authorized by: Concha Se, MD     Interpretation: normal     ECG rate:  60   ECG rate assessment: normal     Rhythm: sinus rhythm      Ectopy: none     Conduction: normal      MEDICATIONS ORDERED IN ED: Medications  prochlorperazine (COMPAZINE) injection 10 mg (10 mg Intravenous Given 05/01/23 1542)  diphenhydrAMINE (BENADRYL) injection 25 mg (25 mg Intravenous Given 05/01/23 1542)     IMPRESSION / MDM / ASSESSMENT AND PLAN / ED COURSE  I reviewed the triage vital signs and the nursing notes.   Patient's presentation is most consistent with acute presentation with potential threat to life or bodily function.   Patient comes in with hypertension but it seems like this is most likely secondary to issues with her son currently being hospitalized under psychiatric evaluation she had a does report a lot of stress and being very upset about the current situation.  I am hesitant to give her any medications that could lower her blood pressure given she reports issues with her orthostatic hypotension.  We discussed different options and she agrees with holding off on blood pressure management and just ensure that there is no signs of any endorgan damage and to treat her headache with some Compazine, Benadryl.  She already has follow-up with cardiology in 2 to 3 weeks and she have her blood pressure rechecked and if it still running elevated when she is not in the stressful situation then they can elect to add additional blood pressure medications.  She expressed understanding and felt comfortable with this plan. No signs of DVT on exam, doubt PE based on story. Doubt dissection given chest pain is resolving no numbness.   BMP is reassuring.  CBC shows slightly elevated platelets but she has had this previously initial troponin is negative  Report trop was slightly upright from 10-15 which I suspect is more likely demand from the hypertension.  Her blood pressures come down without any interventions and she seems much more calm and her headache is been treated and she feels a lot better.  We discussed her slightly abnormal EKG with some  Q waves in it but she reports that she had a stress test done a few months ago that was normal and she has had these Q waves 10 years ago on review of my record.  Discussed following up with cardiology to discuss this with them and she expressed understanding.  This time given she is chest pain-free she is requesting discharge  home.   The patient is on the cardiac monitor to evaluate for evidence of arrhythmia and/or significant heart rate changes.      FINAL CLINICAL IMPRESSION(S) / ED DIAGNOSES   Final diagnoses:  Hypertension, unspecified type     Rx / DC Orders   ED Discharge Orders     None        Note:  This document was prepared using Dragon voice recognition software and may include unintentional dictation errors.   Concha Se, MD 05/01/23 1724    Concha Se, MD 05/01/23 [phone]

## 2023-05-01 NOTE — ED Notes (Signed)
Pt alert, NAD, calm, interactive, resps e/u, steady gait into room from triage.

## 2023-05-01 NOTE — Discharge Instructions (Signed)
Please follow-up with your cardiologist to discuss your slightly abnormal EKG although it does look similar to what you are 10 years ago if you develop return of chest pain please return to the ER for repeat evaluation as I cannot predict future heart attacks.  However today there was no signs of a heart attack and I suspect her blood pressure was elevated from the stress as we discussed we can hold off on adjusting her blood pressure medicine at this time.  Return to the ER for worsening pain, fevers or any other concerns

## 2023-05-01 NOTE — ED Notes (Signed)
Pt endorses HA, chest tightness, and sob. Denies nausea, visual changes, syncope, fatigue or dizziness.

## 2023-05-01 NOTE — ED Notes (Signed)
EDP at [facility]. Pt to CT via stretcher.

## 2023-05-01 NOTE — ED Notes (Signed)
Back from radiology. Alert, NAD, calm.

## 2023-05-01 NOTE — ED Notes (Signed)
Not in room. Pt in imaging.

## 2023-05-01 NOTE — ED Notes (Signed)
Back from CT

## 2023-05-01 NOTE — ED Notes (Signed)
Up to b/r, steady gait, denies dizziness.

## 2023-05-01 NOTE — ED Triage Notes (Signed)
Pt from [facility] with high blood pressure. Pt states it was a systolic in the 200s. Pt denies weakness, but reports a headache and shortness of breath. Pt also reports chest tightness.   Pt states taking BP meds at 7am.

## 2023-05-05 DIAGNOSIS — Z5181 Encounter for therapeutic drug level monitoring: Secondary | ICD-10-CM | POA: Diagnosis not present

## 2023-05-05 DIAGNOSIS — G894 Chronic pain syndrome: Secondary | ICD-10-CM | POA: Diagnosis not present

## 2023-05-05 DIAGNOSIS — Z79891 Long term (current) use of opiate analgesic: Secondary | ICD-10-CM | POA: Diagnosis not present

## 2023-05-05 DIAGNOSIS — M542 Cervicalgia: Secondary | ICD-10-CM | POA: Diagnosis not present

## 2023-05-05 DIAGNOSIS — M5459 Other low back pain: Secondary | ICD-10-CM | POA: Diagnosis not present

## 2023-05-10 ENCOUNTER — Ambulatory Visit: Payer: Medicare Other | Admitting: Family Medicine

## 2023-05-27 ENCOUNTER — Telehealth: Payer: Self-pay | Admitting: Family Medicine

## 2023-05-27 ENCOUNTER — Ambulatory Visit: Payer: Medicare Other | Admitting: Family Medicine

## 2023-05-27 NOTE — Telephone Encounter (Signed)
Spoke with grandson that patient missed appointment today he will relay the message to follow up.

## 2023-05-31 DIAGNOSIS — Z5181 Encounter for therapeutic drug level monitoring: Secondary | ICD-10-CM | POA: Diagnosis not present

## 2023-05-31 DIAGNOSIS — M542 Cervicalgia: Secondary | ICD-10-CM | POA: Diagnosis not present

## 2023-05-31 DIAGNOSIS — Z79891 Long term (current) use of opiate analgesic: Secondary | ICD-10-CM | POA: Diagnosis not present

## 2023-05-31 DIAGNOSIS — M5412 Radiculopathy, cervical region: Secondary | ICD-10-CM | POA: Diagnosis not present

## 2023-05-31 DIAGNOSIS — M5459 Other low back pain: Secondary | ICD-10-CM | POA: Diagnosis not present

## 2023-05-31 DIAGNOSIS — G894 Chronic pain syndrome: Secondary | ICD-10-CM | POA: Diagnosis not present

## 2023-06-10 DIAGNOSIS — M5459 Other low back pain: Secondary | ICD-10-CM | POA: Diagnosis not present

## 2023-06-10 DIAGNOSIS — Z79891 Long term (current) use of opiate analgesic: Secondary | ICD-10-CM | POA: Diagnosis not present

## 2023-06-10 DIAGNOSIS — G894 Chronic pain syndrome: Secondary | ICD-10-CM | POA: Diagnosis not present

## 2023-06-10 DIAGNOSIS — Z5181 Encounter for therapeutic drug level monitoring: Secondary | ICD-10-CM | POA: Diagnosis not present

## 2023-06-13 ENCOUNTER — Other Ambulatory Visit: Payer: Self-pay | Admitting: Family Medicine

## 2023-06-13 ENCOUNTER — Other Ambulatory Visit: Payer: Self-pay | Admitting: Gastroenterology

## 2023-06-13 DIAGNOSIS — K219 Gastro-esophageal reflux disease without esophagitis: Secondary | ICD-10-CM

## 2023-06-13 DIAGNOSIS — N3941 Urge incontinence: Secondary | ICD-10-CM

## 2023-06-17 ENCOUNTER — Other Ambulatory Visit: Payer: Self-pay | Admitting: Family Medicine

## 2023-06-17 DIAGNOSIS — F339 Major depressive disorder, recurrent, unspecified: Secondary | ICD-10-CM

## 2023-06-27 ENCOUNTER — Ambulatory Visit: Payer: Medicare Other | Admitting: Family Medicine

## 2023-06-27 ENCOUNTER — Other Ambulatory Visit: Payer: Self-pay | Admitting: Family Medicine

## 2023-06-27 VITALS — BP 130/80 | HR 65 | Ht 70.0 in | Wt 187.0 lb

## 2023-06-27 DIAGNOSIS — F419 Anxiety disorder, unspecified: Secondary | ICD-10-CM

## 2023-06-27 DIAGNOSIS — E782 Mixed hyperlipidemia: Secondary | ICD-10-CM

## 2023-06-27 DIAGNOSIS — F339 Major depressive disorder, recurrent, unspecified: Secondary | ICD-10-CM

## 2023-06-27 DIAGNOSIS — I10 Essential (primary) hypertension: Secondary | ICD-10-CM

## 2023-06-27 MED ORDER — METOPROLOL SUCCINATE ER 25 MG PO TB24: 25.0000 mg | ORAL_TABLET | Freq: Every day | ORAL | 0 refills | Status: DC

## 2023-06-27 MED ORDER — EZETIMIBE 10 MG PO TABS: 10.0000 mg | ORAL_TABLET | Freq: Every day | ORAL | 0 refills | Status: DC

## 2023-06-27 MED ORDER — LOSARTAN POTASSIUM 25 MG PO TABS: 25.0000 mg | ORAL_TABLET | Freq: Every day | ORAL | 0 refills | Status: DC

## 2023-06-27 MED ORDER — BUPROPION HCL ER (XL) 150 MG PO TB24: ORAL_TABLET | ORAL | 0 refills | Status: DC

## 2023-06-27 MED ORDER — BUSPIRONE HCL 30 MG PO TABS: 30.0000 mg | ORAL_TABLET | Freq: Two times a day (BID) | ORAL | 0 refills | Status: DC

## 2023-06-27 MED ORDER — ROSUVASTATIN CALCIUM 10 MG PO TABS: 10.0000 mg | ORAL_TABLET | Freq: Every day | ORAL | 0 refills | Status: DC

## 2023-06-27 NOTE — Progress Notes (Signed)
Date:  06/27/2023   Name:  Jamie Kim   DOB:  Sep 19, 1960   MRN:  [id number]   Chief Complaint: Hypertension, Hyperlipidemia, and Depression  Hypertension This is a chronic problem. The current episode started more than 1 year ago. The problem has been gradually improving since onset. The problem is controlled. Associated symptoms include headaches. Pertinent negatives include no anxiety, blurred vision, chest pain, malaise/fatigue, neck pain, orthopnea, palpitations, peripheral edema, PND, shortness of breath or sweats. There are no associated agents to hypertension. Risk factors for coronary artery disease include dyslipidemia. Past treatments include angiotensin blockers and beta blockers. There are no compliance problems.  There is no history of chronic renal disease.  Hyperlipidemia This is a chronic problem. The current episode started more than 1 year ago. The problem is controlled. Recent lipid tests were reviewed and are normal. She has no history of chronic renal disease, diabetes, hypothyroidism, liver disease, obesity or nephrotic syndrome. There are no known factors aggravating her hyperlipidemia. Pertinent negatives include no chest pain or shortness of breath. Current antihyperlipidemic treatment includes statins and ezetimibe. The current treatment provides moderate improvement of lipids. Risk factors for coronary artery disease include dyslipidemia.  Depression        The problem has been gradually improving since onset.  Associated symptoms include decreased concentration, irritable, decreased interest, headaches and sad.  Associated symptoms include no suicidal ideas.  Past treatments include other medications.   Pertinent negatives include no hypothyroidism and no anxiety.   Lab Results  Component Value Date   NA 138 05/01/2023   K 4.2 05/01/2023   CO2 26 05/01/2023   GLUCOSE 109 (H) 05/01/2023   BUN 11 05/01/2023   CREATININE 0.94 05/01/2023   CALCIUM 9.4  05/01/2023   EGFR 77 06/09/2022   GFRNONAA >60 05/01/2023   Lab Results  Component Value Date   CHOL 253 (H) 06/09/2022   HDL 55 06/09/2022   LDLCALC 146 (H) 06/09/2022   TRIG 288 (H) 06/09/2022   CHOLHDL 2.5 11/02/2018   Lab Results  Component Value Date   TSH 1.690 12/07/2019   No results found for: "HGBA1C" Lab Results  Component Value Date   WBC 5.5 05/01/2023   HGB 12.7 05/01/2023   HCT 38.5 05/01/2023   MCV 91.4 05/01/2023   PLT 403 (H) 05/01/2023   Lab Results  Component Value Date   ALT 12 06/09/2022   AST 16 06/09/2022   ALKPHOS 72 06/09/2022   BILITOT <0.2 06/09/2022   No results found for: "25OHVITD2", "25OHVITD3", "VD25OH"   Review of Systems  Constitutional:  Negative for malaise/fatigue and unexpected weight change.  Eyes:  Negative for blurred vision and visual disturbance.  Respiratory:  Negative for choking, chest tightness, shortness of breath and wheezing.   Cardiovascular:  Negative for chest pain, palpitations, orthopnea and PND.  Gastrointestinal:  Negative for abdominal distention.  Musculoskeletal:  Negative for neck pain.  Neurological:  Positive for headaches.  Psychiatric/Behavioral:  Positive for decreased concentration. Negative for suicidal ideas.     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   Family history of malignant neoplasm of gastrointestinal tract    Benign neoplasm of ascending colon    Benign neoplasm of transverse colon    Polyp of sigmoid colon    Gastric polyp  Status post Nissen fundoplication (without gastrostomy tube) procedure    Esophageal dysphagia    Heartburn    Essential hypertension 09/03/2016   Periodic headache syndrome, not intractable 09/03/2016   Gastroesophageal reflux disease 09/03/2016   Multiple thyroid nodules 09/03/2016    Vitamin D deficiency 09/03/2016   Chronic anxiety 09/03/2016   Depression, recurrent (HCC) 09/03/2016   DDD (degenerative disc disease), lumbar 02/26/2015   Bilateral occipital neuralgia 01/27/2015   Spinal stenosis of lumbar region 11/25/2014   DDD (degenerative disc disease), cervical 11/24/2014   Status post cervical spinal fusion 11/24/2014   Lumbosacral facet joint syndrome 11/24/2014   Sacroiliac joint dysfunction of both sides 11/24/2014   Greater trochanteric bursitis of both hips 11/24/2014   Cervical facet syndrome 11/24/2014   Hyperlipidemia    Chest pain 10/26/2012   Palpitations 10/26/2012    Allergies  Allergen Reactions   Codeine Itching   Hydrocodone Itching   Morphine And Codeine    Oxycodone Itching   Morphine Itching   Oxybutynin Itching   Tramadol-Acetaminophen Itching    Past Surgical History:  Procedure Laterality Date   ANTERIOR CERVICAL DECOMP/DISCECTOMY FUSION     BLADDER SUSPENSION     CARDIAC CATHETERIZATION  11/23/12   [facility];EF 65%   CARPAL TUNNEL RELEASE Bilateral    CHOLECYSTECTOMY     COLONOSCOPY WITH PROPOFOL N/A 06/13/2018   Procedure: COLONOSCOPY WITH BIOPSIES;  Surgeon: Pasty Spillers, MD;  Location: [facility];  Service: Endoscopy;  Laterality: N/A;   COLONOSCOPY WITH PROPOFOL N/A 08/02/2022   Procedure: COLONOSCOPY WITH PROPOFOL with polypectomy;  Surgeon: Midge Minium, MD;  Location: [facility];  Service: Endoscopy;  Laterality: N/A;   ESOPHAGOGASTRODUODENOSCOPY (EGD) WITH PROPOFOL N/A 06/13/2018   Procedure: ESOPHAGOGASTRODUODENOSCOPY (EGD) WITH BIOPSIES;  Surgeon: Pasty Spillers, MD;  Location: [facility];  Service: Endoscopy;  Laterality: N/A;   ESOPHAGOGASTRODUODENOSCOPY (EGD) WITH PROPOFOL N/A 08/02/2022   Procedure: ESOPHAGOGASTRODUODENOSCOPY (EGD) WITH PROPOFOL;  Surgeon: Midge Minium, MD;  Location: [facility];  Service: Endoscopy;  Laterality: N/A;  sleep apnea   HERNIA REPAIR      KNEE SURGERY Left    LAPAROSCOPY     NECK SURGERY     NISSEN FUNDOPLICATION     POLYPECTOMY N/A 06/13/2018   Procedure: POLYPECTOMY;  Surgeon: Pasty Spillers, MD;  Location: [facility];  Service: Endoscopy;  Laterality: N/A;   TONSILLECTOMY     TOTAL ABDOMINAL HYSTERECTOMY      Social History   Tobacco Use   Smoking status: Former    Current packs/day: 0.00    Average packs/day: 0.5 packs/day for 25.0 years (12.5 ttl pk-yrs)    Types: Cigarettes, E-cigarettes    Start date: 67    Quit date: 2018    Years since quitting: 6.9   Smokeless tobacco: Never   Tobacco comments:    07/27/22 - currently vapes  Vaping Use   Vaping status: Every Day   Substances: Nicotine, Flavoring   Devices: [place]  Substance Use Topics   Alcohol use: No    Alcohol/week: 0.0 standard drinks of alcohol    Comment: 1 month   Drug use: No     Medication list has been reviewed and updated.  Current Meds  Medication Sig   acetaminophen (TYLENOL) 325 MG tablet Take 650 mg by mouth every 6 (six) hours as needed.   DULoxetine (CYMBALTA) 60 MG capsule Take 120 mg by mouth daily.   esomeprazole (NEXIUM) 40 MG capsule TAKE 1  CAPSULE (40 MG TOTAL) BY MOUTH 2 (TWO) TIMES DAILY BEFORE A MEAL. MUST SCHEDULE OFFICE VISIT   gabapentin (NEURONTIN) 600 MG tablet Take 600 mg by mouth 3 (three) times daily.   hydrochlorothiazide (HYDRODIURIL) 25 MG tablet Take 25 mg by mouth daily.   HYDROcodone-acetaminophen (NORCO) 10-325 MG tablet Take 1 tablet by mouth every 8 (eight) hours as needed. Dr Metta Clines   lidocaine (LIDODERM) 5 % PLEASE SEE ATTACHED FOR DETAILED DIRECTIONS   methadone (DOLOPHINE) 5 MG tablet Limit 2-3 tabs by mouth per day if tolerated (Patient taking differently: Take 5 mg by mouth. Limit 1 by mouth per day if tolerated- Dr Metta Clines)   metoprolol succinate (TOPROL-XL) 25 MG 24 hr tablet Take 1 tablet (25 mg total) by mouth daily.   Multiple Vitamin (MULTI-VITAMINS) TABS Take by mouth.    mupirocin ointment (BACTROBAN) 2 % Apply 1 application topically 2 (two) times daily.   nitroGLYCERIN (NITROSTAT) 0.4 MG SL tablet Place under the tongue.   nortriptyline (PAMELOR) 10 MG capsule TAKE 10 MG AT NIGHT FOR ONE WEEK THEN INCREASE TO 20 MG AT NIGHT   oxybutynin (DITROPAN-XL) 5 MG 24 hr tablet TAKE 1 TABLET BY MOUTH EVERYDAY AT BEDTIME   prednisoLONE acetate (PRED FORTE) 1 % ophthalmic suspension PLEASE SEE ATTACHED FOR DETAILED DIRECTIONS   simvastatin (ZOCOR) 80 MG tablet Take 1 tablet (80 mg total) by mouth daily.   tiZANidine (ZANAFLEX) 2 MG tablet Limit  2-4 tablets by mouth per day tolerated (Patient taking differently: Take 2 mg by mouth once. Limit  2-4 tablets by mouth per day tolerated/ Dr Metta Clines)   topiramate (TOPAMAX) 50 MG tablet Take 1 tablet (50 mg total) by mouth 2 (two) times daily.   tretinoin (RETIN-A) 0.05 % cream Apply 1 Application topically at bedtime.   Vitamin D, Ergocalciferol, (DRISDOL) 1.25 MG (50000 UNIT) CAPS capsule TAKE 1 CAPSULE EVERY 7 DAYS.   [DISCONTINUED] buPROPion (WELLBUTRIN XL) 150 MG 24 hr tablet TAKE 1 TABLET BY MOUTH EVERY DAY IN THE MORNING   [DISCONTINUED] busPIRone (BUSPAR) 30 MG tablet Take 1 tablet (30 mg total) by mouth 2 (two) times daily.   [DISCONTINUED] ezetimibe (ZETIA) 10 MG tablet TAKE 1 TABLET (10 MG TOTAL) BY MOUTH DAILY. REPORTED ON 10/15/2015   [DISCONTINUED] losartan (COZAAR) 25 MG tablet TAKE 1 TABLET (25 MG TOTAL) BY MOUTH DAILY.   [DISCONTINUED] metoprolol succinate (TOPROL-XL) 100 MG 24 hr tablet TAKE WITH OR IMMEDIATELY FOLLOWING A MEAL. (Patient taking differently: 25 mg. Take with or immediately following a meal.)   [DISCONTINUED] rosuvastatin (CRESTOR) 10 MG tablet Take 1 tablet by mouth daily.   Current Facility-Administered Medications for the 06/27/23 encounter (Office Visit) with Duanne Limerick, MD  Medication   bupivacaine (MARCAINE) 0.5 % injection 30 mL   bupivacaine (PF) (MARCAINE) 0.25 % injection 30 mL    bupivacaine (PF) (MARCAINE) 0.25 % injection 30 mL   fentaNYL (SUBLIMAZE) injection 100 mcg   fentaNYL (SUBLIMAZE) injection 100 mcg   fentaNYL (SUBLIMAZE) injection 100 mcg   lactated ringers infusion 1,000 mL   lactated ringers infusion 1,000 mL   lactated ringers infusion 1,000 mL   lactated ringers infusion 1,000 mL   lidocaine (PF) (XYLOCAINE) 1 % injection 10 mL   midazolam (VERSED) 5 MG/5ML injection 5 mg   midazolam (VERSED) 5 MG/5ML injection 5 mg   midazolam (VERSED) 5 MG/5ML injection 5 mg   orphenadrine (NORFLEX) injection 60 mg   orphenadrine (NORFLEX) injection 60 mg   orphenadrine (NORFLEX) injection  60 mg   sodium chloride 0.9 % injection 20 mL   triamcinolone acetonide (KENALOG-40) injection 40 mg   triamcinolone acetonide (KENALOG-40) injection 40 mg   triamcinolone acetonide (KENALOG-40) injection 40 mg       06/27/2023    3:34 PM 08/24/2022    2:34 PM 06/09/2022   11:02 AM 12/02/2021   11:09 AM  GAD 7 : Generalized Anxiety Score  Nervous, Anxious, on Edge 1 0 0 1  Control/stop worrying 1 0 0 2  Worry too much - different things 1 0 0 3  Trouble relaxing 1 0 0 2  Restless 1 0 0 1  Easily annoyed or irritable 2 0 0 2  Afraid - awful might happen 1 0 0 0  Total GAD 7 Score 8 0 0 11  Anxiety Difficulty Not difficult at all Not difficult at all Not difficult at all Very difficult       06/27/2023    3:33 PM 08/24/2022    2:34 PM 06/09/2022   11:02 AM  Depression screen PHQ 2/9  Decreased Interest 1 0 0  Down, Depressed, Hopeless 1 0 0  PHQ - 2 Score 2 0 0  Altered sleeping 1 0 0  Tired, decreased energy 2 0 0  Change in appetite 0 0 0  Feeling bad or failure about yourself  1 0 0  Trouble concentrating 2 0 0  Moving slowly or fidgety/restless 0 0 0  Suicidal thoughts 0 0 0  PHQ-9 Score 8 0 0  Difficult doing work/chores Somewhat difficult Not difficult at all Not difficult at all    BP Readings from Last 3 Encounters:  06/27/23 130/80  05/01/23  (!) 162/103  08/24/22 (!) 140/100    Physical Exam Vitals and nursing note reviewed.  Constitutional:      General: She is irritable.     Appearance: She is well-developed.  HENT:     Head: Normocephalic.     Right Ear: External ear normal.     Left Ear: External ear normal.     Nose: No congestion or rhinorrhea.  Eyes:     General: Lids are everted, no foreign bodies appreciated. No scleral icterus.       Left eye: No foreign body or hordeolum.     Conjunctiva/sclera: Conjunctivae normal.     Right eye: Right conjunctiva is not injected.     Left eye: Left conjunctiva is not injected.     Pupils: Pupils are equal, round, and reactive to light.  Neck:     Thyroid: No thyromegaly.     Vascular: No JVD.     Trachea: No tracheal deviation.  Cardiovascular:     Rate and Rhythm: Normal rate and regular rhythm.     Heart sounds: Normal heart sounds. No murmur heard.    No friction rub. No gallop.  Pulmonary:     Effort: Pulmonary effort is normal. No respiratory distress.     Breath sounds: Normal breath sounds. No wheezing, rhonchi or rales.  Abdominal:     General: Bowel sounds are normal.     Palpations: Abdomen is soft. There is no mass.     Tenderness: There is no abdominal tenderness. There is no guarding or rebound.  Musculoskeletal:        General: No tenderness. Normal range of motion.     Cervical back: Normal range of motion and neck supple.  Lymphadenopathy:     Cervical: No cervical adenopathy.  Skin:  General: Skin is warm.     Findings: No rash.  Neurological:     Mental Status: She is alert and oriented to person, place, and time.     Cranial Nerves: No cranial nerve deficit.     Deep Tendon Reflexes: Reflexes normal.  Psychiatric:        Mood and Affect: Mood is not anxious or depressed.     Wt Readings from Last 3 Encounters:  06/27/23 187 lb (84.8 kg)  05/01/23 192 lb (87.1 kg)  08/24/22 196 lb (88.9 kg)    BP 130/80   Pulse 65   Ht 5\' 10"   (1.778 m)   Wt 187 lb (84.8 kg)   SpO2 99%   BMI 26.83 kg/m   Assessment and Plan: 1. Essential hypertension (Primary) Chronic.  Controlled.  Stable.  Patient has not been seen by me in over a year and she states that the cardiologist says that she "does not need a cardiologist "and that that is why she is back here this patient is concerned about the amount of medication she is on but when I pointed out she is on 2 medications for hypertension 2 for her cholesterol 1 for depression and 1 for anxiety she understands the necessity of having a relatively small number of medications at this time.  We will continue her losartan 25 mg once a day and metoprolol XL 25 mg once a day and will recheck blood pressure in 2 weeks. - losartan (COZAAR) 25 MG tablet; Take 1 tablet (25 mg total) by mouth daily.  Dispense: 30 tablet; Refill: 0 - metoprolol succinate (TOPROL-XL) 25 MG 24 hr tablet; Take 1 tablet (25 mg total) by mouth daily.  Dispense: 30 tablet; Refill: 0  2. Mixed hyperlipidemia Chronic.  Controlled.  Stable.  Patient is currently on a combination of Zetia and Crestor both of 10 mg once a day.  Will recheck in 2 weeks at which time we will do a fasting lipid panel. - ezetimibe (ZETIA) 10 MG tablet; Take 1 tablet (10 mg total) by mouth daily. Reported on 10/15/2015  Dispense: 30 tablet; Refill: 0 - rosuvastatin (CRESTOR) 10 MG tablet; Take 1 tablet (10 mg total) by mouth daily.  Dispense: 30 tablet; Refill: 0  3. Depression, recurrent (HCC) Chronic.  Controlled.  Stable.  GAD score is 8.  We will continue bupropion XL 150 mg once a day.  Patient will return in 2 weeks with medications to reemphasize the dosing and to make sure this is indeed the dosing that were out. - buPROPion (WELLBUTRIN XL) 150 MG 24 hr tablet; TAKE 1 TABLET BY MOUTH EVERY DAY IN THE MORNING  Dispense: 30 tablet; Refill: 0  4. Chronic anxiety Chronic.  Persistent.  Stable.  GAD score is 9.  Continue buspirone at 30 mg twice a  day will recheck in 2 weeks bringing medication to solidify the dosing there. - busPIRone (BUSPAR) 30 MG tablet; Take 1 tablet (30 mg total) by mouth 2 (two) times daily.  Dispense: 60 tablet; Refill: 0     Elizabeth Sauer, MD

## 2023-06-27 NOTE — Patient Instructions (Signed)

## 2023-06-27 NOTE — Progress Notes (Signed)
Date:  06/27/2023   Name:  Jamie Kim   DOB:  1960-12-18   MRN:  [id number]   Chief Complaint: Hypertension, Hyperlipidemia, and Depression  Hypertension This is a chronic problem. The current episode started more than 1 year ago. The problem has been gradually improving since onset. The problem is controlled. Associated symptoms include headaches. Pertinent negatives include no anxiety, blurred vision, chest pain, malaise/fatigue, neck pain, orthopnea, palpitations, peripheral edema, PND, shortness of breath or sweats. There are no associated agents to hypertension. Risk factors for coronary artery disease include dyslipidemia. Past treatments include angiotensin blockers and beta blockers. There are no compliance problems.  There is no history of chronic renal disease.  Hyperlipidemia This is a chronic problem. The current episode started more than 1 year ago. The problem is controlled. Recent lipid tests were reviewed and are normal. She has no history of chronic renal disease, diabetes, hypothyroidism, liver disease, obesity or nephrotic syndrome. There are no known factors aggravating her hyperlipidemia. Pertinent negatives include no chest pain or shortness of breath. Current antihyperlipidemic treatment includes statins and ezetimibe. The current treatment provides moderate improvement of lipids. Risk factors for coronary artery disease include dyslipidemia.  Depression        The problem has been gradually improving since onset.  Associated symptoms include decreased concentration, irritable, decreased interest, headaches and sad.  Associated symptoms include no suicidal ideas.  Past treatments include other medications.   Pertinent negatives include no hypothyroidism and no anxiety.   Lab Results  Component Value Date   NA 138 05/01/2023   K 4.2 05/01/2023   CO2 26 05/01/2023   GLUCOSE 109 (H) 05/01/2023   BUN 11 05/01/2023   CREATININE 0.94 05/01/2023   CALCIUM 9.4  05/01/2023   EGFR 77 06/09/2022   GFRNONAA >60 05/01/2023   Lab Results  Component Value Date   CHOL 253 (H) 06/09/2022   HDL 55 06/09/2022   LDLCALC 146 (H) 06/09/2022   TRIG 288 (H) 06/09/2022   CHOLHDL 2.5 11/02/2018   Lab Results  Component Value Date   TSH 1.690 12/07/2019   No results found for: "HGBA1C" Lab Results  Component Value Date   WBC 5.5 05/01/2023   HGB 12.7 05/01/2023   HCT 38.5 05/01/2023   MCV 91.4 05/01/2023   PLT 403 (H) 05/01/2023   Lab Results  Component Value Date   ALT 12 06/09/2022   AST 16 06/09/2022   ALKPHOS 72 06/09/2022   BILITOT <0.2 06/09/2022   No results found for: "25OHVITD2", "25OHVITD3", "VD25OH"   Review of Systems  Constitutional:  Negative for malaise/fatigue and unexpected weight change.  Eyes:  Negative for blurred vision and visual disturbance.  Respiratory:  Negative for choking, chest tightness, shortness of breath and wheezing.   Cardiovascular:  Negative for chest pain, palpitations, orthopnea and PND.  Gastrointestinal:  Negative for abdominal distention.  Musculoskeletal:  Negative for neck pain.  Neurological:  Positive for headaches.  Psychiatric/Behavioral:  Positive for decreased concentration and depression. Negative for suicidal ideas.     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   Family history of malignant neoplasm of gastrointestinal tract    Benign neoplasm of ascending colon    Benign neoplasm of transverse colon    Polyp of sigmoid colon    Gastric polyp  Status post Nissen fundoplication (without gastrostomy tube) procedure    Esophageal dysphagia    Heartburn    Essential hypertension 09/03/2016   Periodic headache syndrome, not intractable 09/03/2016   Gastroesophageal reflux disease 09/03/2016   Multiple thyroid nodules  09/03/2016   Vitamin D deficiency 09/03/2016   Chronic anxiety 09/03/2016   Depression, recurrent (HCC) 09/03/2016   DDD (degenerative disc disease), lumbar 02/26/2015   Bilateral occipital neuralgia 01/27/2015   Spinal stenosis of lumbar region 11/25/2014   DDD (degenerative disc disease), cervical 11/24/2014   Status post cervical spinal fusion 11/24/2014   Lumbosacral facet joint syndrome 11/24/2014   Sacroiliac joint dysfunction of both sides 11/24/2014   Greater trochanteric bursitis of both hips 11/24/2014   Cervical facet syndrome 11/24/2014   Hyperlipidemia    Chest pain 10/26/2012   Palpitations 10/26/2012    Allergies  Allergen Reactions   Codeine Itching   Hydrocodone Itching   Morphine And Codeine    Oxycodone Itching   Morphine Itching   Oxybutynin Itching   Tramadol-Acetaminophen Itching    Past Surgical History:  Procedure Laterality Date   ANTERIOR CERVICAL DECOMP/DISCECTOMY FUSION     BLADDER SUSPENSION     CARDIAC CATHETERIZATION  11/23/12   [facility];EF 65%   CARPAL TUNNEL RELEASE Bilateral    CHOLECYSTECTOMY     COLONOSCOPY WITH PROPOFOL N/A 06/13/2018   Procedure: COLONOSCOPY WITH BIOPSIES;  Surgeon: Pasty Spillers, MD;  Location: [facility];  Service: Endoscopy;  Laterality: N/A;   COLONOSCOPY WITH PROPOFOL N/A 08/02/2022   Procedure: COLONOSCOPY WITH PROPOFOL with polypectomy;  Surgeon: Midge Minium, MD;  Location: [facility];  Service: Endoscopy;  Laterality: N/A;   ESOPHAGOGASTRODUODENOSCOPY (EGD) WITH PROPOFOL N/A 06/13/2018   Procedure: ESOPHAGOGASTRODUODENOSCOPY (EGD) WITH BIOPSIES;  Surgeon: Pasty Spillers, MD;  Location: [facility];  Service: Endoscopy;  Laterality: N/A;   ESOPHAGOGASTRODUODENOSCOPY (EGD) WITH PROPOFOL N/A 08/02/2022   Procedure: ESOPHAGOGASTRODUODENOSCOPY (EGD) WITH PROPOFOL;  Surgeon: Midge Minium, MD;  Location: [facility];  Service: Endoscopy;  Laterality: N/A;  sleep apnea   HERNIA  REPAIR     KNEE SURGERY Left    LAPAROSCOPY     NECK SURGERY     NISSEN FUNDOPLICATION     POLYPECTOMY N/A 06/13/2018   Procedure: POLYPECTOMY;  Surgeon: Pasty Spillers, MD;  Location: [facility];  Service: Endoscopy;  Laterality: N/A;   TONSILLECTOMY     TOTAL ABDOMINAL HYSTERECTOMY      Social History   Tobacco Use   Smoking status: Former    Current packs/day: 0.00    Average packs/day: 0.5 packs/day for 25.0 years (12.5 ttl pk-yrs)    Types: Cigarettes, E-cigarettes    Start date: 53    Quit date: 2018    Years since quitting: 6.9   Smokeless tobacco: Never   Tobacco comments:    07/27/22 - currently vapes  Vaping Use   Vaping status: Every Day   Substances: Nicotine, Flavoring   Devices: [place] [employer]  Substance Use Topics   Alcohol use: No    Alcohol/week: 0.0 standard drinks of alcohol    Comment: 1 month   Drug use: No     Medication list has been reviewed and updated.  Current Meds  Medication Sig   acetaminophen (TYLENOL) 325 MG tablet Take 650 mg by mouth every 6 (six) hours as needed.   buPROPion (WELLBUTRIN XL) 150 MG 24 hr tablet TAKE 1 TABLET BY MOUTH EVERY DAY IN THE MORNING  busPIRone (BUSPAR) 30 MG tablet Take 1 tablet (30 mg total) by mouth 2 (two) times daily.   DULoxetine (CYMBALTA) 60 MG capsule Take 120 mg by mouth daily.   esomeprazole (NEXIUM) 40 MG capsule TAKE 1 CAPSULE (40 MG TOTAL) BY MOUTH 2 (TWO) TIMES DAILY BEFORE A MEAL. MUST SCHEDULE OFFICE VISIT   ezetimibe (ZETIA) 10 MG tablet TAKE 1 TABLET (10 MG TOTAL) BY MOUTH DAILY. REPORTED ON 10/15/2015   gabapentin (NEURONTIN) 600 MG tablet Take 600 mg by mouth 3 (three) times daily.   hydrochlorothiazide (HYDRODIURIL) 25 MG tablet Take 25 mg by mouth daily.   HYDROcodone-acetaminophen (NORCO) 10-325 MG tablet Take 1 tablet by mouth every 8 (eight) hours as needed. Dr Metta Clines   lidocaine (LIDODERM) 5 % PLEASE SEE ATTACHED FOR DETAILED DIRECTIONS   losartan (COZAAR) 25 MG tablet TAKE  1 TABLET (25 MG TOTAL) BY MOUTH DAILY.   methadone (DOLOPHINE) 5 MG tablet Limit 2-3 tabs by mouth per day if tolerated (Patient taking differently: Take 5 mg by mouth. Limit 1 by mouth per day if tolerated- Dr Metta Clines)   metoprolol succinate (TOPROL-XL) 100 MG 24 hr tablet TAKE WITH OR IMMEDIATELY FOLLOWING A MEAL. (Patient taking differently: 25 mg. Take with or immediately following a meal.)   Multiple Vitamin (MULTI-VITAMINS) TABS Take by mouth.   mupirocin ointment (BACTROBAN) 2 % Apply 1 application topically 2 (two) times daily.   nitroGLYCERIN (NITROSTAT) 0.4 MG SL tablet Place under the tongue.   nortriptyline (PAMELOR) 10 MG capsule TAKE 10 MG AT NIGHT FOR ONE WEEK THEN INCREASE TO 20 MG AT NIGHT   oxybutynin (DITROPAN-XL) 5 MG 24 hr tablet TAKE 1 TABLET BY MOUTH EVERYDAY AT BEDTIME   prednisoLONE acetate (PRED FORTE) 1 % ophthalmic suspension PLEASE SEE ATTACHED FOR DETAILED DIRECTIONS   rosuvastatin (CRESTOR) 10 MG tablet Take 1 tablet by mouth daily.   simvastatin (ZOCOR) 80 MG tablet Take 1 tablet (80 mg total) by mouth daily.   tiZANidine (ZANAFLEX) 2 MG tablet Limit  2-4 tablets by mouth per day tolerated (Patient taking differently: Take 2 mg by mouth once. Limit  2-4 tablets by mouth per day tolerated/ Dr Metta Clines)   topiramate (TOPAMAX) 50 MG tablet Take 1 tablet (50 mg total) by mouth 2 (two) times daily.   tretinoin (RETIN-A) 0.05 % cream Apply 1 Application topically at bedtime.   Vitamin D, Ergocalciferol, (DRISDOL) 1.25 MG (50000 UNIT) CAPS capsule TAKE 1 CAPSULE EVERY 7 DAYS.   Current Facility-Administered Medications for the 06/27/23 encounter (Office Visit) with Duanne Limerick, MD  Medication   bupivacaine (MARCAINE) 0.5 % injection 30 mL   bupivacaine (PF) (MARCAINE) 0.25 % injection 30 mL   bupivacaine (PF) (MARCAINE) 0.25 % injection 30 mL   fentaNYL (SUBLIMAZE) injection 100 mcg   fentaNYL (SUBLIMAZE) injection 100 mcg   fentaNYL (SUBLIMAZE) injection 100 mcg    lactated ringers infusion 1,000 mL   lactated ringers infusion 1,000 mL   lactated ringers infusion 1,000 mL   lactated ringers infusion 1,000 mL   lidocaine (PF) (XYLOCAINE) 1 % injection 10 mL   midazolam (VERSED) 5 MG/5ML injection 5 mg   midazolam (VERSED) 5 MG/5ML injection 5 mg   midazolam (VERSED) 5 MG/5ML injection 5 mg   orphenadrine (NORFLEX) injection 60 mg   orphenadrine (NORFLEX) injection 60 mg   orphenadrine (NORFLEX) injection 60 mg   sodium chloride 0.9 % injection 20 mL   triamcinolone acetonide (KENALOG-40) injection 40 mg   triamcinolone acetonide (KENALOG-40) injection  40 mg   triamcinolone acetonide (KENALOG-40) injection 40 mg       06/27/2023    3:34 PM 08/24/2022    2:34 PM 06/09/2022   11:02 AM 12/02/2021   11:09 AM  GAD 7 : Generalized Anxiety Score  Nervous, Anxious, on Edge 1 0 0 1  Control/stop worrying 1 0 0 2  Worry too much - different things 1 0 0 3  Trouble relaxing 1 0 0 2  Restless 1 0 0 1  Easily annoyed or irritable 2 0 0 2  Afraid - awful might happen 1 0 0 0  Total GAD 7 Score 8 0 0 11  Anxiety Difficulty Not difficult at all Not difficult at all Not difficult at all Very difficult       06/27/2023    3:33 PM 08/24/2022    2:34 PM 06/09/2022   11:02 AM  Depression screen PHQ 2/9  Decreased Interest 1 0 0  Down, Depressed, Hopeless 1 0 0  PHQ - 2 Score 2 0 0  Altered sleeping 1 0 0  Tired, decreased energy 2 0 0  Change in appetite 0 0 0  Feeling bad or failure about yourself  1 0 0  Trouble concentrating 2 0 0  Moving slowly or fidgety/restless 0 0 0  Suicidal thoughts 0 0 0  PHQ-9 Score 8 0 0  Difficult doing work/chores Somewhat difficult Not difficult at all Not difficult at all    BP Readings from Last 3 Encounters:  06/27/23 130/80  05/01/23 (!) 162/103  08/24/22 (!) 140/100    Physical Exam Vitals and nursing note reviewed.  Constitutional:      General: She is irritable.     Appearance: She is well-developed.   HENT:     Head: Normocephalic.     Right Ear: External ear normal.     Left Ear: External ear normal.     Nose: No congestion or rhinorrhea.  Eyes:     General: Lids are everted, no foreign bodies appreciated. No scleral icterus.       Left eye: No foreign body or hordeolum.     Conjunctiva/sclera: Conjunctivae normal.     Right eye: Right conjunctiva is not injected.     Left eye: Left conjunctiva is not injected.     Pupils: Pupils are equal, round, and reactive to light.  Neck:     Thyroid: No thyromegaly.     Vascular: No JVD.     Trachea: No tracheal deviation.  Cardiovascular:     Rate and Rhythm: Normal rate and regular rhythm.     Heart sounds: Normal heart sounds. No murmur heard.    No friction rub. No gallop.  Pulmonary:     Effort: Pulmonary effort is normal. No respiratory distress.     Breath sounds: Normal breath sounds. No wheezing, rhonchi or rales.  Abdominal:     General: Bowel sounds are normal.     Palpations: Abdomen is soft. There is no mass.     Tenderness: There is no abdominal tenderness. There is no guarding or rebound.  Musculoskeletal:        General: No tenderness. Normal range of motion.     Cervical back: Normal range of motion and neck supple.  Lymphadenopathy:     Cervical: No cervical adenopathy.  Skin:    General: Skin is warm.     Findings: No rash.  Neurological:     Mental Status: She is alert and oriented  to person, place, and time.     Cranial Nerves: No cranial nerve deficit.     Deep Tendon Reflexes: Reflexes normal.  Psychiatric:        Mood and Affect: Mood is not anxious or depressed.     Wt Readings from Last 3 Encounters:  06/27/23 187 lb (84.8 kg)  05/01/23 192 lb (87.1 kg)  08/24/22 196 lb (88.9 kg)    BP 130/80   Pulse 65   Ht 5\' 10"  (1.778 m)   Wt 187 lb (84.8 kg)   SpO2 99%   BMI 26.83 kg/m   Assessment and Plan: 1. Essential hypertension (Primary) Chronic.  Controlled.  Stable.  Patient has not been  seen by me in over a year and she states that the cardiologist says that she "does not need a cardiologist "and that that is why she is back here this patient is concerned about the amount of medication she is on but when I pointed out she is on 2 medications for hypertension 2 for her cholesterol 1 for depression and 1 for anxiety she understands the necessity of having a relatively small number of medications at this time.  We will continue her losartan 25 mg once a day and metoprolol XL 25 mg once a day and will recheck blood pressure in 2 weeks. - losartan (COZAAR) 25 MG tablet; Take 1 tablet (25 mg total) by mouth daily.  Dispense: 30 tablet; Refill: 0 - metoprolol succinate (TOPROL-XL) 25 MG 24 hr tablet; Take 1 tablet (25 mg total) by mouth daily.  Dispense: 30 tablet; Refill: 0  2. Mixed hyperlipidemia Chronic.  Controlled.  Stable.  Patient is currently on a combination of Zetia and Crestor both of 10 mg once a day.  Will recheck in 2 weeks at which time we will do a fasting lipid panel. - ezetimibe (ZETIA) 10 MG tablet; Take 1 tablet (10 mg total) by mouth daily. Reported on 10/15/2015  Dispense: 30 tablet; Refill: 0 - rosuvastatin (CRESTOR) 10 MG tablet; Take 1 tablet (10 mg total) by mouth daily.  Dispense: 30 tablet; Refill: 0  3. Depression, recurrent (HCC) Chronic.  Controlled.  Stable.  GAD score is 8.  We will continue bupropion XL 150 mg once a day.  Patient will return in 2 weeks with medications to reemphasize the dosing and to make sure this is indeed the dosing that were out. - buPROPion (WELLBUTRIN XL) 150 MG 24 hr tablet; TAKE 1 TABLET BY MOUTH EVERY DAY IN THE MORNING  Dispense: 30 tablet; Refill: 0  4. Chronic anxiety Chronic.  Persistent.  Stable.  GAD score is 9.  Continue buspirone at 30 mg twice a day will recheck in 2 weeks bringing medication to solidify the dosing there. - busPIRone (BUSPAR) 30 MG tablet; Take 1 tablet (30 mg total) by mouth 2 (two) times daily.   Dispense: 60 tablet; Refill: 0     Elizabeth Sauer, MD

## 2023-06-30 DIAGNOSIS — M5136 Other intervertebral disc degeneration, lumbar region with discogenic back pain only: Secondary | ICD-10-CM | POA: Diagnosis not present

## 2023-06-30 DIAGNOSIS — G894 Chronic pain syndrome: Secondary | ICD-10-CM | POA: Diagnosis not present

## 2023-06-30 DIAGNOSIS — Z79891 Long term (current) use of opiate analgesic: Secondary | ICD-10-CM | POA: Diagnosis not present

## 2023-06-30 DIAGNOSIS — Z5181 Encounter for therapeutic drug level monitoring: Secondary | ICD-10-CM | POA: Diagnosis not present

## 2023-06-30 DIAGNOSIS — M542 Cervicalgia: Secondary | ICD-10-CM | POA: Diagnosis not present

## 2023-06-30 DIAGNOSIS — M5459 Other low back pain: Secondary | ICD-10-CM | POA: Diagnosis not present

## 2023-07-11 ENCOUNTER — Ambulatory Visit: Payer: Medicare Other | Admitting: Family Medicine

## 2023-07-18 ENCOUNTER — Ambulatory Visit (INDEPENDENT_AMBULATORY_CARE_PROVIDER_SITE_OTHER): Payer: Medicare Other | Admitting: Family Medicine

## 2023-07-18 ENCOUNTER — Encounter: Payer: Self-pay | Admitting: Family Medicine

## 2023-07-18 DIAGNOSIS — F339 Major depressive disorder, recurrent, unspecified: Secondary | ICD-10-CM | POA: Diagnosis not present

## 2023-07-18 DIAGNOSIS — E559 Vitamin D deficiency, unspecified: Secondary | ICD-10-CM | POA: Diagnosis not present

## 2023-07-18 DIAGNOSIS — I10 Essential (primary) hypertension: Secondary | ICD-10-CM

## 2023-07-18 DIAGNOSIS — N3941 Urge incontinence: Secondary | ICD-10-CM

## 2023-07-18 DIAGNOSIS — K219 Gastro-esophageal reflux disease without esophagitis: Secondary | ICD-10-CM | POA: Diagnosis not present

## 2023-07-18 DIAGNOSIS — F419 Anxiety disorder, unspecified: Secondary | ICD-10-CM | POA: Diagnosis not present

## 2023-07-18 DIAGNOSIS — E782 Mixed hyperlipidemia: Secondary | ICD-10-CM

## 2023-07-18 MED ORDER — ROSUVASTATIN CALCIUM 10 MG PO TABS
10.0000 mg | ORAL_TABLET | Freq: Every day | ORAL | 1 refills | Status: AC
Start: 2023-07-18 — End: 2024-07-17

## 2023-07-18 MED ORDER — VITAMIN D (ERGOCALCIFEROL) 1.25 MG (50000 UNIT) PO CAPS: ORAL_CAPSULE | ORAL | 1 refills | Status: AC

## 2023-07-18 MED ORDER — METOPROLOL SUCCINATE ER 25 MG PO TB24: ORAL_TABLET | ORAL | 1 refills | Status: DC

## 2023-07-18 MED ORDER — LOSARTAN POTASSIUM 25 MG PO TABS: 25.0000 mg | ORAL_TABLET | Freq: Every day | ORAL | 1 refills | Status: DC

## 2023-07-18 MED ORDER — BUSPIRONE HCL 30 MG PO TABS: 30.0000 mg | ORAL_TABLET | Freq: Two times a day (BID) | ORAL | 1 refills | Status: DC

## 2023-07-18 MED ORDER — OXYBUTYNIN CHLORIDE ER 5 MG PO TB24: ORAL_TABLET | ORAL | 1 refills | Status: DC

## 2023-07-18 MED ORDER — EZETIMIBE 10 MG PO TABS: 10.0000 mg | ORAL_TABLET | Freq: Every day | ORAL | 1 refills | Status: DC

## 2023-07-18 MED ORDER — BUPROPION HCL ER (XL) 150 MG PO TB24: ORAL_TABLET | ORAL | 1 refills | Status: DC

## 2023-07-18 MED ORDER — ESOMEPRAZOLE MAGNESIUM 40 MG PO CPDR: 40.0000 mg | DELAYED_RELEASE_CAPSULE | Freq: Two times a day (BID) | ORAL | 1 refills | Status: DC

## 2023-07-18 NOTE — Progress Notes (Signed)
 Date:  07/18/2023   Name:  Jamie Kim   DOB:  June 16, 1961   MRN:  [id number]   Chief Complaint: Hypertension and Depression  Hypertension This is a chronic problem. The current episode started more than 1 year ago. The problem has been waxing and waning since onset. The problem is controlled. Associated symptoms include anxiety and palpitations. Pertinent negatives include no blurred vision, chest pain, headaches, malaise/fatigue, neck pain, orthopnea, peripheral edema, PND or shortness of breath. There are no associated agents to hypertension. There are no known risk factors for coronary artery disease. Past treatments include beta blockers. The current treatment provides moderate improvement. There are no compliance problems.  There is no history of angina, kidney disease, CAD/MI, CVA, heart failure, left ventricular hypertrophy, PVD or retinopathy. There is no history of chronic renal disease, a hypertension causing med or renovascular disease.  Depression        This is a chronic problem.  The current episode started more than 1 year ago.   The onset quality is sudden.   The problem occurs constantly.  The problem has been gradually improving since onset.  Associated symptoms include no decreased concentration, no helplessness, no hopelessness, not irritable, no restlessness, no headaches and not sad.  Past treatments include SSRIs - Selective serotonin reuptake inhibitors.  Compliance with treatment is good.  Previous treatment provided moderate relief.  Past medical history includes anxiety.   Hyperlipidemia This is a chronic problem. The current episode started more than 1 year ago. The problem is controlled. Recent lipid tests were reviewed and are normal. She has no history of chronic renal disease. Pertinent negatives include no chest pain or shortness of breath. Current antihyperlipidemic treatment includes ezetimibe . The current treatment provides moderate improvement of lipids.  Risk factors for coronary artery disease include hypertension and dyslipidemia.  Urinary Frequency  This is a chronic (overactive bladder) problem. The problem has been waxing and waning. Associated symptoms include frequency and urgency. Pertinent negatives include no chills or hematuria. Treatments tried: ditropan .  Anxiety Presents for follow-up visit. Symptoms include palpitations. Patient reports no chest pain, confusion, decreased concentration, depressed mood, excessive worry, irritability, nervous/anxious behavior, restlessness or shortness of breath.      Lab Results  Component Value Date   NA 138 05/01/2023   K 4.2 05/01/2023   CO2 26 05/01/2023   GLUCOSE 109 (H) 05/01/2023   BUN 11 05/01/2023   CREATININE 0.94 05/01/2023   CALCIUM  9.4 05/01/2023   EGFR 77 06/09/2022   GFRNONAA >60 05/01/2023   Lab Results  Component Value Date   CHOL 253 (H) 06/09/2022   HDL 55 06/09/2022   LDLCALC 146 (H) 06/09/2022   TRIG 288 (H) 06/09/2022   CHOLHDL 2.5 11/02/2018   Lab Results  Component Value Date   TSH 1.690 12/07/2019   No results found for: HGBA1C Lab Results  Component Value Date   WBC 5.5 05/01/2023   HGB 12.7 05/01/2023   HCT 38.5 05/01/2023   MCV 91.4 05/01/2023   PLT 403 (H) 05/01/2023   Lab Results  Component Value Date   ALT 12 06/09/2022   AST 16 06/09/2022   ALKPHOS 72 06/09/2022   BILITOT <0.2 06/09/2022   No results found for: 25OHVITD2, 25OHVITD3, VD25OH   Review of Systems  Constitutional:  Negative for chills, irritability and malaise/fatigue.  Eyes:  Negative for blurred vision and visual disturbance.  Respiratory:  Negative for choking, shortness of breath and wheezing.   Cardiovascular:  Positive for palpitations. Negative for chest pain, orthopnea and PND.  Gastrointestinal:  Negative for abdominal pain and blood in stool.  Endocrine: Negative for polydipsia and polyuria.  Genitourinary:  Positive for difficulty urinating,  frequency and urgency. Negative for hematuria.  Musculoskeletal:  Negative for neck pain.  Neurological:  Negative for headaches.  Psychiatric/Behavioral:  Positive for depression. Negative for confusion and decreased concentration. The patient is not nervous/anxious.     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    Esophageal dysphagia    Heartburn    Essential hypertension 09/03/2016   Periodic headache syndrome, not intractable 09/03/2016   Gastroesophageal reflux disease 09/03/2016   Multiple thyroid  nodules 09/03/2016   Vitamin D  deficiency 09/03/2016   Chronic anxiety 09/03/2016   Depression, recurrent (HCC) 09/03/2016   DDD (degenerative disc disease), lumbar 02/26/2015   Bilateral occipital neuralgia 01/27/2015   Spinal stenosis of lumbar region 11/25/2014   DDD (degenerative disc disease), cervical 11/24/2014   Status post cervical spinal fusion 11/24/2014   Lumbosacral facet joint syndrome 11/24/2014   Sacroiliac joint dysfunction of both sides 11/24/2014   Greater trochanteric bursitis of both hips 11/24/2014   Cervical facet syndrome 11/24/2014   Hyperlipidemia    Chest pain 10/26/2012   Palpitations 10/26/2012    Allergies  Allergen Reactions   Codeine Itching   Hydrocodone  Itching   Morphine And Codeine    Oxycodone  Itching   Morphine Itching   Oxybutynin  Itching   Tramadol-Acetaminophen  Itching    Past Surgical History:  Procedure Laterality Date   ANTERIOR CERVICAL DECOMP/DISCECTOMY FUSION     BLADDER SUSPENSION     CARDIAC  CATHETERIZATION  11/23/12   [facility];EF 65%   CARPAL TUNNEL RELEASE Bilateral    CHOLECYSTECTOMY     COLONOSCOPY WITH PROPOFOL  N/A 06/13/2018   Procedure: COLONOSCOPY WITH BIOPSIES;  Surgeon: Janalyn Keene NOVAK, MD;  Location: [facility];  Service: Endoscopy;  Laterality: N/A;   COLONOSCOPY WITH PROPOFOL  N/A 08/02/2022   Procedure: COLONOSCOPY WITH PROPOFOL  with polypectomy;  Surgeon: Jinny Carmine, MD;  Location: [facility];  Service: Endoscopy;  Laterality: N/A;   ESOPHAGOGASTRODUODENOSCOPY (EGD) WITH PROPOFOL  N/A 06/13/2018   Procedure: ESOPHAGOGASTRODUODENOSCOPY (EGD) WITH BIOPSIES;  Surgeon: Janalyn Keene NOVAK, MD;  Location: [facility];  Service: Endoscopy;  Laterality: N/A;   ESOPHAGOGASTRODUODENOSCOPY (EGD) WITH PROPOFOL  N/A 08/02/2022   Procedure: ESOPHAGOGASTRODUODENOSCOPY (EGD) WITH PROPOFOL ;  Surgeon: Jinny Carmine, MD;  Location: [facility];  Service: Endoscopy;  Laterality: N/A;  sleep apnea   HERNIA REPAIR     KNEE SURGERY Left    LAPAROSCOPY     NECK SURGERY     NISSEN FUNDOPLICATION     POLYPECTOMY N/A 06/13/2018   Procedure: POLYPECTOMY;  Surgeon: Janalyn Keene NOVAK, MD;  Location: [facility];  Service: Endoscopy;  Laterality: N/A;   TONSILLECTOMY     TOTAL ABDOMINAL HYSTERECTOMY      Social History   Tobacco Use   Smoking status: Former    Current packs/day: 0.00    Average packs/day: 0.5 packs/day  for 25.0 years (12.5 ttl pk-yrs)    Types: Cigarettes, E-cigarettes    Start date: 2    Quit date: 2018    Years since quitting: 7.0   Smokeless tobacco: Never   Tobacco comments:    07/27/22 - currently vapes  Vaping Use   Vaping status: Every Day   Substances: Nicotine, Flavoring   Devices: [place]  Substance Use Topics   Alcohol use: No    Alcohol/week: 0.0 standard drinks of alcohol    Comment: 1 month   Drug use: No     Medication list has been reviewed and updated.  Current Meds  Medication Sig    acetaminophen  (TYLENOL ) 325 MG tablet Take 650 mg by mouth every 6 (six) hours as needed.   buPROPion  (WELLBUTRIN  XL) 150 MG 24 hr tablet TAKE 1 TABLET BY MOUTH EVERY DAY IN THE MORNING   busPIRone  (BUSPAR ) 30 MG tablet Take 1 tablet (30 mg total) by mouth 2 (two) times daily.   DULoxetine  (CYMBALTA ) 60 MG capsule Take 120 mg by mouth daily.   esomeprazole  (NEXIUM ) 40 MG capsule TAKE 1 CAPSULE (40 MG TOTAL) BY MOUTH 2 (TWO) TIMES DAILY BEFORE A MEAL. MUST SCHEDULE OFFICE VISIT   ezetimibe  (ZETIA ) 10 MG tablet TAKE 1 TABLET (10 MG TOTAL) BY MOUTH DAILY. REPORTED ON 10/15/2015   gabapentin  (NEURONTIN ) 600 MG tablet Take 600 mg by mouth 3 (three) times daily.   hydrochlorothiazide (HYDRODIURIL) 25 MG tablet Take 25 mg by mouth daily.   HYDROcodone -acetaminophen  (NORCO) 10-325 MG tablet Take 1 tablet by mouth every 8 (eight) hours as needed. Dr Dannial   lidocaine  (LIDODERM ) 5 % PLEASE SEE ATTACHED FOR DETAILED DIRECTIONS   losartan  (COZAAR ) 25 MG tablet TAKE 1 TABLET (25 MG TOTAL) BY MOUTH DAILY.   methadone  (DOLOPHINE ) 5 MG tablet Limit 2-3 tabs by mouth per day if tolerated (Patient taking differently: Take 5 mg by mouth. Limit 1 by mouth per day if tolerated- Dr Dannial)   metoprolol  succinate (TOPROL -XL) 25 MG 24 hr tablet Take 1 tablet (25 mg total) by mouth daily.   oxybutynin  (DITROPAN -XL) 5 MG 24 hr tablet TAKE 1 TABLET BY MOUTH EVERYDAY AT BEDTIME   prednisoLONE acetate (PRED FORTE) 1 % ophthalmic suspension PLEASE SEE ATTACHED FOR DETAILED DIRECTIONS   rosuvastatin  (CRESTOR ) 10 MG tablet Take 1 tablet (10 mg total) by mouth daily.   simvastatin  (ZOCOR ) 80 MG tablet Take 1 tablet (80 mg total) by mouth daily.   tiZANidine  (ZANAFLEX ) 2 MG tablet Limit  2-4 tablets by mouth per day tolerated (Patient taking differently: Take 2 mg by mouth once. Limit  2-4 tablets by mouth per day tolerated/ Dr Dannial)   topiramate  (TOPAMAX ) 50 MG tablet Take 1 tablet (50 mg total) by mouth 2 (two) times daily.    tretinoin (RETIN-A) 0.05 % cream Apply 1 Application topically at bedtime.   Vitamin D , Ergocalciferol , (DRISDOL ) 1.25 MG (50000 UNIT) CAPS capsule TAKE 1 CAPSULE EVERY 7 DAYS.   Current Facility-Administered Medications for the 07/18/23 encounter (Office Visit) with Joshua Cathryne BROCKS, MD  Medication   bupivacaine  (MARCAINE ) 0.5 % injection 30 mL   bupivacaine  (PF) (MARCAINE ) 0.25 % injection 30 mL   bupivacaine  (PF) (MARCAINE ) 0.25 % injection 30 mL   fentaNYL  (SUBLIMAZE ) injection 100 mcg   fentaNYL  (SUBLIMAZE ) injection 100 mcg   fentaNYL  (SUBLIMAZE ) injection 100 mcg   lactated ringers  infusion 1,000 mL   lactated ringers  infusion 1,000 mL   lactated ringers  infusion 1,000 mL  lactated ringers  infusion 1,000 mL   lidocaine  (PF) (XYLOCAINE ) 1 % injection 10 mL   midazolam  (VERSED ) 5 MG/5ML injection 5 mg   midazolam  (VERSED ) 5 MG/5ML injection 5 mg   midazolam  (VERSED ) 5 MG/5ML injection 5 mg   orphenadrine  (NORFLEX ) injection 60 mg   orphenadrine  (NORFLEX ) injection 60 mg   orphenadrine  (NORFLEX ) injection 60 mg   sodium chloride  0.9 % injection 20 mL   triamcinolone  acetonide (KENALOG -40) injection 40 mg   triamcinolone  acetonide (KENALOG -40) injection 40 mg   triamcinolone  acetonide (KENALOG -40) injection 40 mg       07/18/2023    2:06 PM 06/27/2023    3:34 PM 08/24/2022    2:34 PM 06/09/2022   11:02 AM  GAD 7 : Generalized Anxiety Score  Nervous, Anxious, on Edge 0 1 0 0  Control/stop worrying 0 1 0 0  Worry too much - different things 0 1 0 0  Trouble relaxing 0 1 0 0  Restless 0 1 0 0  Easily annoyed or irritable 0 2 0 0  Afraid - awful might happen 0 1 0 0  Total GAD 7 Score 0 8 0 0  Anxiety Difficulty Not difficult at all Not difficult at all Not difficult at all Not difficult at all       07/18/2023    2:06 PM 06/27/2023    3:33 PM 08/24/2022    2:34 PM  Depression screen PHQ 2/9  Decreased Interest 0 1 0  Down, Depressed, Hopeless 0 1 0  PHQ - 2 Score 0 2 0   Altered sleeping 0 1 0  Tired, decreased energy 0 2 0  Change in appetite 0 0 0  Feeling bad or failure about yourself  0 1 0  Trouble concentrating 0 2 0  Moving slowly or fidgety/restless 0 0 0  Suicidal thoughts 0 0 0  PHQ-9 Score 0 8 0  Difficult doing work/chores Not difficult at all Somewhat difficult Not difficult at all    BP Readings from Last 3 Encounters:  07/18/23 120/72  06/27/23 130/80  05/01/23 (!) 162/103    Physical Exam Vitals and nursing note reviewed.  Constitutional:      General: She is not irritable.She is not in acute distress.    Appearance: She is not diaphoretic.  HENT:     Head: Normocephalic and atraumatic.     Right Ear: Tympanic membrane and external ear normal.     Left Ear: Tympanic membrane and external ear normal.     Nose: Nose normal. No congestion or rhinorrhea.     Mouth/Throat:     Pharynx: No oropharyngeal exudate or posterior oropharyngeal erythema.  Eyes:     General:        Right eye: No discharge.        Left eye: No discharge.     Conjunctiva/sclera: Conjunctivae normal.     Pupils: Pupils are equal, round, and reactive to light.  Neck:     Thyroid : No thyromegaly.     Vascular: No JVD.  Cardiovascular:     Rate and Rhythm: Normal rate and regular rhythm.     Heart sounds: Normal heart sounds, S1 normal and S2 normal. No murmur heard.    No systolic murmur is present.     No diastolic murmur is present.     No friction rub. No gallop. No S3 or S4 sounds.  Pulmonary:     Effort: Pulmonary effort is normal.     Breath  sounds: Normal breath sounds. No wheezing, rhonchi or rales.  Abdominal:     General: Bowel sounds are normal.     Palpations: Abdomen is soft. There is no mass.     Tenderness: There is no abdominal tenderness. There is no guarding.  Musculoskeletal:        General: Normal range of motion.     Cervical back: Normal range of motion and neck supple.  Lymphadenopathy:     Cervical: No cervical adenopathy.   Skin:    General: Skin is warm and dry.  Neurological:     Mental Status: She is alert.     Deep Tendon Reflexes: Reflexes are normal and symmetric.     Wt Readings from Last 3 Encounters:  07/18/23 191 lb 9.6 oz (86.9 kg)  06/27/23 187 lb (84.8 kg)  05/01/23 192 lb (87.1 kg)    BP 120/72   Pulse 74   Ht 5' 10 (1.778 m)   Wt 191 lb 9.6 oz (86.9 kg)   SpO2 99%   BMI 27.49 kg/m   Assessment and Plan: 1. Essential hypertension Chronic.  Controlled.  Stable.  Blood pressure today is 120/72.  Continue Cozaar  25 mg once a day and metoprolol  XL 1 twice a day. - losartan  (COZAAR ) 25 MG tablet; Take 1 tablet (25 mg total) by mouth daily.  Dispense: 90 tablet; Refill: 1 - metoprolol  succinate (TOPROL -XL) 25 MG 24 hr tablet; One twice a day  Dispense: 180 tablet; Refill: 1  2. Depression, recurrent (HCC) Chronic.  Controlled.  Stable.  GAD score is 0.  PHQ is 0.  Continue bupropion  XL 150 mg once a day. - buPROPion  (WELLBUTRIN  XL) 150 MG 24 hr tablet; TAKE 1 TABLET BY MOUTH EVERY DAY IN THE MORNING  Dispense: 90 tablet; Refill: 1  3. Chronic anxiety Chronic controlled.  Stable.  Asymptomatic.  Tolerating medication well.  Continue buspirone  30 mg twice a day. - busPIRone  (BUSPAR ) 30 MG tablet; Take 1 tablet (30 mg total) by mouth 2 (two) times daily.  Dispense: 180 tablet; Refill: 1  4. Gastroesophageal reflux disease Chronic.  Controlled.  Stable.  Continue Nexium  40 mg twice a day. - esomeprazole  (NEXIUM ) 40 MG capsule; Take 1 capsule (40 mg total) by mouth 2 (two) times daily before a meal. MUST SCHEDULE OFFICE VISIT  Dispense: 180 capsule; Refill: 1  5. Mixed hyperlipidemia Chronic.  Controlled.  Stable.  Tolerating current dosing of Zetia  10 mg once a day and Crestor  10 mg once a day.  Will check lipid panel for current level of control. - ezetimibe  (ZETIA ) 10 MG tablet; Take 1 tablet (10 mg total) by mouth daily. Reported on 10/15/2015  Dispense: 90 tablet; Refill: 1 -  rosuvastatin  (CRESTOR ) 10 MG tablet; Take 1 tablet (10 mg total) by mouth daily.  Dispense: 90 tablet; Refill: 1 - Lipid Panel With LDL/HDL Ratio  6. Urge incontinence Chronic.  Controlled.  Stable.  Tolerating current dosing of Ditropan  XL 5 mg nightly. - oxybutynin  (DITROPAN -XL) 5 MG 24 hr tablet; TAKE 1 TABLET BY MOUTH EVERYDAY AT BEDTIME  Dispense: 90 tablet; Refill: 1  7. Vitamin D  deficiency Chronic.  Controlled.  Stable.  Continue current vitamin D  supplementation 50,000 units weekly. - Vitamin D , Ergocalciferol , (DRISDOL ) 1.25 MG (50000 UNIT) CAPS capsule; Take 1 capsule every 7 days.  Dispense: 13 capsule; Refill: 1     Cathryne Molt, MD

## 2023-07-19 LAB — LIPID PANEL WITH LDL/HDL RATIO
Cholesterol, Total: 180 mg/dL (ref 100–199)
HDL: 75 mg/dL (ref >39)
LDL Chol Calc (NIH): 74 mg/dL (ref 0–99)
LDL/HDL Ratio: 1 {ratio} (ref 0.0–3.2)
Triglycerides: 188 mg/dL — ABNORMAL HIGH (ref 0–149)
VLDL Cholesterol Cal: 31 mg/dL (ref 5–40)

## 2023-07-21 NOTE — Progress Notes (Signed)
Called pt phone is not in service.  KP

## 2023-07-22 NOTE — Progress Notes (Signed)
3rd attempt labs printed and mailed.  KP 

## 2023-07-28 DIAGNOSIS — Z79891 Long term (current) use of opiate analgesic: Secondary | ICD-10-CM | POA: Diagnosis not present

## 2023-07-28 DIAGNOSIS — M5459 Other low back pain: Secondary | ICD-10-CM | POA: Diagnosis not present

## 2023-07-28 DIAGNOSIS — G894 Chronic pain syndrome: Secondary | ICD-10-CM | POA: Diagnosis not present

## 2023-07-28 DIAGNOSIS — M542 Cervicalgia: Secondary | ICD-10-CM | POA: Diagnosis not present

## 2023-07-28 DIAGNOSIS — Z5181 Encounter for therapeutic drug level monitoring: Secondary | ICD-10-CM | POA: Diagnosis not present

## 2023-08-05 DIAGNOSIS — G894 Chronic pain syndrome: Secondary | ICD-10-CM | POA: Diagnosis not present

## 2023-08-05 DIAGNOSIS — Z79891 Long term (current) use of opiate analgesic: Secondary | ICD-10-CM | POA: Diagnosis not present

## 2023-08-05 DIAGNOSIS — Z5181 Encounter for therapeutic drug level monitoring: Secondary | ICD-10-CM | POA: Diagnosis not present

## 2023-08-05 DIAGNOSIS — M5459 Other low back pain: Secondary | ICD-10-CM | POA: Diagnosis not present

## 2023-09-01 DIAGNOSIS — M542 Cervicalgia: Secondary | ICD-10-CM | POA: Diagnosis not present

## 2023-09-01 DIAGNOSIS — M5459 Other low back pain: Secondary | ICD-10-CM | POA: Diagnosis not present

## 2023-09-01 DIAGNOSIS — Z5181 Encounter for therapeutic drug level monitoring: Secondary | ICD-10-CM | POA: Diagnosis not present

## 2023-09-01 DIAGNOSIS — G894 Chronic pain syndrome: Secondary | ICD-10-CM | POA: Diagnosis not present

## 2023-09-01 DIAGNOSIS — Z79891 Long term (current) use of opiate analgesic: Secondary | ICD-10-CM | POA: Diagnosis not present

## 2023-09-21 DIAGNOSIS — Z5181 Encounter for therapeutic drug level monitoring: Secondary | ICD-10-CM | POA: Diagnosis not present

## 2023-09-21 DIAGNOSIS — Z79891 Long term (current) use of opiate analgesic: Secondary | ICD-10-CM | POA: Diagnosis not present

## 2023-09-21 DIAGNOSIS — G894 Chronic pain syndrome: Secondary | ICD-10-CM | POA: Diagnosis not present

## 2023-09-21 DIAGNOSIS — M5459 Other low back pain: Secondary | ICD-10-CM | POA: Diagnosis not present

## 2023-09-22 ENCOUNTER — Encounter: Payer: Self-pay | Admitting: Neurology

## 2023-09-22 DIAGNOSIS — M5412 Radiculopathy, cervical region: Secondary | ICD-10-CM | POA: Diagnosis not present

## 2023-09-22 DIAGNOSIS — M542 Cervicalgia: Secondary | ICD-10-CM | POA: Diagnosis not present

## 2023-09-28 ENCOUNTER — Other Ambulatory Visit: Payer: Self-pay

## 2023-09-28 DIAGNOSIS — R202 Paresthesia of skin: Secondary | ICD-10-CM

## 2023-09-29 ENCOUNTER — Other Ambulatory Visit: Payer: Self-pay

## 2023-09-29 ENCOUNTER — Encounter: Payer: Self-pay | Admitting: Family Medicine

## 2023-09-29 ENCOUNTER — Telehealth: Payer: Self-pay | Admitting: Family Medicine

## 2023-09-29 ENCOUNTER — Ambulatory Visit (INDEPENDENT_AMBULATORY_CARE_PROVIDER_SITE_OTHER): Admitting: Family Medicine

## 2023-09-29 VITALS — BP 158/88 | HR 89 | Resp 16 | Ht 70.0 in | Wt 198.1 lb

## 2023-09-29 DIAGNOSIS — M542 Cervicalgia: Secondary | ICD-10-CM | POA: Diagnosis not present

## 2023-09-29 DIAGNOSIS — G894 Chronic pain syndrome: Secondary | ICD-10-CM | POA: Diagnosis not present

## 2023-09-29 DIAGNOSIS — Z79891 Long term (current) use of opiate analgesic: Secondary | ICD-10-CM | POA: Diagnosis not present

## 2023-09-29 DIAGNOSIS — I10 Essential (primary) hypertension: Secondary | ICD-10-CM

## 2023-09-29 DIAGNOSIS — Z5181 Encounter for therapeutic drug level monitoring: Secondary | ICD-10-CM | POA: Diagnosis not present

## 2023-09-29 DIAGNOSIS — M5459 Other low back pain: Secondary | ICD-10-CM | POA: Diagnosis not present

## 2023-09-29 MED ORDER — LOSARTAN POTASSIUM 50 MG PO TABS: 50.0000 mg | ORAL_TABLET | Freq: Every day | ORAL | 0 refills | Status: DC

## 2023-09-29 MED ORDER — METOPROLOL SUCCINATE ER 50 MG PO TB24: 50.0000 mg | ORAL_TABLET | Freq: Every day | ORAL | 0 refills | Status: DC

## 2023-09-29 NOTE — Telephone Encounter (Signed)
 Patient need prescription twice a day, Patient stated Dr. Yetta Barre stated that she was changing it the last time she was in here but its not an rx for it. Patient is currently here with her grandson. Lucila Maine has an np appt today. fyi

## 2023-09-29 NOTE — Telephone Encounter (Signed)
 Pt scheduled for a appt.  KP

## 2023-09-29 NOTE — Progress Notes (Signed)
 Date:  09/29/2023   Name:  Jamie Kim   DOB:  [birth year]   MRN:  [id number]   Chief Complaint: Hypertension and Headache  Hypertension This is a chronic problem. The current episode started more than 1 year ago. The problem has been waxing and waning since onset. The problem is uncontrolled. Associated symptoms include headaches and neck pain. Pertinent negatives include no anxiety, blurred vision, chest pain, malaise/fatigue, orthopnea, palpitations, peripheral edema, PND, shortness of breath or sweats. There are no associated agents to hypertension. Past treatments include angiotensin blockers and beta blockers. The current treatment provides moderate improvement. There are no compliance problems.     Lab Results  Component Value Date   NA 138 05/01/2023   K 4.2 05/01/2023   CO2 26 05/01/2023   GLUCOSE 109 (H) 05/01/2023   BUN 11 05/01/2023   CREATININE 0.94 05/01/2023   CALCIUM 9.4 05/01/2023   EGFR 77 06/09/2022   GFRNONAA >60 05/01/2023   Lab Results  Component Value Date   CHOL 180 07/18/2023   HDL 75 07/18/2023   LDLCALC 74 07/18/2023   TRIG 188 (H) 07/18/2023   CHOLHDL 2.5 11/02/2018   Lab Results  Component Value Date   TSH 1.690 12/07/2019   No results found for: "HGBA1C" Lab Results  Component Value Date   WBC 5.5 05/01/2023   HGB 12.7 05/01/2023   HCT 38.5 05/01/2023   MCV 91.4 05/01/2023   PLT 403 (H) 05/01/2023   Lab Results  Component Value Date   ALT 12 06/09/2022   AST 16 06/09/2022   ALKPHOS 72 06/09/2022   BILITOT <0.2 06/09/2022   No results found for: "25OHVITD2", "25OHVITD3", "VD25OH"   Review of Systems  Constitutional:  Negative for chills, fatigue, malaise/fatigue and unexpected weight change.  HENT:  Negative for congestion.   Eyes:  Negative for blurred vision and visual disturbance.  Respiratory:  Negative for cough, choking, chest tightness, shortness of breath and wheezing.   Cardiovascular:  Negative for chest pain,  palpitations, orthopnea and PND.  Gastrointestinal:  Negative for abdominal pain.  Musculoskeletal:  Positive for neck pain.  Neurological:  Positive for headaches.    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    Esophageal dysphagia    Heartburn    Essential hypertension 09/03/2016   Periodic headache syndrome, not intractable 09/03/2016   Gastroesophageal reflux disease 09/03/2016   Multiple thyroid nodules 09/03/2016   Vitamin D deficiency 09/03/2016   Chronic anxiety 09/03/2016   Depression, recurrent (HCC) 09/03/2016   DDD (degenerative disc disease), lumbar 02/26/2015   Bilateral occipital neuralgia 01/27/2015   Spinal stenosis of lumbar region 11/25/2014   DDD (degenerative disc disease), cervical 11/24/2014   Status post cervical spinal fusion 11/24/2014   Lumbosacral facet joint syndrome 11/24/2014   Sacroiliac joint dysfunction of both sides 11/24/2014   Greater trochanteric bursitis of both hips 11/24/2014   Cervical facet syndrome 11/24/2014   Hyperlipidemia    Chest pain 10/26/2012   Palpitations 10/26/2012    Allergies  Allergen Reactions  Codeine Itching   Hydrocodone Itching   Morphine And Codeine    Oxycodone Itching   Morphine Itching   Oxybutynin Itching   Tramadol-Acetaminophen Itching    Past Surgical History:  Procedure Laterality Date   ANTERIOR CERVICAL DECOMP/DISCECTOMY FUSION     BLADDER SUSPENSION     CARDIAC CATHETERIZATION  11/23/12   [facility];EF 65%   CARPAL TUNNEL RELEASE Bilateral    CHOLECYSTECTOMY      COLONOSCOPY WITH PROPOFOL N/A 06/13/2018   Procedure: COLONOSCOPY WITH BIOPSIES;  Surgeon: Pasty Spillers, MD;  Location: [facility];  Service: Endoscopy;  Laterality: N/A;   COLONOSCOPY WITH PROPOFOL N/A 08/02/2022   Procedure: COLONOSCOPY WITH PROPOFOL with polypectomy;  Surgeon: Midge Minium, MD;  Location: [facility];  Service: Endoscopy;  Laterality: N/A;   ESOPHAGOGASTRODUODENOSCOPY (EGD) WITH PROPOFOL N/A 06/13/2018   Procedure: ESOPHAGOGASTRODUODENOSCOPY (EGD) WITH BIOPSIES;  Surgeon: Pasty Spillers, MD;  Location: [facility];  Service: Endoscopy;  Laterality: N/A;   ESOPHAGOGASTRODUODENOSCOPY (EGD) WITH PROPOFOL N/A 08/02/2022   Procedure: ESOPHAGOGASTRODUODENOSCOPY (EGD) WITH PROPOFOL;  Surgeon: Midge Minium, MD;  Location: [facility];  Service: Endoscopy;  Laterality: N/A;  sleep apnea   HERNIA REPAIR     KNEE SURGERY Left    LAPAROSCOPY     NECK SURGERY     NISSEN FUNDOPLICATION     POLYPECTOMY N/A 06/13/2018   Procedure: POLYPECTOMY;  Surgeon: Pasty Spillers, MD;  Location: [facility];  Service: Endoscopy;  Laterality: N/A;   TONSILLECTOMY     TOTAL ABDOMINAL HYSTERECTOMY      Social History   Tobacco Use   Smoking status: Former    Current packs/day: 0.00    Average packs/day: 0.5 packs/day for 25.0 years (12.5 ttl pk-yrs)    Types: Cigarettes, E-cigarettes    Start date: 59    Quit date: 2018    Years since quitting: 7.2   Smokeless tobacco: Never   Tobacco comments:    07/27/22 - currently vapes  Vaping Use   Vaping status: Every Day   Substances: Nicotine, Flavoring   Devices: [place]  Substance Use Topics   Alcohol use: No    Alcohol/week: 0.0 standard drinks of alcohol    Comment: 1 month   Drug use: No     Medication list has been reviewed and updated.  Current Meds  Medication Sig   acetaminophen (TYLENOL) 325 MG tablet Take 650 mg by mouth every 6 (six) hours as needed.   buPROPion  (WELLBUTRIN XL) 150 MG 24 hr tablet TAKE 1 TABLET BY MOUTH EVERY DAY IN THE MORNING   busPIRone (BUSPAR) 30 MG tablet Take 1 tablet (30 mg total) by mouth 2 (two) times daily.   DULoxetine (CYMBALTA) 60 MG capsule Take 120 mg by mouth daily.   esomeprazole (NEXIUM) 40 MG capsule Take 1 capsule (40 mg total) by mouth 2 (two) times daily before a meal. MUST SCHEDULE OFFICE VISIT   ezetimibe (ZETIA) 10 MG tablet Take 1 tablet (10 mg total) by mouth daily. Reported on 10/15/2015   gabapentin (NEURONTIN) 600 MG tablet Take 600 mg by mouth 3 (three) times daily.   lidocaine (LIDODERM) 5 % PLEASE SEE ATTACHED FOR DETAILED DIRECTIONS   losartan (COZAAR) 25 MG tablet Take 1 tablet (25 mg total) by mouth daily.   methadone (DOLOPHINE) 5 MG tablet Limit 2-3 tabs by mouth per day if tolerated (Patient taking differently: Take 5 mg by mouth. Limit 1 by mouth per day if tolerated-  Dr Metta Clines)   metoprolol succinate (TOPROL-XL) 25 MG 24 hr tablet One twice a day   Multiple Vitamin (MULTI-VITAMINS) TABS Take by mouth.   mupirocin ointment (BACTROBAN) 2 % Apply 1 application topically 2 (two) times daily.   nortriptyline (PAMELOR) 10 MG capsule TAKE 10 MG AT NIGHT FOR ONE WEEK THEN INCREASE TO 20 MG AT NIGHT   oxybutynin (DITROPAN-XL) 5 MG 24 hr tablet TAKE 1 TABLET BY MOUTH EVERYDAY AT BEDTIME   prednisoLONE acetate (PRED FORTE) 1 % ophthalmic suspension PLEASE SEE ATTACHED FOR DETAILED DIRECTIONS   rosuvastatin (CRESTOR) 10 MG tablet Take 1 tablet (10 mg total) by mouth daily.   simvastatin (ZOCOR) 80 MG tablet Take 1 tablet (80 mg total) by mouth daily.   tiZANidine (ZANAFLEX) 2 MG tablet Limit  2-4 tablets by mouth per day tolerated (Patient taking differently: Take 2 mg by mouth once. Limit  2-4 tablets by mouth per day tolerated/ Dr Metta Clines)   topiramate (TOPAMAX) 50 MG tablet Take 1 tablet (50 mg total) by mouth 2 (two) times daily.   tretinoin (RETIN-A) 0.05 % cream Apply 1 Application topically at bedtime.    Vitamin D, Ergocalciferol, (DRISDOL) 1.25 MG (50000 UNIT) CAPS capsule Take 1 capsule every 7 days.   Current Facility-Administered Medications for the 09/29/23 encounter (Office Visit) with Duanne Limerick, MD  Medication   bupivacaine (MARCAINE) 0.5 % injection 30 mL   bupivacaine (PF) (MARCAINE) 0.25 % injection 30 mL   bupivacaine (PF) (MARCAINE) 0.25 % injection 30 mL   fentaNYL (SUBLIMAZE) injection 100 mcg   fentaNYL (SUBLIMAZE) injection 100 mcg   fentaNYL (SUBLIMAZE) injection 100 mcg   lactated ringers infusion 1,000 mL   lactated ringers infusion 1,000 mL   lactated ringers infusion 1,000 mL   lactated ringers infusion 1,000 mL   lidocaine (PF) (XYLOCAINE) 1 % injection 10 mL   midazolam (VERSED) 5 MG/5ML injection 5 mg   midazolam (VERSED) 5 MG/5ML injection 5 mg   midazolam (VERSED) 5 MG/5ML injection 5 mg   orphenadrine (NORFLEX) injection 60 mg   orphenadrine (NORFLEX) injection 60 mg   orphenadrine (NORFLEX) injection 60 mg   sodium chloride 0.9 % injection 20 mL   triamcinolone acetonide (KENALOG-40) injection 40 mg   triamcinolone acetonide (KENALOG-40) injection 40 mg   triamcinolone acetonide (KENALOG-40) injection 40 mg       07/18/2023    2:06 PM 06/27/2023    3:34 PM 08/24/2022    2:34 PM 06/09/2022   11:02 AM  GAD 7 : Generalized Anxiety Score  Nervous, Anxious, on Edge 0 1 0 0  Control/stop worrying 0 1 0 0  Worry too much - different things 0 1 0 0  Trouble relaxing 0 1 0 0  Restless 0 1 0 0  Easily annoyed or irritable 0 2 0 0  Afraid - awful might happen 0 1 0 0  Total GAD 7 Score 0 8 0 0  Anxiety Difficulty Not difficult at all Not difficult at all Not difficult at all Not difficult at all       09/29/2023    3:11 PM 07/18/2023    2:06 PM 06/27/2023    3:33 PM  Depression screen PHQ 2/9  Decreased Interest 0 0 1  Down, Depressed, Hopeless 0 0 1  PHQ - 2 Score 0 0 2  Altered sleeping  0 1  Tired, decreased energy  0 2  Change in appetite   0 0  Feeling bad or failure  about yourself   0 1  Trouble concentrating  0 2  Moving slowly or fidgety/restless  0 0  Suicidal thoughts  0 0  PHQ-9 Score  0 8  Difficult doing work/chores  Not difficult at all Somewhat difficult    BP Readings from Last 3 Encounters:  09/29/23 (!) 162/102  07/18/23 120/72  06/27/23 130/80    Physical Exam Vitals and nursing note reviewed.  Constitutional:      General: She is not in acute distress.    Appearance: She is not diaphoretic.  HENT:     Head: Normocephalic and atraumatic.     Right Ear: External ear normal.     Left Ear: External ear normal.     Nose: Nose normal.  Eyes:     General:        Right eye: No discharge.        Left eye: No discharge.     Extraocular Movements:     Right eye: Normal extraocular motion.     Left eye: Normal extraocular motion.     Conjunctiva/sclera: Conjunctivae normal.     Pupils: Pupils are equal, round, and reactive to light.  Neck:     Thyroid: No thyromegaly.     Vascular: No JVD.  Cardiovascular:     Rate and Rhythm: Normal rate and regular rhythm.     Heart sounds: Normal heart sounds. No murmur heard.    No friction rub. No gallop.  Pulmonary:     Effort: Pulmonary effort is normal.     Breath sounds: Normal breath sounds. No wheezing, rhonchi or rales.  Chest:     Chest wall: No tenderness.  Abdominal:     General: Bowel sounds are normal. There is no distension.     Palpations: Abdomen is soft. There is no mass.     Tenderness: There is no abdominal tenderness. There is no guarding.  Musculoskeletal:     Cervical back: Normal range of motion and neck supple.  Lymphadenopathy:     Cervical: No cervical adenopathy.  Skin:    General: Skin is warm.  Neurological:     Mental Status: She is alert.     Coordination: Romberg sign negative.     Deep Tendon Reflexes: Reflexes are normal and symmetric.     Reflex Scores:      Patellar reflexes are 2+ on the right side and 2+ on the  left side.    Wt Readings from Last 3 Encounters:  09/29/23 198 lb 1.6 oz (89.9 kg)  07/18/23 191 lb 9.6 oz (86.9 kg)  06/27/23 187 lb (84.8 kg)    BP (!) 162/102   Pulse 72   Resp 16   Ht 5\' 10"  (1.778 m)   Wt 198 lb 1.6 oz (89.9 kg)   BMI 28.42 kg/m   Assessment and Plan:  1. Essential hypertension (Primary) Chronic.  Uncontrolled.  Relatively stable.  Patient has been taking her metoprolol XL 25 mg twice a day and losartan 25 mg once a day and feels better since starting the losartan however blood pressure is not under control adequately.  Blood pressures is noted to be 158/88.  Asymptomatic except for a chronic headache that patient has had in the neck area which is probably secondary to significant cervical degenerative disease.  Tolerating current medications well.  Patient has not picked up her 25 mg XL's so we will continue this and 50 mg XL once a day and increase her losartan from  25 to 50 mg once a day as well she will take these together and I have given her 30 if he we will recheck her blood pressure looks either in the latter part of the - losartan (COZAAR) 50 MG tablet; Take 1 tablet (50 mg total) by mouth daily.  Dispense: 30 tablet; Refill: 0 - metoprolol succinate (TOPROL-XL) 50 MG 24 hr tablet; Take 1 tablet (50 mg total) by mouth daily. Take with or immediately following a meal.  Dispense: 30 tablet; Refill: 0    Elizabeth Sauer, MD

## 2023-10-14 ENCOUNTER — Ambulatory Visit: Admitting: Neurology

## 2023-10-14 DIAGNOSIS — R202 Paresthesia of skin: Secondary | ICD-10-CM | POA: Diagnosis not present

## 2023-10-14 DIAGNOSIS — G5623 Lesion of ulnar nerve, bilateral upper limbs: Secondary | ICD-10-CM

## 2023-10-14 NOTE — Procedures (Signed)
 [facility]  [address] [place], [address]  [place], [place] [address] Tel: [phone] Fax: [phone] Test Date:  10/14/2023  Patient: Jamie Kim DOB: [birth year] Physician: Nita Sickle, DO  Sex: Female Height: 5\' 10"  Ref Phys: Marikay Alar, MD  ID#: [id number]   Technician:    History: This is a 63 year old female with history of cervical surgery referred for evaluation of bilateral hand paresthesias.  NCV & EMG Findings: Extensive electrodiagnostic testing of the right upper extremity and additional studies of the left shows:  Bilateral median and mixed palmar sensory responses are within normal limits.  Bilateral ulnar motor responses show mildly prolonged latency (R3.3, L3.5 ms). Bilateral median motor responses are within normal limits.  Bilateral ulnar motor responses show slowed conduction velocity across the elbow (A Elbow-B Elbow, L43, R43 m/s). Mild chronic motor axon loss changes are seen affecting the ulnar innervated muscles bilaterally, without accompanying active denervation.  Impression: Bilateral ulnar neuropathy with slowing across the elbow, demyelinating, mild. There is no evidence of carpal tunnel syndrome or cervical radiculopathy affecting the upper extremities.   ___________________________ Nita Sickle, DO    Nerve Conduction Studies   Stim Site NR Peak (ms) Norm Peak (ms) O-P Amp (V) Norm O-P Amp  Left Median Anti Sensory (2nd Digit)  32 C  Wrist    3.7 <3.8 23.8 >10  Right Median Anti Sensory (2nd Digit)  32 C  Wrist    3.6 <3.8 24.5 >10  Left Ulnar Anti Sensory (5th Digit)  32 C  Wrist    *3.5 <3.2 17.2 >5  Right Ulnar Anti Sensory (5th Digit)  32 C  Wrist    *3.3 <3.2 16.9 >5     Stim Site NR Onset (ms) Norm Onset (ms) O-P Amp (mV) Norm O-P Amp Site1 Site2 Delta-0 (ms) Dist (cm) Vel (m/s) Norm Vel (m/s)  Left Median Motor (Abd Poll Brev)  32 C  Wrist    3.8 <4.0 8.4 >5 Elbow Wrist 6.2 32.0 52 >50  Elbow    10.0  7.7          Right Median Motor (Abd Poll Brev)  32 C  Wrist    3.3 <4.0 9.5 >5 Elbow Wrist 5.6 32.0 57 >50  Elbow    8.9  9.4         Left Ulnar Motor (Abd Dig Minimi)  32 C  Wrist    3.0 <3.1 8.2 >7 B Elbow Wrist 4.1 23.0 56 >50  B Elbow    7.1  6.6  A Elbow B Elbow 2.3 10.0 *43 >50  A Elbow    9.4  6.0         Right Ulnar Motor (Abd Dig Minimi)  32 C  Wrist    3.0 <3.1 8.5 >7 B Elbow Wrist 4.2 22.0 52 >50  B Elbow    7.2  7.2  A Elbow B Elbow 2.3 10.0 *43 >50  A Elbow    9.5  6.2            Stim Site NR Peak (ms) Norm Peak (ms) P-T Amp (V) Site1 Site2 Delta-P (ms) Norm Delta (ms)  Left Median/Ulnar Palm Comparison (Wrist - 8cm)  32 C  Median Palm    1.9 <2.2 54.4 Median Palm Ulnar Palm 0.2   Ulnar Palm    2.1 <2.2 9.4      Right Median/Ulnar Palm Comparison (Wrist - 8cm)  32 C  Median Palm    1.9 <2.2  80.0 Median Palm Ulnar Palm 0.0   Ulnar Palm    1.9 <2.2 8.6       Electromyography   Side Muscle Ins.Act Fibs Fasc Recrt Amp Dur Poly Activation Comment  Right 1stDorInt Nml Nml Nml Nml Nml Nml Nml Nml N/A  Right PronatorTeres Nml Nml Nml Nml Nml Nml Nml Nml N/A  Right Biceps Nml Nml Nml Nml Nml Nml Nml Nml N/A  Right Triceps Nml Nml Nml Nml Nml Nml Nml Nml N/A  Right Deltoid Nml Nml Nml Nml Nml Nml Nml Nml N/A  Right Abd Dig Min Nml Nml Nml *1- *1+ *1+ *1+ Nml N/A  Right FlexCarpiUln Nml Nml Nml *1- *1+ *1+ *1+ Nml N/A  Left 1stDorInt Nml Nml Nml Nml Nml Nml Nml Nml N/A  Left PronatorTeres Nml Nml Nml Nml Nml Nml Nml Nml N/A  Left Biceps Nml Nml Nml Nml Nml Nml Nml Nml N/A  Left Triceps Nml Nml Nml Nml Nml Nml Nml Nml N/A  Left Deltoid Nml Nml Nml Nml Nml Nml Nml Nml N/A  Left Abd Dig Min Nml Nml Nml *1- *1+ *1+ *1+ Nml N/A  Left FlexCarpiUln Nml Nml Nml *1- *1+ *1+ *1+ Nml N/A     Waveforms:

## 2023-10-20 ENCOUNTER — Encounter: Admitting: Neurology

## 2023-10-21 ENCOUNTER — Ambulatory Visit: Admitting: Family Medicine

## 2023-10-22 ENCOUNTER — Other Ambulatory Visit: Payer: Self-pay | Admitting: Family Medicine

## 2023-10-22 DIAGNOSIS — I10 Essential (primary) hypertension: Secondary | ICD-10-CM

## 2023-10-24 DIAGNOSIS — H6983 Other specified disorders of Eustachian tube, bilateral: Secondary | ICD-10-CM | POA: Diagnosis not present

## 2023-10-24 DIAGNOSIS — H9042 Sensorineural hearing loss, unilateral, left ear, with unrestricted hearing on the contralateral side: Secondary | ICD-10-CM | POA: Diagnosis not present

## 2023-10-24 DIAGNOSIS — K219 Gastro-esophageal reflux disease without esophagitis: Secondary | ICD-10-CM | POA: Diagnosis not present

## 2023-10-24 DIAGNOSIS — H9313 Tinnitus, bilateral: Secondary | ICD-10-CM | POA: Diagnosis not present

## 2023-10-27 DIAGNOSIS — M5412 Radiculopathy, cervical region: Secondary | ICD-10-CM | POA: Diagnosis not present

## 2023-10-27 DIAGNOSIS — M5416 Radiculopathy, lumbar region: Secondary | ICD-10-CM | POA: Diagnosis not present

## 2023-10-27 DIAGNOSIS — M542 Cervicalgia: Secondary | ICD-10-CM | POA: Diagnosis not present

## 2023-10-27 DIAGNOSIS — M5459 Other low back pain: Secondary | ICD-10-CM | POA: Diagnosis not present

## 2023-10-27 DIAGNOSIS — Z5181 Encounter for therapeutic drug level monitoring: Secondary | ICD-10-CM | POA: Diagnosis not present

## 2023-10-27 DIAGNOSIS — M47892 Other spondylosis, cervical region: Secondary | ICD-10-CM | POA: Diagnosis not present

## 2023-10-27 DIAGNOSIS — Z79891 Long term (current) use of opiate analgesic: Secondary | ICD-10-CM | POA: Diagnosis not present

## 2023-10-27 DIAGNOSIS — G894 Chronic pain syndrome: Secondary | ICD-10-CM | POA: Diagnosis not present

## 2023-11-14 ENCOUNTER — Other Ambulatory Visit (HOSPITAL_COMMUNITY)

## 2023-11-15 NOTE — Progress Notes (Signed)
 Surgical Instructions   Your procedure is scheduled on Monday Nov 21, 2023. Report to [facility] Main Entrance "A" at 10:30 A.M., then check in with the Admitting office. Any questions or running late day of surgery: call [phone]  Questions prior to your surgery date: call [phone], Monday-Friday, 8am-4pm. If you experience any cold or flu symptoms such as cough, fever, chills, shortness of breath, etc. between now and your scheduled surgery, please notify us  at the above number.     Remember:  Do not eat after midnight the night before your surgery  You may drink clear liquids until 9:30 the morning of your surgery.   Clear liquids allowed are: Water , Non-Citrus Juices (without pulp), Carbonated Beverages, Clear Tea (no milk, honey, etc.), Black Coffee Only (NO MILK, CREAM OR POWDERED CREAMER of any kind), and Gatorade.    Take these medicines the morning of surgery with A SIP OF WATER   buPROPion  (WELLBUTRIN  XL)  busPIRone  (BUSPAR )  cycloSPORINE (RESTASIS) 0.05 % ophthalmic emulsion  esomeprazole  (NEXIUM )  ezetimibe  (ZETIA )  gabapentin  (NEURONTIN )  metoprolol  succinate (TOPROL -XL)  rosuvastatin  (CRESTOR )   May take these medicines IF NEEDED: HYDROcodone-acetaminophen  (NORCO)  nitroGLYCERIN (NITROSTAT) If you have to take this medication prior to surgery, please call [phone] and report this to a nurse tiZANidine  (ZANAFLEX )    One week prior to surgery, STOP taking any Aspirin (unless otherwise instructed by your surgeon) Aleve, Naproxen, Ibuprofen, Motrin, Advil, Goody's, BC's, all herbal medications, fish oil, and non-prescription vitamins.                     Do NOT Smoke (Tobacco/Vaping) for 24 hours prior to your procedure.  If you use a CPAP at night, you may bring your mask/headgear for your overnight stay.   You will be asked to remove any contacts, glasses, piercing's, hearing aid's, dentures/partials prior to surgery. Please bring cases for these items  if needed.    Patients discharged the day of surgery will not be allowed to drive home, and someone needs to stay with them for 24 hours.  SURGICAL WAITING ROOM VISITATION Patients may have no more than 2 support people in the waiting area - these visitors may rotate.   Pre-op nurse will coordinate an appropriate time for 1 ADULT support person, who may not rotate, to accompany patient in pre-op.  Children under the age of 52 must have an adult with them who is not the patient and must remain in the main waiting area with an adult.  If the patient needs to stay at the hospital during part of their recovery, the visitor guidelines for inpatient rooms apply.  Please refer to the [facility] website for the visitor guidelines for any additional information.   If you received a COVID test during your pre-op visit  it is requested that you wear a mask when out in public, stay away from anyone that may not be feeling well and notify your surgeon if you develop symptoms. If you have been in contact with anyone that has tested positive in the last 10 days please notify you surgeon.      Pre-operative 5 CHG Bathing Instructions   You can play a key role in reducing the risk of infection after surgery. Your skin needs to be as free of germs as possible. You can reduce the number of germs on your skin by washing with CHG (chlorhexidine gluconate) soap before surgery. CHG is an antiseptic soap that kills germs and continues to  kill germs even after washing.   DO NOT use if you have an allergy to chlorhexidine/CHG or antibacterial soaps. If your skin becomes reddened or irritated, stop using the CHG and notify one of our RNs at [phone].   Please shower with the CHG soap starting 4 days before surgery using the following schedule:     Please keep in mind the following:  DO NOT shave, including legs and underarms, starting the day of your first shower.   You may shave your face at any point  before/day of surgery.  Place clean sheets on your bed the day you start using CHG soap. Use a clean washcloth (not used since being washed) for each shower. DO NOT sleep with pets once you start using the CHG.   CHG Shower Instructions:  Wash your face and private area with normal soap. If you choose to wash your hair, wash first with your normal shampoo.  After you use shampoo/soap, rinse your hair and body thoroughly to remove shampoo/soap residue.  Turn the water  OFF and apply about 3 tablespoons (45 ml) of CHG soap to a CLEAN washcloth.  Apply CHG soap ONLY FROM YOUR NECK DOWN TO YOUR TOES (washing for 3-5 minutes)  DO NOT use CHG soap on face, private areas, open wounds, or sores.  Pay special attention to the area where your surgery is being performed.  If you are having back surgery, having someone wash your back for you may be helpful. Wait 2 minutes after CHG soap is applied, then you may rinse off the CHG soap.  Pat dry with a clean towel  Put on clean clothes/pajamas   If you choose to wear lotion, please use ONLY the CHG-compatible lotions that are listed below.  Additional instructions for the day of surgery: DO NOT APPLY any lotions, deodorants, cologne, or perfumes.   Do not bring valuables to the hospital. [facility] is not responsible for any belongings/valuables. Do not wear nail polish, gel polish, artificial nails, or any other type of covering on natural nails (fingers and toes) Do not wear jewelry or makeup Put on clean/comfortable clothes.  Please brush your teeth.  Ask your nurse before applying any prescription medications to the skin.     CHG Compatible Lotions   Aveeno Moisturizing lotion  Cetaphil Moisturizing Cream  Cetaphil Moisturizing Lotion  Clairol Herbal Essence Moisturizing Lotion, Dry Skin  Clairol Herbal Essence Moisturizing Lotion, Extra Dry Skin  Clairol Herbal Essence Moisturizing Lotion, Normal Skin  Curel Age Defying Therapeutic  Moisturizing Lotion with Alpha Hydroxy  Curel Extreme Care Body Lotion  Curel Soothing Hands Moisturizing Hand Lotion  Curel Therapeutic Moisturizing Cream, Fragrance-Free  Curel Therapeutic Moisturizing Lotion, Fragrance-Free  Curel Therapeutic Moisturizing Lotion, Original Formula  Eucerin Daily Replenishing Lotion  Eucerin Dry Skin Therapy Plus Alpha Hydroxy Crme  Eucerin Dry Skin Therapy Plus Alpha Hydroxy Lotion  Eucerin Original Crme  Eucerin Original Lotion  Eucerin Plus Crme Eucerin Plus Lotion  Eucerin TriLipid Replenishing Lotion  Keri Anti-Bacterial Hand Lotion  Keri Deep Conditioning Original Lotion Dry Skin Formula Softly Scented  Keri Deep Conditioning Original Lotion, Fragrance Free Sensitive Skin Formula  Keri Lotion Fast Absorbing Fragrance Free Sensitive Skin Formula  Keri Lotion Fast Absorbing Softly Scented Dry Skin Formula  Keri Original Lotion  Keri Skin Renewal Lotion Keri Silky Smooth Lotion  Keri Silky Smooth Sensitive Skin Lotion  Nivea Body Creamy Conditioning Oil  Nivea Body Extra Enriched Building services engineer  Sheer Moisturizing Lotion Nivea Crme  Nivea Skin Firming Lotion  NutraDerm 30 Skin Lotion  NutraDerm Skin Lotion  NutraDerm Therapeutic Skin Cream  NutraDerm Therapeutic Skin Lotion  ProShield Protective Hand Cream  Provon moisturizing lotion  Please read over the following fact sheets that you were given.

## 2023-11-16 ENCOUNTER — Other Ambulatory Visit: Payer: Self-pay

## 2023-11-16 ENCOUNTER — Encounter (HOSPITAL_COMMUNITY): Payer: Self-pay

## 2023-11-16 ENCOUNTER — Other Ambulatory Visit: Payer: Self-pay | Admitting: Neurological Surgery

## 2023-11-16 ENCOUNTER — Encounter (HOSPITAL_COMMUNITY)
Admission: RE | Admit: 2023-11-16 | Discharge: 2023-11-16 | Disposition: A | Discharge status: Home / Self Care | Source: Ambulatory Visit | Attending: Neurological Surgery | Admitting: Neurological Surgery

## 2023-11-16 VITALS — BP 144/97 | HR 96 | Temp 97.8°F | Resp 17 | Ht 70.0 in | Wt 187.9 lb

## 2023-11-16 DIAGNOSIS — G4733 Obstructive sleep apnea (adult) (pediatric): Secondary | ICD-10-CM | POA: Insufficient documentation

## 2023-11-16 DIAGNOSIS — Z01812 Encounter for preprocedural laboratory examination: Secondary | ICD-10-CM | POA: Diagnosis not present

## 2023-11-16 DIAGNOSIS — Z87891 Personal history of nicotine dependence: Secondary | ICD-10-CM | POA: Insufficient documentation

## 2023-11-16 DIAGNOSIS — G8929 Other chronic pain: Secondary | ICD-10-CM | POA: Diagnosis not present

## 2023-11-16 DIAGNOSIS — I10 Essential (primary) hypertension: Secondary | ICD-10-CM | POA: Diagnosis not present

## 2023-11-16 DIAGNOSIS — Z01818 Encounter for other preprocedural examination: Secondary | ICD-10-CM | POA: Diagnosis present

## 2023-11-16 DIAGNOSIS — R519 Headache, unspecified: Secondary | ICD-10-CM | POA: Diagnosis not present

## 2023-11-16 DIAGNOSIS — K219 Gastro-esophageal reflux disease without esophagitis: Secondary | ICD-10-CM | POA: Diagnosis not present

## 2023-11-16 DIAGNOSIS — E785 Hyperlipidemia, unspecified: Secondary | ICD-10-CM | POA: Diagnosis not present

## 2023-11-16 LAB — BASIC METABOLIC PANEL WITH GFR
Anion gap: 8 (ref 5–15)
BUN: 14 mg/dL (ref 8–23)
CO2: 25 mmol/L (ref 22–32)
Calcium: 10 mg/dL (ref 8.9–10.3)
Chloride: 103 mmol/L (ref 98–111)
Creatinine, Ser: 0.98 mg/dL (ref 0.44–1.00)
GFR, Estimated: >60 mL/min (ref >60)
Glucose, Bld: 121 mg/dL — ABNORMAL HIGH (ref 70–99)
Potassium: 4.3 mmol/L (ref 3.5–5.1)
Sodium: 136 mmol/L (ref 135–145)

## 2023-11-16 LAB — CBC
HCT: 38.6 % (ref 36.0–46.0)
Hemoglobin: 13 g/dL (ref 12.0–15.0)
MCH: 30.7 pg (ref 26.0–34.0)
MCHC: 33.7 g/dL (ref 30.0–36.0)
MCV: 91.3 fL (ref 80.0–100.0)
Platelets: 346 10*3/uL (ref 150–400)
RBC: 4.23 MIL/uL (ref 3.87–5.11)
RDW: 12.8 % (ref 11.5–15.5)
WBC: 5.3 10*3/uL (ref 4.0–10.5)
nRBC: 0 % (ref 0.0–0.2)

## 2023-11-16 LAB — SURGICAL PCR SCREEN
MRSA, PCR: NEGATIVE
Staphylococcus aureus: POSITIVE — AB

## 2023-11-16 NOTE — Progress Notes (Signed)
 PCP - Clarise Crooks, MD  Cardiologist - [facility] Dr. Alissa Irving  Neurology Daryel Ensign, DO     PPM/ICD - denies Device Orders - n/a Rep Notified - n/a  Chest x-ray - 05-01-23 EKG - 05-02-23 Stress Test - 09-10-22 ECHO - 12-20-22 Cardiac Cath - 11-23-12  Sleep Study - per patient many years ago mild CPAP - NO CPAP  DM -denies  Blood Thinner Instructions: denies Aspirin Instructions: n/a  ERAS Protcol -Clear liquid 9:30   COVID TEST- n/a   Anesthesia review: Yes, HTN, heart murmur, OSA, hx of heart cath  Patient denies shortness of breath, fever, cough and chest pain at PAT appointment   All instructions explained to the patient, with a verbal understanding of the material. Patient agrees to go over the instructions while at home for a better understanding. Patient also instructed to self quarantine after being tested for COVID-19. The opportunity to ask questions was provided.

## 2023-11-17 NOTE — Progress Notes (Signed)
 Anesthesia Chart Review:  63 year old female follows with cardiology at [facility] for management of HTN and HLD.  She was evaluated in February 2024 for atypical chest pain.  Stress test 09/10/2022 showed normal myocardial perfusion.  Echo 12/20/2022 showed EF >55%, grade 1 DD, severe basal septal hypertrophy (~2.1 cm) with SAM without evidence significant outflow gradient at rest (peak outflow gradient 8 mmHg, not assessed with Valsalva), no significant valvular abnormalities.  Patient did not have subsequent follow-up with cardiology; she reports she was advised to follow-up as needed.  Other pertinent history includes former smoker (12.5 pack years, quit 2018), PONV, GERD on PPI, OSA (reportedly mild, no CPAP), chronic headaches, chronic pain.  Chronic conditions managed by her PCP Dr. Alayne Allis.  Last seen 09/29/2023 for follow-up of hypertension.  She is continued on Toprol  XL 50 mg once daily and her losartan  was increased from 25 mg to 50 mg once daily.  Preop labs reviewed, WNL.  History reviewed with anesthesiologist Dr. Gertie Kub was.  He advised patient okay to proceed as planned barring acute status change.   EKG 05/01/2023: Normal sinus rhythm.  Rate 83. 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 Anterolateral infarct (cited on or before 01-Jul-2012)  TTE 12/20/2022: Summary   1. The left ventricle is normal in size with mild concentric hypertrophy and  severe basal septal hypertrophy (~2.1 cm), concerning for hypertrophic  cardiomyopathy (see details below).    2. There is systolic anterior motion of the mitral valve and turbulent flow  in the LVOT without evidence of significant outflow gradient at rest (Valsalva  not performed).    3. The left ventricular systolic function is normal, LVEF is visually  estimated at > 55%.    4. There is grade I diastolic dysfunction (impaired relaxation).    5. The right ventricle is normal in size, with normal systolic function.    Left  Ventricle    The left ventricle is normal in size with mild concentric hypertrophy and  severe basal septal hypertrophy (~2.1 cm), concerning for hypertrophic  cardiomyopathy (see details below). The left ventricular systolic function is  normal, LVEF is visually estimated at > 55%. There is grade I diastolic  dysfunction (impaired relaxation). There is systolic anterior motion of the  mitral valve and turbulent flow in the LVOT without evidence of significant  outflow gradient at rest (Valsalva not performed). Peak outflow gradient 8  mmHg at rest; not assessed with Valsalva. LV global longitudinal strain: -16.3  %; severely decreased strain in the basal septum.   Nuclear stress 09/10/2022: - Normal myocardial perfusion study  - No evidence of any significant ischemia or scar  - Left ventricular systolic function is normal. Post stress the ejection  fraction is > 60%.  - No significant coronary calcifications were noted on the attenuation CT     Edilia Gordon [facility] Short Stay Center/Anesthesiology Phone [phone] 11/17/2023 10:20 AM

## 2023-11-17 NOTE — Anesthesia Preprocedure Evaluation (Addendum)
 Anesthesia Evaluation  Patient identified by MRN, date of birth, ID band Patient awake    Reviewed: Allergy & Precautions, NPO status , Patient's Chart, lab work & pertinent test results, reviewed documented beta blocker date and time   History of Anesthesia Complications (+) PONV and history of anesthetic complications  Airway Mallampati: III  TM Distance: <3 FB Neck ROM: Limited    Dental  (+) Dental Advisory Given, Upper Dentures   Pulmonary sleep apnea , Current SmokerPatient did not abstain from smoking., former smoker   Pulmonary exam normal breath sounds clear to auscultation       Cardiovascular hypertension, Pt. on home beta blockers and Pt. on medications Normal cardiovascular exam Rhythm:Regular Rate:Normal     Neuro/Psych  Headaches PSYCHIATRIC DISORDERS Anxiety Depression     Neuromuscular disease    GI/Hepatic Neg liver ROS,GERD  Medicated,,  Endo/Other  negative endocrine ROS    Renal/GU negative Renal ROS     Musculoskeletal  (+)  Fibromyalgia -  Abdominal   Peds  Hematology negative hematology ROS (+)   Anesthesia Other Findings Day of surgery medications reviewed with the patient.  Reproductive/Obstetrics                             Anesthesia Physical Anesthesia Plan  ASA: 3  Anesthesia Plan: General   Post-op Pain Management: Tylenol  PO (pre-op)*   Induction: Intravenous  PONV Risk Score and Plan: 3 and Scopolamine  patch - Pre-op, Midazolam , TIVA, Dexamethasone  and Ondansetron   Airway Management Planned: Oral ETT and Video Laryngoscope Planned  Additional Equipment: Arterial line  Intra-op Plan:   Post-operative Plan: Extubation in OR  Informed Consent: I have reviewed the patients History and Physical, chart, labs and discussed the procedure including the risks, benefits and alternatives for the proposed anesthesia with the patient or authorized  representative who has indicated his/her understanding and acceptance.     Dental advisory given  Plan Discussed with: CRNA  Anesthesia Plan Comments: (PAT note by Rudy Costain, PA-C:  63 year old female follows with cardiology at [facility] for management of HTN and HLD.  She was evaluated in February 2024 for atypical chest pain.  Stress test 09/10/2022 showed normal myocardial perfusion.  Echo 12/20/2022 showed EF >55%, grade 1 DD, severe basal septal hypertrophy (~2.1 cm) with SAM without evidence significant outflow gradient at rest (peak outflow gradient 8 mmHg, not assessed with Valsalva), no significant valvular abnormalities.  Patient did not have subsequent follow-up with cardiology; she reports she was advised to follow-up as needed.  Other pertinent history includes former smoker (12.5 pack years, quit 2018), PONV, GERD on PPI, OSA (reportedly mild, no CPAP), chronic headaches, chronic pain.  Chronic conditions managed by her PCP Dr. Alayne Allis.  Last seen 09/29/2023 for follow-up of hypertension.  She is continued on Toprol  XL 50 mg once daily and her losartan  was increased from 25 mg to 50 mg once daily.  Preop labs reviewed, WNL.  History reviewed with anesthesiologist Dr. Gertie Kub was.  He advised patient okay to proceed as planned barring acute status change.   EKG 05/01/2023: Normal sinus rhythm.  Rate 83. 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 Anterolateral infarct (cited on or before 01-Jul-2012)  TTE 12/20/2022: Summary  1. The left ventricle is normal in size with mild concentric hypertrophy and  severe basal septal hypertrophy (~2.1 cm), concerning for hypertrophic  cardiomyopathy (see details below).  2. There is systolic anterior motion of the  mitral valve and turbulent flow  in the LVOT without evidence of significant outflow gradient at rest (Valsalva  not performed).  3. The left ventricular systolic function is normal, LVEF is visually  estimated at >  55%.  4. There is grade I diastolicdysfunction (impaired relaxation).  5. The right ventricle is normal in size, with normal systolic function.    Left Ventricle  The left ventricle is normal in size with mild concentric hypertrophy and  severe basal septal hypertrophy (~2.1 cm), concerning for hypertrophic  cardiomyopathy (see details below). The left ventricular systolic function is  normal, LVEF is visually estimated at > 55%. There is grade I diastolic  dysfunction (impaired relaxation). There is systolic anterior motion of the  mitral valve and turbulent flow in the LVOT without evidence of significant  outflow gradient at rest (Valsalva not performed). Peak outflow gradient 8  mmHg at rest; not assessed with Valsalva. LV global longitudinal strain: -16.3  %; severely decreased strain in the basal septum.   Nuclear stress 09/10/2022: - Normal myocardial perfusion study  - No evidence of any significant ischemia or scar  - Left ventricular systolic functionis normal. Post stress the ejection  fraction is > 60%.  - No significant coronary calcifications were noted on the attenuation CT    )        Anesthesia Quick Evaluation

## 2023-11-18 NOTE — Progress Notes (Signed)
 left voicemail for patient with new arrival time of 1130 monday and clear liquids okay until 1030.

## 2023-11-21 ENCOUNTER — Ambulatory Visit (HOSPITAL_COMMUNITY)

## 2023-11-21 ENCOUNTER — Other Ambulatory Visit: Payer: Self-pay

## 2023-11-21 ENCOUNTER — Encounter (HOSPITAL_COMMUNITY): Payer: Self-pay | Admitting: Neurological Surgery

## 2023-11-21 ENCOUNTER — Observation Stay (HOSPITAL_COMMUNITY)
Admission: RE | Admit: 2023-11-21 | Discharge: 2023-11-22 | Disposition: A | Discharge status: Home / Self Care | Attending: Neurological Surgery | Admitting: Neurological Surgery

## 2023-11-21 ENCOUNTER — Encounter (HOSPITAL_COMMUNITY)
Admission: RE | Disposition: A | Discharge status: Home / Self Care | Payer: Self-pay | Source: Home / Self Care | Attending: Neurological Surgery

## 2023-11-21 ENCOUNTER — Ambulatory Visit (HOSPITAL_BASED_OUTPATIENT_CLINIC_OR_DEPARTMENT_OTHER)

## 2023-11-21 ENCOUNTER — Ambulatory Visit: Admitting: Family Medicine

## 2023-11-21 ENCOUNTER — Ambulatory Visit (HOSPITAL_COMMUNITY): Payer: Self-pay | Admitting: Physician Assistant

## 2023-11-21 DIAGNOSIS — Z87891 Personal history of nicotine dependence: Secondary | ICD-10-CM | POA: Diagnosis not present

## 2023-11-21 DIAGNOSIS — Z981 Arthrodesis status: Secondary | ICD-10-CM | POA: Diagnosis not present

## 2023-11-21 DIAGNOSIS — I10 Essential (primary) hypertension: Secondary | ICD-10-CM

## 2023-11-21 DIAGNOSIS — M5011 Cervical disc disorder with radiculopathy,  high cervical region: Secondary | ICD-10-CM | POA: Diagnosis not present

## 2023-11-21 DIAGNOSIS — M4802 Spinal stenosis, cervical region: Secondary | ICD-10-CM | POA: Diagnosis not present

## 2023-11-21 DIAGNOSIS — Z79899 Other long term (current) drug therapy: Secondary | ICD-10-CM | POA: Insufficient documentation

## 2023-11-21 DIAGNOSIS — M4722 Other spondylosis with radiculopathy, cervical region: Secondary | ICD-10-CM

## 2023-11-21 DIAGNOSIS — F17210 Nicotine dependence, cigarettes, uncomplicated: Secondary | ICD-10-CM | POA: Diagnosis not present

## 2023-11-21 DIAGNOSIS — M50121 Cervical disc disorder at C4-C5 level with radiculopathy: Secondary | ICD-10-CM | POA: Diagnosis not present

## 2023-11-21 DIAGNOSIS — G43C0 Periodic headache syndromes in child or adult, not intractable: Secondary | ICD-10-CM

## 2023-11-21 DIAGNOSIS — Z472 Encounter for removal of internal fixation device: Secondary | ICD-10-CM | POA: Diagnosis not present

## 2023-11-21 HISTORY — PX: ANTERIOR CERVICAL DECOMP/DISCECTOMY FUSION: SHX1161

## 2023-11-21 SURGERY — ANTERIOR CERVICAL DECOMPRESSION/DISCECTOMY FUSION 2 LEVELS
Anesthesia: General | Site: Spine Cervical

## 2023-11-21 MED ORDER — MENTHOL 3 MG MT LOZG: 1.0000 | LOZENGE | OROMUCOSAL | Status: DC | PRN

## 2023-11-21 MED ORDER — SENNA 8.6 MG PO TABS
1.0000 | ORAL_TABLET | Freq: Two times a day (BID) | ORAL | Status: DC
  Administered 2023-11-21 – 2023-11-22 (×2): 8.6 mg via ORAL
  Filled 2023-11-21 (×2): qty 1

## 2023-11-21 MED ORDER — 0.9 % SODIUM CHLORIDE (POUR BTL) OPTIME
TOPICAL | Status: DC | PRN
  Administered 2023-11-21: 1000 mL

## 2023-11-21 MED ORDER — ALBUMIN HUMAN 5 % IV SOLN: INTRAVENOUS | Status: DC | PRN

## 2023-11-21 MED ORDER — SUGAMMADEX SODIUM 200 MG/2ML IV SOLN
INTRAVENOUS | Status: DC | PRN
  Administered 2023-11-21: 335.6 mg via INTRAVENOUS

## 2023-11-21 MED ORDER — ACETAMINOPHEN 10 MG/ML IV SOLN
INTRAVENOUS | Status: AC
  Filled 2023-11-21: qty 100

## 2023-11-21 MED ORDER — MIDAZOLAM HCL 2 MG/2ML IJ SOLN
INTRAMUSCULAR | Status: AC
  Filled 2023-11-21: qty 2

## 2023-11-21 MED ORDER — TIZANIDINE HCL 4 MG PO TABS
2.0000 mg | ORAL_TABLET | Freq: Every day | ORAL | Status: DC | PRN
  Administered 2023-11-21 – 2023-11-22 (×2): 4 mg via ORAL
  Filled 2023-11-21 (×2): qty 1

## 2023-11-21 MED ORDER — BUPROPION HCL ER (XL) 150 MG PO TB24
150.0000 mg | ORAL_TABLET | Freq: Every day | ORAL | Status: DC
  Administered 2023-11-22: 150 mg via ORAL
  Filled 2023-11-21: qty 1

## 2023-11-21 MED ORDER — DEXMEDETOMIDINE HCL IN NACL 80 MCG/20ML IV SOLN
INTRAVENOUS | Status: DC | PRN
  Administered 2023-11-21: 20 ug via INTRAVENOUS

## 2023-11-21 MED ORDER — ROCURONIUM BROMIDE 10 MG/ML (PF) SYRINGE
PREFILLED_SYRINGE | INTRAVENOUS | Status: AC
  Filled 2023-11-21: qty 10

## 2023-11-21 MED ORDER — PHENYLEPHRINE 80 MCG/ML (10ML) SYRINGE FOR IV PUSH (FOR BLOOD PRESSURE SUPPORT)
PREFILLED_SYRINGE | INTRAVENOUS | Status: AC
  Filled 2023-11-21: qty 10

## 2023-11-21 MED ORDER — CHLORHEXIDINE GLUCONATE 0.12 % MT SOLN
15.0000 mL | Freq: Once | OROMUCOSAL | Status: DC
  Filled 2023-11-21: qty 15

## 2023-11-21 MED ORDER — THROMBIN 5000 UNITS EX SOLR
OROMUCOSAL | Status: DC | PRN
  Administered 2023-11-21: 5 mL via TOPICAL

## 2023-11-21 MED ORDER — FENTANYL CITRATE (PF) 250 MCG/5ML IJ SOLN
INTRAMUSCULAR | Status: DC | PRN
  Administered 2023-11-21: 25 ug via INTRAVENOUS
  Administered 2023-11-21: 100 ug via INTRAVENOUS
  Administered 2023-11-21 (×2): 25 ug via INTRAVENOUS
  Administered 2023-11-21: 50 ug via INTRAVENOUS
  Administered 2023-11-21: 25 ug via INTRAVENOUS

## 2023-11-21 MED ORDER — DEXAMETHASONE SODIUM PHOSPHATE 10 MG/ML IJ SOLN
INTRAMUSCULAR | Status: AC
  Filled 2023-11-21: qty 1

## 2023-11-21 MED ORDER — CEFAZOLIN SODIUM-DEXTROSE 2-4 GM/100ML-% IV SOLN
2.0000 g | INTRAVENOUS | Status: AC
  Administered 2023-11-21: 2 g via INTRAVENOUS
  Filled 2023-11-21: qty 100

## 2023-11-21 MED ORDER — MIDAZOLAM HCL 2 MG/2ML IJ SOLN
INTRAMUSCULAR | Status: DC | PRN
  Administered 2023-11-21: 2 mg via INTRAVENOUS

## 2023-11-21 MED ORDER — THROMBIN 5000 UNITS EX KIT
PACK | CUTANEOUS | Status: AC
  Filled 2023-11-21: qty 1

## 2023-11-21 MED ORDER — HYDROCODONE-ACETAMINOPHEN 10-325 MG PO TABS
1.0000 | ORAL_TABLET | ORAL | Status: DC | PRN
  Administered 2023-11-21 – 2023-11-22 (×2): 1 via ORAL
  Filled 2023-11-21 (×2): qty 1

## 2023-11-21 MED ORDER — FENTANYL CITRATE (PF) 100 MCG/2ML IJ SOLN: 25.0000 ug | INTRAMUSCULAR | Status: DC | PRN

## 2023-11-21 MED ORDER — GABAPENTIN 300 MG PO CAPS
600.0000 mg | ORAL_CAPSULE | Freq: Three times a day (TID) | ORAL | Status: DC
  Administered 2023-11-21 – 2023-11-22 (×2): 600 mg via ORAL
  Filled 2023-11-21 (×2): qty 2

## 2023-11-21 MED ORDER — HYDROCODONE-ACETAMINOPHEN 10-325 MG PO TABS
1.0000 | ORAL_TABLET | Freq: Four times a day (QID) | ORAL | Status: DC | PRN
  Administered 2023-11-21: 1 via ORAL
  Filled 2023-11-21: qty 1

## 2023-11-21 MED ORDER — DEXAMETHASONE SODIUM PHOSPHATE 10 MG/ML IJ SOLN
INTRAMUSCULAR | Status: DC | PRN
  Administered 2023-11-21: 10 mg via INTRAVENOUS

## 2023-11-21 MED ORDER — ROCURONIUM BROMIDE 10 MG/ML (PF) SYRINGE
PREFILLED_SYRINGE | INTRAVENOUS | Status: AC
Start: 2023-11-21 — End: ?
  Filled 2023-11-21: qty 10

## 2023-11-21 MED ORDER — GABAPENTIN 300 MG PO CAPS
300.0000 mg | ORAL_CAPSULE | Freq: Once | ORAL | Status: DC
  Filled 2023-11-21: qty 1

## 2023-11-21 MED ORDER — LIDOCAINE 2% (20 MG/ML) 5 ML SYRINGE
INTRAMUSCULAR | Status: AC
  Filled 2023-11-21: qty 5

## 2023-11-21 MED ORDER — LIDOCAINE 2% (20 MG/ML) 5 ML SYRINGE
INTRAMUSCULAR | Status: DC | PRN
  Administered 2023-11-21: 80 mg via INTRAVENOUS

## 2023-11-21 MED ORDER — CHLORHEXIDINE GLUCONATE CLOTH 2 % EX PADS: 6.0000 | MEDICATED_PAD | Freq: Once | CUTANEOUS | Status: DC

## 2023-11-21 MED ORDER — PHENOL 1.4 % MT LIQD: 1.0000 | OROMUCOSAL | Status: DC | PRN

## 2023-11-21 MED ORDER — CEFAZOLIN SODIUM-DEXTROSE 2-4 GM/100ML-% IV SOLN
2.0000 g | Freq: Three times a day (TID) | INTRAVENOUS | Status: AC
  Administered 2023-11-21 – 2023-11-22 (×2): 2 g via INTRAVENOUS
  Filled 2023-11-21 (×2): qty 100

## 2023-11-21 MED ORDER — METOPROLOL SUCCINATE ER 50 MG PO TB24
50.0000 mg | ORAL_TABLET | Freq: Every day | ORAL | Status: DC
  Administered 2023-11-22: 50 mg via ORAL
  Filled 2023-11-21: qty 1

## 2023-11-21 MED ORDER — ONDANSETRON HCL 4 MG/2ML IJ SOLN
INTRAMUSCULAR | Status: DC | PRN
  Administered 2023-11-21: 4 mg via INTRAVENOUS

## 2023-11-21 MED ORDER — ONDANSETRON HCL 4 MG/2ML IJ SOLN: 4.0000 mg | Freq: Once | INTRAMUSCULAR | Status: DC | PRN

## 2023-11-21 MED ORDER — EZETIMIBE 10 MG PO TABS
10.0000 mg | ORAL_TABLET | Freq: Every day | ORAL | Status: DC
  Administered 2023-11-21 – 2023-11-22 (×2): 10 mg via ORAL
  Filled 2023-11-21 (×2): qty 1

## 2023-11-21 MED ORDER — HYDROCODONE-ACETAMINOPHEN 5-325 MG PO TABS
ORAL_TABLET | ORAL | Status: AC
Start: 2023-11-21 — End: ?
  Filled 2023-11-21: qty 1

## 2023-11-21 MED ORDER — SODIUM CHLORIDE 0.9 % IV SOLN: 250.0000 mL | INTRAVENOUS | Status: DC

## 2023-11-21 MED ORDER — ONDANSETRON HCL 4 MG/2ML IJ SOLN: 4.0000 mg | Freq: Four times a day (QID) | INTRAMUSCULAR | Status: DC | PRN

## 2023-11-21 MED ORDER — ACETAMINOPHEN 650 MG RE SUPP: 650.0000 mg | RECTAL | Status: DC | PRN

## 2023-11-21 MED ORDER — PROPOFOL 1000 MG/100ML IV EMUL
INTRAVENOUS | Status: AC
  Filled 2023-11-21: qty 200

## 2023-11-21 MED ORDER — CYCLOSPORINE 0.05 % OP EMUL
1.0000 [drp] | Freq: Two times a day (BID) | OPHTHALMIC | Status: DC
  Administered 2023-11-21 – 2023-11-22 (×2): 1 [drp] via OPHTHALMIC
  Filled 2023-11-21: qty 1

## 2023-11-21 MED ORDER — BUPIVACAINE HCL (PF) 0.25 % IJ SOLN
INTRAMUSCULAR | Status: AC
  Filled 2023-11-21: qty 30

## 2023-11-21 MED ORDER — BUSPIRONE HCL 15 MG PO TABS
30.0000 mg | ORAL_TABLET | Freq: Two times a day (BID) | ORAL | Status: DC
  Administered 2023-11-21 – 2023-11-22 (×2): 30 mg via ORAL
  Filled 2023-11-21 (×2): qty 2

## 2023-11-21 MED ORDER — SODIUM CHLORIDE 0.9% FLUSH
3.0000 mL | Freq: Two times a day (BID) | INTRAVENOUS | Status: DC
  Administered 2023-11-21: 3 mL via INTRAVENOUS

## 2023-11-21 MED ORDER — PROPOFOL 10 MG/ML IV BOLUS
INTRAVENOUS | Status: AC
  Filled 2023-11-21: qty 20

## 2023-11-21 MED ORDER — SODIUM CHLORIDE 0.9% FLUSH: 3.0000 mL | INTRAVENOUS | Status: DC | PRN

## 2023-11-21 MED ORDER — HYDROMORPHONE HCL 1 MG/ML IJ SOLN
0.5000 mg | INTRAMUSCULAR | Status: DC | PRN
  Administered 2023-11-22: 0.5 mg via INTRAVENOUS
  Filled 2023-11-21: qty 0.5

## 2023-11-21 MED ORDER — FENTANYL CITRATE (PF) 250 MCG/5ML IJ SOLN
INTRAMUSCULAR | Status: AC
  Filled 2023-11-21: qty 5

## 2023-11-21 MED ORDER — LACTATED RINGERS IV SOLN: INTRAVENOUS | Status: DC | PRN

## 2023-11-21 MED ORDER — SCOPOLAMINE 1 MG/3DAYS TD PT72
1.0000 | MEDICATED_PATCH | TRANSDERMAL | Status: DC
  Administered 2023-11-21: 1.5 mg via TRANSDERMAL
  Filled 2023-11-21: qty 1

## 2023-11-21 MED ORDER — PROPOFOL 10 MG/ML IV BOLUS
INTRAVENOUS | Status: DC | PRN
  Administered 2023-11-21: 100 mg via INTRAVENOUS
  Administered 2023-11-21: 175 ug/kg/min via INTRAVENOUS
  Administered 2023-11-21: 50 mg via INTRAVENOUS

## 2023-11-21 MED ORDER — PHENYLEPHRINE HCL-NACL 20-0.9 MG/250ML-% IV SOLN
INTRAVENOUS | Status: DC | PRN
  Administered 2023-11-21: 30 ug/min via INTRAVENOUS

## 2023-11-21 MED ORDER — ORAL CARE MOUTH RINSE: 15.0000 mL | Freq: Once | OROMUCOSAL | Status: DC

## 2023-11-21 MED ORDER — ACETAMINOPHEN 325 MG PO TABS: 650.0000 mg | ORAL_TABLET | ORAL | Status: DC | PRN

## 2023-11-21 MED ORDER — ROCURONIUM BROMIDE 10 MG/ML (PF) SYRINGE
PREFILLED_SYRINGE | INTRAVENOUS | Status: DC | PRN
  Administered 2023-11-21: 40 mg via INTRAVENOUS
  Administered 2023-11-21: 10 mg via INTRAVENOUS
  Administered 2023-11-21: 60 mg via INTRAVENOUS

## 2023-11-21 MED ORDER — PANTOPRAZOLE SODIUM 40 MG PO TBEC
40.0000 mg | DELAYED_RELEASE_TABLET | Freq: Every day | ORAL | Status: DC
  Administered 2023-11-22: 40 mg via ORAL
  Filled 2023-11-21: qty 1

## 2023-11-21 MED ORDER — LOSARTAN POTASSIUM 50 MG PO TABS
50.0000 mg | ORAL_TABLET | Freq: Every day | ORAL | Status: DC
  Administered 2023-11-21 – 2023-11-22 (×2): 50 mg via ORAL
  Filled 2023-11-21 (×2): qty 1

## 2023-11-21 MED ORDER — ONDANSETRON HCL 4 MG/2ML IJ SOLN
INTRAMUSCULAR | Status: AC
  Filled 2023-11-21: qty 2

## 2023-11-21 MED ORDER — SODIUM CHLORIDE 0.9% FLUSH: 3.0000 mL | Freq: Two times a day (BID) | INTRAVENOUS | Status: DC

## 2023-11-21 MED ORDER — ONDANSETRON HCL 4 MG PO TABS
4.0000 mg | ORAL_TABLET | Freq: Four times a day (QID) | ORAL | Status: DC | PRN
  Administered 2023-11-22: 4 mg via ORAL
  Filled 2023-11-21: qty 1

## 2023-11-21 MED ORDER — SODIUM CHLORIDE 0.9% FLUSH
3.0000 mL | Freq: Two times a day (BID) | INTRAVENOUS | Status: DC
Start: 2023-11-21 — End: 2023-11-21

## 2023-11-21 MED ORDER — ACETAMINOPHEN 500 MG PO TABS
1000.0000 mg | ORAL_TABLET | Freq: Once | ORAL | Status: AC
  Administered 2023-11-21: 500 mg via ORAL
  Filled 2023-11-21: qty 2

## 2023-11-21 MED ORDER — MUPIROCIN 2 % EX OINT: 1.0000 | TOPICAL_OINTMENT | Freq: Two times a day (BID) | CUTANEOUS | 0 refills | Status: DC

## 2023-11-21 MED ORDER — ACETAMINOPHEN 10 MG/ML IV SOLN
500.0000 mg | Freq: Once | INTRAVENOUS | Status: AC
  Administered 2023-11-21: 500 mg via INTRAVENOUS

## 2023-11-21 MED ORDER — DEXAMETHASONE SODIUM PHOSPHATE 4 MG/ML IJ SOLN
4.0000 mg | Freq: Four times a day (QID) | INTRAMUSCULAR | Status: DC
  Administered 2023-11-21: 4 mg via INTRAVENOUS
  Filled 2023-11-21: qty 1

## 2023-11-21 MED ORDER — AMISULPRIDE (ANTIEMETIC) 5 MG/2ML IV SOLN: 10.0000 mg | Freq: Once | INTRAVENOUS | Status: DC | PRN

## 2023-11-21 MED ORDER — DEXAMETHASONE 4 MG PO TABS
4.0000 mg | ORAL_TABLET | Freq: Four times a day (QID) | ORAL | Status: DC
  Administered 2023-11-21 – 2023-11-22 (×2): 4 mg via ORAL
  Filled 2023-11-21 (×2): qty 1

## 2023-11-21 MED ORDER — CHLORHEXIDINE GLUCONATE 4 % EX SOLN: 1.0000 | CUTANEOUS | 1 refills | Status: DC

## 2023-11-21 MED ORDER — PHENYLEPHRINE 80 MCG/ML (10ML) SYRINGE FOR IV PUSH (FOR BLOOD PRESSURE SUPPORT)
PREFILLED_SYRINGE | INTRAVENOUS | Status: DC | PRN
  Administered 2023-11-21: 160 ug via INTRAVENOUS
  Administered 2023-11-21: 80 ug via INTRAVENOUS

## 2023-11-21 MED ORDER — DEXMEDETOMIDINE HCL IN NACL 80 MCG/20ML IV SOLN
INTRAVENOUS | Status: AC
  Filled 2023-11-21: qty 20

## 2023-11-21 MED ORDER — HYDROCODONE-ACETAMINOPHEN 5-325 MG PO TABS
1.0000 | ORAL_TABLET | Freq: Once | ORAL | Status: AC
  Administered 2023-11-21: 1 via ORAL

## 2023-11-21 MED ORDER — OXYBUTYNIN CHLORIDE ER 5 MG PO TB24
5.0000 mg | ORAL_TABLET | Freq: Every day | ORAL | Status: DC
  Administered 2023-11-21: 5 mg via ORAL
  Filled 2023-11-21 (×2): qty 1

## 2023-11-21 MED ORDER — BUPIVACAINE HCL (PF) 0.25 % IJ SOLN
INTRAMUSCULAR | Status: DC | PRN
  Administered 2023-11-21: 5 mL

## 2023-11-21 MED ORDER — OXYCODONE HCL 5 MG PO TABS
ORAL_TABLET | ORAL | Status: AC
  Filled 2023-11-21: qty 1

## 2023-11-21 SURGICAL SUPPLY — 50 items
BAG COUNTER SPONGE SURGICOUNT (BAG) ×2 IMPLANT
BAND RUBBER #18 3X1/16 STRL (MISCELLANEOUS) ×4 IMPLANT
BASKET BONE COLLECTION (BASKET) ×2 IMPLANT
BENZOIN TINCTURE PRP APPL 2/3 (GAUZE/BANDAGES/DRESSINGS) ×2 IMPLANT
BUR CARBIDE MATCH 3.0 (BURR) ×2 IMPLANT
BUR CROSS CUT FISSURE 1.2 (BURR) IMPLANT
CANISTER SUCTION 3000ML PPV (SUCTIONS) ×2 IMPLANT
DERMABOND ADVANCED .7 DNX12 (GAUZE/BANDAGES/DRESSINGS) IMPLANT
DRAPE C-ARM 42X72 X-RAY (DRAPES) ×4 IMPLANT
DRAPE LAPAROTOMY 100X72 PEDS (DRAPES) ×2 IMPLANT
DRAPE MICROSCOPE SLANT 54X150 (MISCELLANEOUS) IMPLANT
DRSG OPSITE POSTOP 3X4 (GAUZE/BANDAGES/DRESSINGS) IMPLANT
DURAPREP 6ML APPLICATOR 50/CS (WOUND CARE) ×2 IMPLANT
ELECT COATED BLADE 2.86 ST (ELECTRODE) ×2 IMPLANT
ELECTRODE REM PT RTRN 9FT ADLT (ELECTROSURGICAL) ×2 IMPLANT
GAUZE 4X4 16PLY ~~LOC~~+RFID DBL (SPONGE) IMPLANT
GLOVE BIO SURGEON STRL SZ7 (GLOVE) ×2 IMPLANT
GLOVE BIO SURGEON STRL SZ8 (GLOVE) ×2 IMPLANT
GLOVE BIOGEL PI IND STRL 7.0 (GLOVE) ×2 IMPLANT
GOWN STRL REUS W/ TWL LRG LVL3 (GOWN DISPOSABLE) ×2 IMPLANT
GOWN STRL REUS W/ TWL XL LVL3 (GOWN DISPOSABLE) ×2 IMPLANT
GOWN STRL REUS W/TWL 2XL LVL3 (GOWN DISPOSABLE) IMPLANT
HEMOSTAT POWDER KIT SURGIFOAM (HEMOSTASIS) ×2 IMPLANT
KIT BASIN OR (CUSTOM PROCEDURE TRAY) ×2 IMPLANT
KIT TURNOVER KIT B (KITS) ×2 IMPLANT
NDL HYPO 25X1 1.5 SAFETY (NEEDLE) ×2 IMPLANT
NDL SPNL 20GX3.5 QUINCKE YW (NEEDLE) ×2 IMPLANT
NEEDLE HYPO 25X1 1.5 SAFETY (NEEDLE) ×1 IMPLANT
NEEDLE SPNL 20GX3.5 QUINCKE YW (NEEDLE) ×1 IMPLANT
NS IRRIG 1000ML POUR BTL (IV SOLUTION) ×2 IMPLANT
PACK LAMINECTOMY NEURO (CUSTOM PROCEDURE TRAY) ×2 IMPLANT
PAD ARMBOARD POSITIONER FOAM (MISCELLANEOUS) ×6 IMPLANT
PATTIES SURGICAL .5 X.5 (GAUZE/BANDAGES/DRESSINGS) ×2 IMPLANT
PATTIES SURGICAL 1X1 (DISPOSABLE) ×2 IMPLANT
PIN DISTRACTION 14MM (PIN) ×4 IMPLANT
PLATE ANTC INSIG 40 2L (Plate) IMPLANT
PUTTY BONE 2.5CC (Putty) IMPLANT
RASP 3.0MM (RASP) IMPLANT
SCREW VA SINGLE LEAD 4X14 ST (Screw) IMPLANT
SCREW VA ST SINGLE LEAD 4X12 (Screw) IMPLANT
SPACER IDENTITI 7X14X12 7D (Spacer) IMPLANT
SPONGE INTESTINAL PEANUT (DISPOSABLE) ×2 IMPLANT
SPONGE SURGIFOAM ABS GEL SZ50 (HEMOSTASIS) IMPLANT
STRIP CLOSURE SKIN 1/2X4 (GAUZE/BANDAGES/DRESSINGS) ×2 IMPLANT
SUT VIC AB 3-0 SH 8-18 (SUTURE) ×4 IMPLANT
SUT VIC AB 4-0 PS2 18 (SUTURE) IMPLANT
SYR 30ML SLIP (SYRINGE) ×2 IMPLANT
TOWEL GREEN STERILE (TOWEL DISPOSABLE) ×2 IMPLANT
TOWEL GREEN STERILE FF (TOWEL DISPOSABLE) ×2 IMPLANT
WATER STERILE IRR 1000ML POUR (IV SOLUTION) ×2 IMPLANT

## 2023-11-21 NOTE — Anesthesia Procedure Notes (Signed)
 Procedure Name: Intubation Date/Time: 11/21/2023 2:25 PM  Performed by: Alys Julian, CRNAPre-anesthesia Checklist: Patient identified, Emergency Drugs available, Suction available and Patient being monitored Patient Re-evaluated:Patient Re-evaluated prior to induction Oxygen  Delivery Method: Circle system utilized Preoxygenation: Pre-oxygenation with 100% oxygen  Induction Type: IV induction Ventilation: Mask ventilation without difficulty Laryngoscope Size: Glidescope and 3 Grade View: Grade I Tube type: Oral Tube size: 7.0 mm Number of attempts: 1 Airway Equipment and Method: Rigid stylet and Video-laryngoscopy Placement Confirmation: ETT inserted through vocal cords under direct vision, positive ETCO2 and breath sounds checked- equal and bilateral Secured at: 21 cm Tube secured with: Tape Dental Injury: Teeth and Oropharynx as per pre-operative assessment  Difficulty Due To: Difficult Airway- due to reduced neck mobility and Difficulty was anticipated

## 2023-11-21 NOTE — Op Note (Signed)
 11/21/2023  4:45 PM  PATIENT:  Jamie Kim  63 y.o. 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 Decompressive anterior cervical discectomy C3-4 C4-5, 2. Anterior cervical arthrodesis C3-4 C4-5 utilizing a PTI interbody cage packed with locally harvested morcellized autologous bone graft and DBM putty, 3. Anterior cervical plating C3-C5 utilizing a ATEC plate. 4.  Removal of anterior cervical plate Z6-1  SURGEON:  Jamie Hailey, MD  ASSISTANTS: Dr Jamie Kim  ANESTHESIA:   General  EBL: 30 ml  Total I/O In: 600 [IV Piggyback:600] Out: 250 [Urine:250]  BLOOD ADMINISTERED: none  DRAINS: none  SPECIMEN:  none  INDICATION FOR PROCEDURE: This patient presented with neck pain with bilateral shoulder pain. Imaging showed cervical spondylosis with foraminal stenosis C3-4 C4-5 above previous C5-6 fusion. The patient tried conservative measures without relief. Pain was debilitating. Recommended ACDF with plating. Patient understood the risks, benefits, and alternatives and potential outcomes and wished to proceed.  PROCEDURE DETAILS: Patient was brought to the operating room placed under general endotracheal anesthesia. Patient was placed in the supine position on the operating room table. The neck was prepped with Duraprep and draped in a sterile fashion.   Three cc of local anesthesia was injected and a transverse incision was made on the right side of the neck.  Dissection was carried down thru the subcutaneous tissue and the platysma was  elevated, opened, and undermined with Metzenbaum scissors.  Dissection was then carried out thru an avascular plane leaving the sternocleidomastoid carotid artery and jugular vein laterally and the trachea and esophagus medially with the assistance of my nurse practitioner. The ventral aspect of the vertebral column was identified and a localizing x-ray  was taken. The C4-5 level was identified and all in the room agreed with the level.  We also bluntly dissected the tissues away from the surface of the C5-6 plate.  We were able to remove the interlocking screw from the C5 screws in the right C5, but could not remove the locking screw from the CS LP plate on the left at C5.  Therefore we used the metal cutting bur and cut the plate in half and remove the top half of the plate.  The longus colli muscles were then elevated and the retractor was placed with the assistance of my nurse practitioner to expose C3-4 and C4-5. The annulus of C3-4 and C4-5 was incised and the disc space entered. Discectomy was performed with micro-curettes and pituitary rongeurs. I then used the high-speed drill to drill the endplates down to the level of the posterior longitudinal ligament. The drill shavings were saved in a mucous trap for later arthrodesis. The operating microscope was draped and brought into the field provided additional magnification, illumination and visualization. Discectomy was continued posteriorly thru the disc space. Posterior longitudinal ligament was opened with a nerve hook, and then removed along with disc herniation and osteophytes, decompressing the spinal canal and thecal sac. We then continued to remove osteophytic overgrowth and disc material decompressing the neural foramina and exiting nerve roots bilaterally. The scope was angled up and down to help decompress and undercut the vertebral bodies. Once the decompression was completed we could pass a nerve hook circumferentially to assure adequate decompression in the midline and in the neural foramina. So by both visualization and palpation we felt we had an adequate decompression of the neural elements. We then measured the height of the intravertebral disc  space and selected a 6 millimeter PTI interbody cage packed with autograft and DBM for C3-4 and a 7 mm cage for C4-5.Jamie Kim It was then gently positioned  in the intravertebral disc space(s) and countersunk. I then used a 40 mm ATEC plate and placed variable angle screws into the vertebral bodies of each level C3-C4 and C5 and locked them into position. The wound was irrigated with bacitracin solution, checked for hemostasis which was established and confirmed. Once meticulous hemostasis was achieved, we then proceeded with closure with the assistance of my nurse practitioner. The platysma was closed with interrupted 3-0 undyed Vicryl suture, the subcuticular layer was closed with interrupted 3-0 undyed Vicryl suture. The skin edges were approximated with steristrips. The drapes were removed. A sterile dressing was applied. The patient was then awakened from general anesthesia and transferred to the recovery room in stable condition. At the end of the procedure all sponge, needle and instrument counts were correct.   PLAN OF CARE: Admit for overnight observation  PATIENT DISPOSITION:  PACU - hemodynamically stable.   Delay start of Pharmacological VTE agent (>24hrs) due to surgical blood loss or risk of bleeding:  yes

## 2023-11-21 NOTE — H&P (Signed)
 Subjective:   Patient is a 63 y.o. female admitted for neck and shoulder pain. The patient first presented to me with complaints of neck pain and shooting pains in the arm(s). Onset of symptoms was several months ago. The pain is described as aching and occurs all day. The pain is rated severe, and is located in the neck and radiates to the B shoulders. The symptoms have been progressive. Symptoms are exacerbated by extending head backwards, and are relieved by none.  Previous work up includes MRI of cervical spine, results: spinal stenosis.  Past Medical History:  Diagnosis Date   Allergy    Anxiety    Back pain, chronic    DDD (degenerative disc disease), cervical    lumbar   Depression    Fibromyalgia    Fibromyalgia    GERD (gastroesophageal reflux disease)    H/O scarlet fever    Heart murmur    hx   Hyperlipidemia    Hypertension    Migraines    Motion sickness    if reads in car   Multiple thyroid  nodules    PONV (postoperative nausea and vomiting)    Sleep apnea    mild - no CPAP   Vitamin D  deficiency    Wears dentures    partial upper    Past Surgical History:  Procedure Laterality Date   ANTERIOR CERVICAL DECOMP/DISCECTOMY FUSION     BLADDER SUSPENSION     CARDIAC CATHETERIZATION  11/23/12   [facility];EF 65%   CARPAL TUNNEL RELEASE Bilateral    CHOLECYSTECTOMY     COLONOSCOPY WITH PROPOFOL  N/A 06/13/2018   Procedure: COLONOSCOPY WITH BIOPSIES;  Surgeon: Irby Mannan, MD;  Location: [facility];  Service: Endoscopy;  Laterality: N/A;   COLONOSCOPY WITH PROPOFOL  N/A 08/02/2022   Procedure: COLONOSCOPY WITH PROPOFOL  with polypectomy;  Surgeon: Marnee Sink, MD;  Location: [facility];  Service: Endoscopy;  Laterality: N/A;   ESOPHAGOGASTRODUODENOSCOPY (EGD) WITH PROPOFOL  N/A 06/13/2018   Procedure: ESOPHAGOGASTRODUODENOSCOPY (EGD) WITH BIOPSIES;  Surgeon: Irby Mannan, MD;  Location: [facility];  Service: Endoscopy;  Laterality:  N/A;   ESOPHAGOGASTRODUODENOSCOPY (EGD) WITH PROPOFOL  N/A 08/02/2022   Procedure: ESOPHAGOGASTRODUODENOSCOPY (EGD) WITH PROPOFOL ;  Surgeon: Marnee Sink, MD;  Location: [facility];  Service: Endoscopy;  Laterality: N/A;  sleep apnea   HERNIA REPAIR     KNEE SURGERY Left    LAPAROSCOPY     NECK SURGERY     NISSEN FUNDOPLICATION     POLYPECTOMY N/A 06/13/2018   Procedure: POLYPECTOMY;  Surgeon: Irby Mannan, MD;  Location: [facility];  Service: Endoscopy;  Laterality: N/A;   TONSILLECTOMY     TOTAL ABDOMINAL HYSTERECTOMY      Allergies  Allergen Reactions   Codeine Itching   Hydrocodone  Itching    Pt is currently taking this medication   Oxycodone  Itching   Morphine Itching   Oxybutynin  Itching   Tramadol-Acetaminophen  Itching    Social History   Tobacco Use   Smoking status: Former    Current packs/day: 0.00    Average packs/day: 0.5 packs/day for 25.0 years (12.5 ttl pk-yrs)    Types: Cigarettes, E-cigarettes    Start date: 25    Quit date: 2018    Years since quitting: 7.3   Smokeless tobacco: Never   Tobacco comments:    07/27/22 - currently vapes  Substance Use Topics   Alcohol use: No    Alcohol/week: 0.0 standard drinks of alcohol    Comment:  1 month    Family History  Problem Relation Age of Onset   Heart disease Mother    Heart attack Mother        x2   Cancer Father    Melanoma Father    Colon cancer Father    Colon cancer Paternal Grandfather    Breast cancer Neg Hx    Prior to Admission medications   Medication Sig Start Date End Date Taking? Authorizing Provider  buPROPion  (WELLBUTRIN  XL) 150 MG 24 hr tablet TAKE 1 TABLET BY MOUTH EVERY DAY IN THE MORNING 07/18/23  Yes Clarise Crooks, MD  busPIRone  (BUSPAR ) 30 MG tablet Take 1 tablet (30 mg total) by mouth 2 (two) times daily. 07/18/23  Yes Clarise Crooks, MD  cycloSPORINE  (RESTASIS ) 0.05 % ophthalmic emulsion Place 1 drop into both eyes 2 (two) times daily.   Yes [provider]  esomeprazole  (NEXIUM ) 40 MG capsule Take 1 capsule (40 mg total) by mouth 2 (two) times daily before a meal. MUST SCHEDULE OFFICE VISIT 07/18/23  Yes Clarise Crooks, MD  ezetimibe  (ZETIA ) 10 MG tablet Take 1 tablet (10 mg total) by mouth daily. Reported on 10/15/2015 07/18/23  Yes Clarise Crooks, MD  gabapentin  (NEURONTIN ) 600 MG tablet Take 600 mg by mouth 3 (three) times daily.   Yes [provider]  HYDROcodone -acetaminophen  (NORCO) 10-325 MG tablet Take 1 tablet by mouth every 6 (six) hours as needed for moderate pain (pain score 4-6). 10/27/23  Yes [provider]  losartan  (COZAAR ) 50 MG tablet TAKE 1 TABLET BY MOUTH EVERY DAY 10/23/23  Yes Julina Altmann, Deanna C, MD  metoprolol  succinate (TOPROL -XL) 50 MG 24 hr tablet TAKE 1 TABLET BY MOUTH DAILY. TAKE WITH OR IMMEDIATELY FOLLOWING A MEAL. 10/23/23  Yes Clarise Crooks, MD  oxybutynin  (DITROPAN -XL) 5 MG 24 hr tablet TAKE 1 TABLET BY MOUTH EVERYDAY AT BEDTIME 07/18/23  Yes Clarise Crooks, MD  rosuvastatin  (CRESTOR ) 10 MG tablet Take 1 tablet (10 mg total) by mouth daily. 07/18/23 07/17/24 Yes Clarise Crooks, MD  tiZANidine  (ZANAFLEX ) 4 MG tablet Take 2-4 mg by mouth daily as needed for muscle spasms. 10/27/23  Yes [provider]  Vitamin D , Ergocalciferol , (DRISDOL ) 1.25 MG (50000 UNIT) CAPS capsule Take 1 capsule every 7 days. 07/18/23  Yes Clarise Crooks, MD  methadone  (DOLOPHINE ) 5 MG tablet Limit 2-3 tabs by mouth per day if tolerated Patient not taking: Reported on 11/09/2023 02/17/16   Toula French, MD  mupirocin  ointment (BACTROBAN ) 2 % Apply 1 application topically 2 (two) times daily. Patient not taking: Reported on 11/09/2023 03/19/20   Bartholomew Ramesh, Deanna C, MD  nitroGLYCERIN (NITROSTAT) 0.4 MG SL tablet Place 0.4 mg under the tongue every 5 (five) minutes as needed for chest pain. 08/27/22 08/27/23  [provider]  simvastatin  (ZOCOR ) 80 MG tablet Take 1 tablet (80 mg total) by mouth daily. Patient not  taking: Reported on 11/09/2023 06/09/22   Clarise Crooks, MD  tiZANidine  (ZANAFLEX ) 2 MG tablet Limit  2-4 tablets by mouth per day tolerated Patient not taking: Reported on 11/09/2023 02/17/16   Toula French, MD  topiramate  (TOPAMAX ) 50 MG tablet Take 1 tablet (50 mg total) by mouth 2 (two) times daily. Patient not taking: Reported on 11/09/2023 06/09/22   Clarise Crooks, MD     Review of Systems  Positive ROS: neg  All other systems have been reviewed and were otherwise negative with the exception of those mentioned in  the HPI and as above.  Objective: Vital signs in last 24 hours: Temp:  [98 F (36.7 C)] 98 F (36.7 C) (05/19 1042) Pulse Rate:  [82] 82 (05/19 1042) Resp:  [18] 18 (05/19 1042) BP: (170)/(106) 170/106 (05/19 1042) SpO2:  [100 %] 100 % (05/19 1042) Weight:  [83.9 kg] 83.9 kg (05/19 1042)  General Appearance: Alert, cooperative, no distress, appears stated age Head: Normocephalic, without obvious abnormality, atraumatic Eyes: PERRL, conjunctiva/corneas clear, EOM's intact      Neck: Supple, symmetrical, trachea midline, Back: Symmetric, no curvature, ROM normal, no CVA tenderness Lungs:  respirations unlabored Heart: Regular rate and rhythm Abdomen: Soft, non-tender Extremities: Extremities normal, atraumatic, no cyanosis or edema Pulses: 2+ and symmetric all extremities Skin: Skin color, texture, turgor normal, no rashes or lesions  NEUROLOGIC:  Mental status: Alert and oriented x4, no aphasia, good attention span, fund of knowledge and memory  Motor Exam - grossly normal Sensory Exam - grossly normal Reflexes: 1= Coordination - grossly normal Gait - grossly normal Balance - grossly normal Cranial Nerves: I: smell Not tested  II: visual acuity  OS: nl    OD: nl  II: visual fields Full to confrontation  II: pupils Equal, round, reactive to light  III,VII: ptosis None  III,IV,VI: extraocular muscles  Full ROM  V: mastication Normal  V: facial light touch  sensation  Normal  V,VII: corneal reflex  Present  VII: facial muscle function - upper  Normal  VII: facial muscle function - lower Normal  VIII: hearing Not tested  IX: soft palate elevation  Normal  IX,X: gag reflex Present  XI: trapezius strength  5/5  XI: sternocleidomastoid strength 5/5  XI: neck flexion strength  5/5  XII: tongue strength  Normal    Data Review Lab Results  Component Value Date   WBC 5.3 11/16/2023   HGB 13.0 11/16/2023   HCT 38.6 11/16/2023   MCV 91.3 11/16/2023   PLT 346 11/16/2023   Lab Results  Component Value Date   NA 136 11/16/2023   K 4.3 11/16/2023   CL 103 11/16/2023   CO2 25 11/16/2023   BUN 14 11/16/2023   CREATININE 0.98 11/16/2023   GLUCOSE 121 (H) 11/16/2023   Lab Results  Component Value Date   INR 0.9 11/20/2012    Assessment:   Cervical neck pain with herniated nucleus pulposus/ spondylosis/ stenosis at C3-4 C4-5. Estimated body mass index is 26.54 kg/m as calculated from the following:   Height as of this encounter: 5\' 10"  (1.778 m).   Weight as of this encounter: 83.9 kg.  Patient has failed conservative therapy. Planned surgery : ACDF C3-4 C4-5  Plan:   I explained the condition and procedure to the patient and answered any questions.  Patient wishes to proceed with procedure as planned. Understands risks/ benefits/ and expected or typical outcomes.  Isadora Mar 11/21/2023 1:57 PM

## 2023-11-21 NOTE — Anesthesia Postprocedure Evaluation (Signed)
 Anesthesia Post Note  Patient: Jamie Kim  Procedure(s) Performed: ANTERIOR CERVICAL DECOMPRESSION/DISCECTOMY FUSION CERVICAL THREE-FOUR CERVICAL FOUR-FIVE, REMOVAL PLATE CERVICAL FIVE-SIX (Spine Cervical)     Patient location during evaluation: PACU Anesthesia Type: General Level of consciousness: awake and alert Pain management: pain level controlled Vital Signs Assessment: post-procedure vital signs reviewed and stable Respiratory status: spontaneous breathing, nonlabored ventilation and respiratory function stable Cardiovascular status: blood pressure returned to baseline and stable Postop Assessment: no apparent nausea or vomiting Anesthetic complications: no   No notable events documented.  Last Vitals:  Vitals:   11/21/23 1705 11/21/23 1715  BP: 137/87 132/78  Pulse: 63 69  Resp: 14 16  Temp: 36.6 C   SpO2: 96% 99%    Last Pain:  Vitals:   11/21/23 1705  TempSrc:   PainSc: 6                  Erin Havers

## 2023-11-21 NOTE — Transfer of Care (Signed)
 Immediate Anesthesia Transfer of Care Note  Patient: Marchia Seton  Procedure(s) Performed: ANTERIOR CERVICAL DECOMPRESSION/DISCECTOMY FUSION CERVICAL THREE-FOUR CERVICAL FOUR-FIVE, REMOVAL PLATE CERVICAL FIVE-SIX (Spine Cervical)  Patient Location: PACU  Anesthesia Type:General  Level of Consciousness: awake, alert , and oriented  Airway & Oxygen  Therapy: Patient Spontanous Breathing and Patient connected to face mask oxygen   Post-op Assessment: Report given to RN, Post -op Vital signs reviewed and stable, Patient moving all extremities X 4, and Patient able to stick tongue midline  Post vital signs: Reviewed and stable  Last Vitals:  Vitals Value Taken Time  BP 135/84   Temp 97.8   Pulse 72   Resp 16   SpO2 100     Last Pain:  Vitals:   11/21/23 1102  TempSrc:   PainSc: 5       Patients Stated Pain Goal: 4 (11/21/23 1102)  Complications: No notable events documented.

## 2023-11-22 DIAGNOSIS — M4802 Spinal stenosis, cervical region: Secondary | ICD-10-CM | POA: Diagnosis not present

## 2023-11-22 DIAGNOSIS — I10 Essential (primary) hypertension: Secondary | ICD-10-CM | POA: Diagnosis not present

## 2023-11-22 DIAGNOSIS — M50121 Cervical disc disorder at C4-C5 level with radiculopathy: Secondary | ICD-10-CM | POA: Diagnosis not present

## 2023-11-22 DIAGNOSIS — M5011 Cervical disc disorder with radiculopathy,  high cervical region: Secondary | ICD-10-CM | POA: Diagnosis not present

## 2023-11-22 DIAGNOSIS — M4722 Other spondylosis with radiculopathy, cervical region: Secondary | ICD-10-CM | POA: Diagnosis not present

## 2023-11-22 DIAGNOSIS — Z79899 Other long term (current) drug therapy: Secondary | ICD-10-CM | POA: Diagnosis not present

## 2023-11-22 DIAGNOSIS — Z87891 Personal history of nicotine dependence: Secondary | ICD-10-CM | POA: Diagnosis not present

## 2023-11-22 MED ORDER — HYDROCODONE-ACETAMINOPHEN 10-325 MG PO TABS: 1.0000 | ORAL_TABLET | ORAL | 0 refills | Status: AC | PRN

## 2023-11-22 MED ORDER — TIZANIDINE HCL 4 MG PO TABS: 4.0000 mg | ORAL_TABLET | Freq: Three times a day (TID) | ORAL | 1 refills | Status: AC | PRN

## 2023-11-22 NOTE — Discharge Summary (Signed)
 Physician Discharge Summary  Patient ID: Jamie Kim MRN: [id number] DOB/AGE: [birth year] 63 y.o.  Admit date: 11/21/2023 Discharge date: 11/22/2023  Admission Diagnoses: cervical stenosis with radiculopathy    Discharge Diagnoses: same   Discharged Condition: good  Hospital Course: The patient was admitted on 11/21/2023 and taken to the operating room where the patient underwent ACDF C3-4 c4-5. The patient tolerated the procedure well and was taken to the recovery room and then to the f\loor in stable condition. The hospital course was routine. There were no complications. The wound remained clean dry and intact. Pt had appropriate neck soreness. No complaints of arm pain or new N/T/W. The patient remained afebrile with stable vital signs, and tolerated a regular diet. The patient continued to increase activities, and pain was well controlled with oral pain medications.   Consults: None  Significant Diagnostic Studies:  Results for orders placed or performed during the hospital encounter of 11/16/23  Surgical pcr screen   Collection Time: 11/16/23 11:43 AM   Specimen: Nasal Mucosa; Nasal Swab  Result Value Ref Range   MRSA, PCR NEGATIVE NEGATIVE   Staphylococcus aureus POSITIVE (A) NEGATIVE  Basic metabolic panel per protocol   Collection Time: 11/16/23 12:14 PM  Result Value Ref Range   Sodium 136 135 - 145 mmol/L   Potassium 4.3 3.5 - 5.1 mmol/L   Chloride 103 98 - 111 mmol/L   CO2 25 22 - 32 mmol/L   Glucose, Bld 121 (H) 70 - 99 mg/dL   BUN 14 8 - 23 mg/dL   Creatinine, Ser 4.09 0.44 - 1.00 mg/dL   Calcium  10.0 8.9 - 10.3 mg/dL   GFR, Estimated >81 >19 mL/min   Anion gap 8 5 - 15  CBC per protocol   Collection Time: 11/16/23 12:14 PM  Result Value Ref Range   WBC 5.3 4.0 - 10.5 K/uL   RBC 4.23 3.87 - 5.11 MIL/uL   Hemoglobin 13.0 12.0 - 15.0 g/dL   HCT 14.7 82.9 - 56.2 %   MCV 91.3 80.0 - 100.0 fL   MCH 30.7 26.0 - 34.0 pg   MCHC 33.7 30.0 - 36.0 g/dL   RDW  13.0 86.5 - 78.4 %   Platelets 346 150 - 400 K/uL   nRBC 0.0 0.0 - 0.2 %    DG Cervical Spine 1 View Result Date: 11/21/2023 CLINICAL DATA:  Elective surgery. EXAM: DG CERVICAL SPINE - 1 VIEW COMPARISON:  09/22/2023 FINDINGS: Three fluoroscopic spot views of the cervical spine submitted from the operating room. Previous fusion at C5-C6 and C6-C7. Subsequent anterior fusion C3 through C5 with interbody spacers. Previous C5-C6 plate has been removed. Fluoroscopy time 10 seconds. Dose 1.54 mGy. IMPRESSION: Intraoperative fluoroscopy during cervical fusion. Electronically Signed   By: Chadwick Colonel M.D.   On: 11/21/2023 17:07   DG C-Arm 1-60 Min-No Report Result Date: 11/21/2023 Fluoroscopy was utilized by the requesting physician.  No radiographic interpretation.   DG C-Arm 1-60 Min-No Report Result Date: 11/21/2023 Fluoroscopy was utilized by the requesting physician.  No radiographic interpretation.    Antibiotics:  Anti-infectives (From admission, onward)    Start     Dose/Rate Route Frequency Ordered Stop   11/21/23 2000  ceFAZolin  (ANCEF ) IVPB 2g/100 mL premix        2 g 200 mL/hr over 30 Minutes Intravenous Every 8 hours 11/21/23 1829 11/22/23 0718   11/21/23 1045  ceFAZolin  (ANCEF ) IVPB 2g/100 mL premix        2 g 200  mL/hr over 30 Minutes Intravenous On call to O.R. 11/21/23 1043 11/21/23 1457       Discharge Exam: Blood pressure 101/62, pulse 69, temperature 100 F (37.8 C), temperature source Oral, resp. rate 16, height 5\' 10"  (1.778 m), weight 83.9 kg, SpO2 97%. Neurologic: Grossly normal Dressing dry  Discharge Medications:   Allergies as of 11/22/2023       Reactions   Codeine Itching   Hydrocodone  Itching   Pt is currently taking this medication   Oxycodone  Itching   Morphine Itching   Oxybutynin  Itching   Tramadol-acetaminophen  Itching        Medication List     STOP taking these medications    methadone  5 MG tablet Commonly known as: DOLOPHINE     mupirocin  ointment 2 % Commonly known as: Bactroban    simvastatin  80 MG tablet Commonly known as: ZOCOR    topiramate  50 MG tablet Commonly known as: TOPAMAX        TAKE these medications    buPROPion  150 MG 24 hr tablet Commonly known as: WELLBUTRIN  XL TAKE 1 TABLET BY MOUTH EVERY DAY IN THE MORNING   busPIRone  30 MG tablet Commonly known as: BUSPAR  Take 1 tablet (30 mg total) by mouth 2 (two) times daily.   cycloSPORINE  0.05 % ophthalmic emulsion Commonly known as: RESTASIS  Place 1 drop into both eyes 2 (two) times daily.   esomeprazole  40 MG capsule Commonly known as: NEXIUM  Take 1 capsule (40 mg total) by mouth 2 (two) times daily before a meal. MUST SCHEDULE OFFICE VISIT   ezetimibe  10 MG tablet Commonly known as: ZETIA  Take 1 tablet (10 mg total) by mouth daily. Reported on 10/15/2015   gabapentin  600 MG tablet Commonly known as: NEURONTIN  Take 600 mg by mouth 3 (three) times daily.   HYDROcodone -acetaminophen  10-325 MG tablet Commonly known as: NORCO Take 1 tablet by mouth every 4 (four) hours as needed for moderate pain (pain score 4-6). What changed: when to take this   losartan  50 MG tablet Commonly known as: COZAAR  TAKE 1 TABLET BY MOUTH EVERY DAY   metoprolol  succinate 50 MG 24 hr tablet Commonly known as: TOPROL -XL TAKE 1 TABLET BY MOUTH DAILY. TAKE WITH OR IMMEDIATELY FOLLOWING A MEAL.   nitroGLYCERIN 0.4 MG SL tablet Commonly known as: NITROSTAT Place 0.4 mg under the tongue every 5 (five) minutes as needed for chest pain.   oxybutynin  5 MG 24 hr tablet Commonly known as: DITROPAN -XL TAKE 1 TABLET BY MOUTH EVERYDAY AT BEDTIME   rosuvastatin  10 MG tablet Commonly known as: CRESTOR  Take 1 tablet (10 mg total) by mouth daily.   tiZANidine  4 MG tablet Commonly known as: ZANAFLEX  Take 1 tablet (4 mg total) by mouth every 8 (eight) hours as needed for muscle spasms. What changed:  how much to take when to take this Another medication with  the same name was removed. Continue taking this medication, and follow the directions you see here.   Vitamin D  (Ergocalciferol ) 1.25 MG (50000 UNIT) Caps capsule Commonly known as: DRISDOL  Take 1 capsule every 7 days.        Disposition: home   Final Dx: ACDF C3-4 C4-5  Discharge Instructions     Call MD for:  difficulty breathing, headache or visual disturbances   Complete by: As directed    Call MD for:  persistant nausea and vomiting   Complete by: As directed    Call MD for:  redness, tenderness, or signs of infection (pain, swelling, redness, odor or green/yellow  discharge around incision site)   Complete by: As directed    Call MD for:  severe uncontrolled pain   Complete by: As directed    Call MD for:  temperature >100.4   Complete by: As directed    Diet - low sodium heart healthy   Complete by: As directed    Increase activity slowly   Complete by: As directed    Remove dressing in 24 hours   Complete by: As directed           Signed: Isadora Mar 11/22/2023, 7:58 AM  ACDF

## 2023-11-22 NOTE — Evaluation (Signed)
 Occupational Therapy Evaluation and Discharge Patient Details Name: Jamie Kim MRN: [id number] DOB: [birth year] Today's Date: 11/22/2023   History of Present Illness   Pt is a 63 yo female s/p decompressive anterior cervical discectomy C3-4 C4-5, anterior cervical arthrodesis C3-4 C4-5, anterior cervical plating C3-C5, and removal of anterior cervical plate X9-1. PHMx: anxiety, chronic back pain, DDD cervical and lumbar, depression, fibromyalgia, HTN, migraines, cervical fusion     Clinical Impressions This 63 yo female admitted and underwent above presents to acute OT with all education completed and post op cervical handout provided, no further OT needs, we will sign off.     If plan is discharge home, recommend the following:   Assist for transportation     Functional Status Assessment   Patient has had a recent decline in their functional status and demonstrates the ability to make significant improvements in function in a reasonable and predictable amount of time. (without further need for skilled OT)     Equipment Recommendations   None recommended by OT      Precautions/Restrictions   Precautions Precautions: Cervical Precaution Booklet Issued: Yes (comment) Recall of Precautions/Restrictions: Intact Required Braces or Orthoses:  (no brace needed per chart) Restrictions Weight Bearing Restrictions Per Provider Order: No     Mobility Bed Mobility Overal bed mobility: Modified Independent                  Transfers Overall transfer level: Independent                        Balance Overall balance assessment: Independent                                         ADL either performed or assessed with clinical judgement   ADL                                         General ADL Comments: Educated on dressing, use of 2 cups for brushing teeth, drinking from cups with straws only, using HH shower,  positioning in bed/recliner     Vision Patient Visual Report: No change from baseline              Pertinent Vitals/Pain Pain Assessment Pain Assessment: 0-10 Pain Score: 7  Pain Location: incisional and headache Pain Descriptors / Indicators: Aching, Sore Pain Intervention(s): Limited activity within patient's tolerance, Monitored during session     Extremity/Trunk Assessment Upper Extremity Assessment Upper Extremity Assessment: Overall WFL for tasks assessed              Cognition Arousal: Alert Behavior During Therapy: WFL for tasks assessed/performed Cognition: No apparent impairments                               Following commands: Intact       Cueing    Cueing Techniques: Verbal cues              Home Living Family/patient expects to be discharged to:: Private residence Living Arrangements: Children;Other relatives Available Help at Discharge: Family;Available PRN/intermittently  Home Equipment: Hand held shower head          Prior Functioning/Environment Prior Level of Function : Independent/Modified Independent                    OT Problem List: Decreased range of motion;Pain        OT Goals(Current goals can be found in the care plan section)   Acute Rehab OT Goals Patient Stated Goal: to go home today         AM-PAC OT "6 Clicks" Daily Activity     Outcome Measure Help from another person eating meals?: None Help from another person taking care of personal grooming?: None Help from another person toileting, which includes using toliet, bedpan, or urinal?: None Help from another person bathing (including washing, rinsing, drying)?: None Help from another person to put on and taking off regular upper body clothing?: None Help from another person to put on and taking off regular lower body clothing?: None 6 Click Score: 24   End of Session Nurse Communication:  (no further  OT needs)  Activity Tolerance: Patient tolerated treatment well Patient left: in bed  OT Visit Diagnosis: Pain Pain - part of body:  (incisional and headache)                Time: 4259-5638 OT Time Calculation (min): 9 min Charges:  OT General Charges $OT Visit: 1 Visit OT Evaluation $OT Eval Low Complexity: 1 Low  Merryl Abraham OT Acute Rehabilitation Services Office [phone]    Lenox Raider 11/22/2023, 9:19 AM

## 2023-11-22 NOTE — Progress Notes (Signed)
 Patient alert and oriented, mae's well, voiding adequate amount of urine, swallowing without difficulty, no c/o pain at time of discharge. Patient discharged home with family. Script and discharged instructions given to patient. Patient and family stated understanding of instructions given. Room was checked and accounted for all patient's belongings; discharge instructions concerning his medications, incision care, follow up appointment and when to call the doctor as needed were all discussed with patient by RN and she expressed understanding on the instructions given

## 2023-11-24 ENCOUNTER — Encounter (HOSPITAL_COMMUNITY): Payer: Self-pay | Admitting: Neurological Surgery

## 2023-11-24 DIAGNOSIS — M5459 Other low back pain: Secondary | ICD-10-CM | POA: Diagnosis not present

## 2023-11-24 DIAGNOSIS — Z79891 Long term (current) use of opiate analgesic: Secondary | ICD-10-CM | POA: Diagnosis not present

## 2023-11-24 DIAGNOSIS — M5416 Radiculopathy, lumbar region: Secondary | ICD-10-CM | POA: Diagnosis not present

## 2023-11-24 DIAGNOSIS — M542 Cervicalgia: Secondary | ICD-10-CM | POA: Diagnosis not present

## 2023-11-24 DIAGNOSIS — G894 Chronic pain syndrome: Secondary | ICD-10-CM | POA: Diagnosis not present

## 2023-11-24 DIAGNOSIS — Z5181 Encounter for therapeutic drug level monitoring: Secondary | ICD-10-CM | POA: Diagnosis not present

## 2023-11-25 ENCOUNTER — Encounter (HOSPITAL_COMMUNITY): Payer: Self-pay | Admitting: Neurological Surgery

## 2023-11-29 ENCOUNTER — Encounter: Payer: Self-pay | Admitting: Family Medicine

## 2023-11-29 ENCOUNTER — Ambulatory Visit: Admitting: Student

## 2023-11-29 ENCOUNTER — Ambulatory Visit: Payer: Self-pay | Admitting: Family Medicine

## 2023-12-05 DIAGNOSIS — G894 Chronic pain syndrome: Secondary | ICD-10-CM | POA: Diagnosis not present

## 2023-12-05 DIAGNOSIS — M5459 Other low back pain: Secondary | ICD-10-CM | POA: Diagnosis not present

## 2023-12-05 DIAGNOSIS — Z5181 Encounter for therapeutic drug level monitoring: Secondary | ICD-10-CM | POA: Diagnosis not present

## 2023-12-05 DIAGNOSIS — Z79891 Long term (current) use of opiate analgesic: Secondary | ICD-10-CM | POA: Diagnosis not present

## 2023-12-22 DIAGNOSIS — M542 Cervicalgia: Secondary | ICD-10-CM | POA: Diagnosis not present

## 2023-12-22 DIAGNOSIS — Z79891 Long term (current) use of opiate analgesic: Secondary | ICD-10-CM | POA: Diagnosis not present

## 2023-12-22 DIAGNOSIS — Z5181 Encounter for therapeutic drug level monitoring: Secondary | ICD-10-CM | POA: Diagnosis not present

## 2023-12-22 DIAGNOSIS — G894 Chronic pain syndrome: Secondary | ICD-10-CM | POA: Diagnosis not present

## 2023-12-22 DIAGNOSIS — M5459 Other low back pain: Secondary | ICD-10-CM | POA: Diagnosis not present

## 2024-02-03 ENCOUNTER — Ambulatory Visit (INDEPENDENT_AMBULATORY_CARE_PROVIDER_SITE_OTHER): Admitting: Student

## 2024-02-03 ENCOUNTER — Encounter: Payer: Self-pay | Admitting: Student

## 2024-02-03 VITALS — BP 150/96 | HR 73 | Ht 70.0 in | Wt 185.0 lb

## 2024-02-03 DIAGNOSIS — I10 Essential (primary) hypertension: Secondary | ICD-10-CM | POA: Diagnosis not present

## 2024-02-03 DIAGNOSIS — F339 Major depressive disorder, recurrent, unspecified: Secondary | ICD-10-CM

## 2024-02-03 DIAGNOSIS — F419 Anxiety disorder, unspecified: Secondary | ICD-10-CM | POA: Diagnosis not present

## 2024-02-03 DIAGNOSIS — G894 Chronic pain syndrome: Secondary | ICD-10-CM | POA: Insufficient documentation

## 2024-02-03 DIAGNOSIS — M797 Fibromyalgia: Secondary | ICD-10-CM | POA: Insufficient documentation

## 2024-02-03 MED ORDER — BUPROPION HCL ER (XL) 300 MG PO TB24: ORAL_TABLET | ORAL | 1 refills | Status: DC

## 2024-02-03 MED ORDER — METOPROLOL SUCCINATE ER 25 MG PO TB24: 25.0000 mg | ORAL_TABLET | Freq: Every day | ORAL | 1 refills | Status: DC

## 2024-02-03 NOTE — Progress Notes (Signed)
 Established Patient Office Visit  Subjective   Patient ID: Jamie Kim, female    DOB: August 01, 1960  Age: 63 y.o. MRN: [id number]  Chief Complaint  Patient presents with   Establish Care    Patient is here to transition from Dr. Joshua to new PCP   Hypertension   Discussed the use of AI scribe software for clinical note transcription with the patient, who gave verbal consent to proceed.  History of Present Illness Jamie Kim is a 63 year old female with hypertension who presents with concerns about low heart rate and anxiety.  She experiences nausea and lack of energy when her heart rate drops to about 50 bpm, even during rest or light activities. Her heart rate, previously managed with metoprolol , stayed around 90-100 bpm without medication but now does not usually exceed 70 bpm. She plans to start keeping a log of her heart rate.   Her current medications include 50 mg of metoprolol , 25 mg of losartan , and she no longer takes hydrochlorothiazide. She monitors her blood pressure at home, typically around 110/70 mmHg.  She experiences significant stress and anxiety, exacerbated by her grandson's severe OCD and her husband's mobility issues. She has been on bupropion  and Buspar  for 35 years for anxiety and depression, with current symptoms including a lack of motivation and feeling overwhelmed by caregiving responsibilities.    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   Family history of malignant neoplasm of gastrointestinal tract    Gastric polyp    Status post Nissen fundoplication (without gastrostomy tube) procedure    Essential hypertension 09/03/2016   Periodic headache syndrome, not intractable 09/03/2016   Gastroesophageal reflux disease 09/03/2016   Vitamin D  deficiency 09/03/2016   Chronic anxiety  09/03/2016   Depression, recurrent (HCC) 09/03/2016   DDD (degenerative disc disease), lumbar 02/26/2015   Bilateral occipital neuralgia 01/27/2015   Spinal stenosis of lumbar region 11/25/2014   DDD (degenerative disc disease), cervical 11/24/2014   Lumbosacral facet joint syndrome 11/24/2014   Sacroiliac joint dysfunction of both sides 11/24/2014   Cervical facet syndrome 11/24/2014   S/P cervical spinal fusion 11/24/2014   Hyperlipidemia    Palpitations 10/26/2012      ROS Refer to HPI    Objective:     BP (!) 150/96   Pulse 73   Ht 5' 10 (1.778 m)   Wt 185 lb (83.9 kg)   SpO2 100%   BMI 26.54 kg/m  BP Readings from Last 3 Encounters:  02/03/24 (!) 150/96  11/22/23 101/62  11/16/23 (!) 144/97    Physical Exam Constitutional:      Appearance: Normal appearance.  HENT:     Mouth/Throat:     Mouth: Mucous membranes are moist.     Pharynx: Oropharynx is clear.  Cardiovascular:     Rate and Rhythm: Normal rate and regular rhythm.  Pulmonary:     Effort: Pulmonary effort is normal.     Breath sounds: No rhonchi or rales.  Abdominal:     General: Abdomen is flat. Bowel sounds are normal. There is no distension.     Palpations: Abdomen is soft.     Tenderness: There is no abdominal tenderness.  Musculoskeletal:        General: Normal range of motion.     Right lower leg: No edema.  Left lower leg: No edema.  Skin:    General: Skin is warm and dry.     Capillary Refill: Capillary refill takes less than 2 seconds.  Neurological:     General: No focal deficit present.     Mental Status: She is alert and oriented to person, place, and time.  Psychiatric:        Mood and Affect: Mood normal.        Behavior: Behavior normal.        02/03/2024    1:24 PM 09/29/2023    3:11 PM 07/18/2023    2:06 PM  Depression screen PHQ 2/9  Decreased Interest 2 0 0  Down, Depressed, Hopeless 2 0 0  PHQ - 2 Score 4 0 0  Altered sleeping 2  0  Tired, decreased energy 2   0  Change in appetite 1  0  Feeling bad or failure about yourself  2  0  Trouble concentrating 2  0  Moving slowly or fidgety/restless 1  0  Suicidal thoughts 1  0  PHQ-9 Score 15  0  Difficult doing work/chores Somewhat difficult  Not difficult at all       02/03/2024    1:24 PM 07/18/2023    2:06 PM 06/27/2023    3:34 PM 08/24/2022    2:34 PM  GAD 7 : Generalized Anxiety Score  Nervous, Anxious, on Edge 2 0 1 0  Control/stop worrying 2 0 1 0  Worry too much - different things 2 0 1 0  Trouble relaxing 2 0 1 0  Restless 2 0 1 0  Easily annoyed or irritable 2 0 2 0  Afraid - awful might happen 1 0 1 0  Total GAD 7 Score 13 0 8 0  Anxiety Difficulty Somewhat difficult Not difficult at all Not difficult at all Not difficult at all    No results found for any visits on 02/03/24.  Last CBC Lab Results  Component Value Date   WBC 5.3 11/16/2023   HGB 13.0 11/16/2023   HCT 38.6 11/16/2023   MCV 91.3 11/16/2023   MCH 30.7 11/16/2023   RDW 12.8 11/16/2023   PLT 346 11/16/2023   Last metabolic panel Lab Results  Component Value Date   GLUCOSE 121 (H) 11/16/2023   NA 136 11/16/2023   K 4.3 11/16/2023   CL 103 11/16/2023   CO2 25 11/16/2023   BUN 14 11/16/2023   CREATININE 0.98 11/16/2023   GFRNONAA >60 11/16/2023   CALCIUM  10.0 11/16/2023   PHOS 4.7 (H) 02/02/2021   PROT 6.2 06/09/2022   ALBUMIN  4.3 06/09/2022   LABGLOB 1.9 06/09/2022   AGRATIO 2.3 (H) 06/09/2022   BILITOT <0.2 06/09/2022   ALKPHOS 72 06/09/2022   AST 16 06/09/2022   ALT 12 06/09/2022   ANIONGAP 8 11/16/2023      The 10-year ASCVD risk score (Arnett DK, et al., 2019) is: 5.8%    Assessment & Plan:  Essential hypertension Assessment & Plan: Current medication is metoprolol  50 mg daily and losartan  25 mg daily. Was taking losartan  50 mg daily but this made her dizzy. Not HR can drop to 50-60 on the metoprolol .  Decrease metoprolol  to 25 mg daily and continue losartan . Follow up in 1 month to  check on BP, she will keep HR and BP log.   Orders: -     Metoprolol  Succinate ER; Take 1 tablet (25 mg total) by mouth daily. TAKE WITH OR IMMEDIATELY FOLLOWING A  MEAL.  Dispense: 30 tablet; Refill: 1  Depression, recurrent (HCC) Assessment & Plan: Previously well controlled on bupropion  150 mg daily. PHQ 9 is 15 today, largely related to caretaker burnout symptoms. Has passive SI without plan. Able to contract for safety today. Will increase bupropion  to 300 mg daily. Declined counseling. 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 Increase bupropion  to 300 mg daily. Follow up in 1 month      Return in about 4 weeks (around 03/02/2024) for HTN.    Harlene Saddler, MD

## 2024-02-06 NOTE — Assessment & Plan Note (Signed)
 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 Increase bupropion  to 300 mg daily. Follow up in 1 month

## 2024-02-06 NOTE — Assessment & Plan Note (Signed)
 Current medication is metoprolol  50 mg daily and losartan  25 mg daily. Was taking losartan  50 mg daily but this made her dizzy. Not HR can drop to 50-60 on the metoprolol .  Decrease metoprolol  to 25 mg daily and continue losartan . Follow up in 1 month to check on BP, she will keep HR and BP log.

## 2024-02-06 NOTE — Assessment & Plan Note (Addendum)
 Previously well controlled on bupropion  150 mg daily. PHQ 9 is 15 today, largely related to caretaker burnout symptoms. Has passive SI without plan. Able to contract for safety today. Will increase bupropion  to 300 mg daily. Declined counseling. Follow up in 1 month

## 2024-03-02 ENCOUNTER — Other Ambulatory Visit: Payer: Self-pay | Admitting: Student

## 2024-03-02 DIAGNOSIS — F339 Major depressive disorder, recurrent, unspecified: Secondary | ICD-10-CM

## 2024-03-04 NOTE — Telephone Encounter (Signed)
 Requested medication (s) are due for refill today: Yes  Requested medication (s) are on the active medication list: Yes  Last refill:  02/03/24  Future visit scheduled: Yes  Notes to clinic:  Routing to provider, pharmacy request for 90 day supply     Requested Prescriptions  Pending Prescriptions Disp Refills   buPROPion  (WELLBUTRIN  XL) 300 MG 24 hr tablet [Pharmacy Med Name: BUPROPION  HCL XL 300 MG TABLET] 90 tablet 1    Sig: TAKE 1 TABLET BY MOUTH EVERY DAY IN THE MORNING     Psychiatry: Antidepressants - bupropion  Failed - 03/04/2024 10:08 PM      Failed - AST in normal range and within 360 days    AST  Date Value Ref Range Status  06/09/2022 16 0 - 40 IU/L Final   SGOT(AST)  Date Value Ref Range Status  10/02/2012 18 15 - 37 Unit/L Final         Failed - ALT in normal range and within 360 days    ALT  Date Value Ref Range Status  06/09/2022 12 0 - 32 IU/L Final   SGPT (ALT)  Date Value Ref Range Status  10/02/2012 22 12 - 78 U/L Final         Failed - Last BP in normal range    BP Readings from Last 1 Encounters:  02/03/24 (!) 150/96         Passed - Cr in normal range and within 360 days    Creatinine  Date Value Ref Range Status  10/02/2012 1.05 0.60 - 1.30 mg/dL Final   Creatinine, Ser  Date Value Ref Range Status  11/16/2023 0.98 0.44 - 1.00 mg/dL Final         Passed - Completed PHQ-2 or PHQ-9 in the last 360 days      Passed - Valid encounter within last 6 months    Recent Outpatient Visits           1 month ago Essential hypertension   [facility], [place]   5 months ago Essential hypertension   [facility] Joshua Cathryne JAYSON, MD

## 2024-03-06 NOTE — Telephone Encounter (Signed)
 Please review medication refill request

## 2024-03-08 ENCOUNTER — Ambulatory Visit (INDEPENDENT_AMBULATORY_CARE_PROVIDER_SITE_OTHER): Admitting: Student

## 2024-03-08 ENCOUNTER — Encounter: Payer: Self-pay | Admitting: Student

## 2024-03-08 VITALS — BP 114/80 | HR 63 | Ht 70.0 in | Wt 187.0 lb

## 2024-03-08 DIAGNOSIS — I10 Essential (primary) hypertension: Secondary | ICD-10-CM

## 2024-03-08 DIAGNOSIS — G5600 Carpal tunnel syndrome, unspecified upper limb: Secondary | ICD-10-CM | POA: Insufficient documentation

## 2024-03-08 DIAGNOSIS — R002 Palpitations: Secondary | ICD-10-CM

## 2024-03-08 DIAGNOSIS — M1990 Unspecified osteoarthritis, unspecified site: Secondary | ICD-10-CM | POA: Insufficient documentation

## 2024-03-08 DIAGNOSIS — F419 Anxiety disorder, unspecified: Secondary | ICD-10-CM

## 2024-03-08 DIAGNOSIS — F339 Major depressive disorder, recurrent, unspecified: Secondary | ICD-10-CM | POA: Diagnosis not present

## 2024-03-08 DIAGNOSIS — Z23 Encounter for immunization: Secondary | ICD-10-CM | POA: Diagnosis not present

## 2024-03-08 DIAGNOSIS — M4310 Spondylolisthesis, site unspecified: Secondary | ICD-10-CM | POA: Insufficient documentation

## 2024-03-08 NOTE — Assessment & Plan Note (Signed)
 Hx of PVCs, having occasional palpitations with decrease in metoprolol  succinate 25 mg daily. Reports HR increased to 100 when she is late to take BB and she feels short of breath. No palpitations in office today. Discussed heart monitor, she would like to follow up with cardiology at [facility] first. Return precautions reviewed.

## 2024-03-08 NOTE — Assessment & Plan Note (Addendum)
 Mild improvement on bupropion  300 mg daily, no adverse effects with this.  PHQ9 down to 12 today. Denies SI, HI, AVD. Continue to have significant home stressors which are unlikely to change. Will continue to monitor mood.

## 2024-03-08 NOTE — Assessment & Plan Note (Addendum)
 BP well controlled day. No elevation since increase bupropion  or with decreasing metoprolol . She's have some orthostatic hypotension when she wakes at night occasionally but overall this is improved. Likely exacerbated by bradycardia in the mid to upper 50s due to her BB. Discussed discontinuing BB but she reports significant symptoms with palpitations regarding this. Continue lisinopril 25 mg daily and metoprolol  succinate 25 mg daily.

## 2024-03-08 NOTE — Progress Notes (Signed)
 Established Patient Office Visit  Subjective   Patient ID: Jamie Kim, female    DOB: [birth year]  Age: 63 y.o. MRN: [id number]  Chief Complaint  Patient presents with   Hypertension    Jamie Kim with medical hx listed below presents today for follow up of hypertension. Please refer to problem based charting for further details and assessment and plan of current problem and chronic medical conditions.   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   Family history of malignant neoplasm of gastrointestinal tract    Gastric polyp    Status post Nissen fundoplication (without gastrostomy tube) procedure    Essential hypertension 09/03/2016   Periodic headache syndrome, not intractable 09/03/2016   Gastroesophageal reflux disease 09/03/2016   Vitamin D  deficiency 09/03/2016   Chronic anxiety 09/03/2016   Depression, recurrent (HCC) 09/03/2016   DDD (degenerative disc disease), lumbar 02/26/2015   Bilateral occipital neuralgia 01/27/2015   Spinal stenosis of lumbar region 11/25/2014   DDD (degenerative disc disease), cervical 11/24/2014   Lumbosacral facet joint syndrome 11/24/2014   Sacroiliac joint dysfunction of both sides 11/24/2014   Cervical facet syndrome 11/24/2014   S/P cervical spinal fusion 11/24/2014   Hyperlipidemia    Palpitations 10/26/2012      ROS Refer to HPI    Objective:     Outpatient Encounter Medications as of 03/08/2024  Medication Sig   buPROPion  (WELLBUTRIN  XL) 300 MG 24 hr tablet TAKE 1 TABLET BY MOUTH EVERY DAY IN THE MORNING   busPIRone  (BUSPAR ) 30 MG tablet Take 1 tablet (30 mg total) by mouth 2 (two) times daily.   cycloSPORINE  (RESTASIS ) 0.05 % ophthalmic  emulsion Place 1 drop into both eyes 2 (two) times daily.   esomeprazole  (NEXIUM ) 40 MG capsule Take 1 capsule (40 mg total) by mouth 2 (two) times daily before a meal. MUST SCHEDULE OFFICE VISIT   ezetimibe  (ZETIA ) 10 MG tablet Take 1 tablet (10 mg total) by mouth daily. Reported on 10/15/2015   fluticasone (FLONASE) 50 MCG/ACT nasal spray Place 2 sprays into both nostrils daily.   gabapentin  (NEURONTIN ) 600 MG tablet Take 600 mg by mouth 3 (three) times daily.   HYDROcodone -acetaminophen  (NORCO) 10-325 MG tablet Take 1 tablet by mouth every 4 (four) hours as needed for moderate pain (pain score 4-6).   losartan  (COZAAR ) 50 MG tablet TAKE 1 TABLET BY MOUTH EVERY DAY (Patient taking differently: Take 25 mg by mouth daily.)   metoprolol  succinate (TOPROL -XL) 25 MG 24 hr tablet Take 1 tablet (25 mg total) by mouth daily. TAKE WITH OR IMMEDIATELY FOLLOWING A MEAL.   oxybutynin  (DITROPAN -XL) 5 MG 24 hr tablet TAKE 1 TABLET BY MOUTH EVERYDAY AT BEDTIME   rosuvastatin  (CRESTOR ) 10 MG tablet Take 1 tablet (10 mg total) by mouth daily.   tiZANidine  (ZANAFLEX ) 4 MG tablet Take 1 tablet (4 mg total) by mouth every 8 (eight) hours as needed for muscle spasms.   Vitamin D , Ergocalciferol , (DRISDOL ) 1.25 MG (50000 UNIT) CAPS capsule Take 1 capsule every 7 days.   No facility-administered encounter medications on file as of 03/08/2024.    BP 114/80   Pulse 63   Ht 5' 10 (1.778 m)   Wt 187 lb (84.8 kg)  SpO2 96%   BMI 26.83 kg/m  BP Readings from Last 3 Encounters:  03/08/24 114/80  02/03/24 (!) 150/96  11/22/23 101/62    Physical Exam Constitutional:      Appearance: Normal appearance.  HENT:     Mouth/Throat:     Mouth: Mucous membranes are moist.     Pharynx: Oropharynx is clear.  Cardiovascular:     Rate and Rhythm: Normal rate and regular rhythm.     Heart sounds: No murmur heard.    No friction rub. No gallop.  Pulmonary:     Effort: Pulmonary effort is normal.     Breath sounds: No  rhonchi or rales.  Abdominal:     General: Abdomen is flat. Bowel sounds are normal. There is no distension.     Palpations: Abdomen is soft.     Tenderness: There is no abdominal tenderness.  Musculoskeletal:        General: Normal range of motion.     Right lower leg: No edema.     Left lower leg: No edema.  Skin:    General: Skin is warm and dry.     Capillary Refill: Capillary refill takes less than 2 seconds.  Neurological:     General: No focal deficit present.     Mental Status: She is alert and oriented to person, place, and time.  Psychiatric:        Mood and Affect: Mood normal.        Behavior: Behavior normal.        03/08/2024   11:30 AM 02/03/2024    1:24 PM 09/29/2023    3:11 PM  Depression screen PHQ 2/9  Decreased Interest 2 2 0  Down, Depressed, Hopeless 1 2 0  PHQ - 2 Score 3 4 0  Altered sleeping 2 2   Tired, decreased energy 2 2   Change in appetite 1 1   Feeling bad or failure about yourself  1 2   Trouble concentrating 2 2   Moving slowly or fidgety/restless 1 1   Suicidal thoughts 0 1   PHQ-9 Score 12 15   Difficult doing work/chores  Somewhat difficult        03/08/2024   11:30 AM 02/03/2024    1:24 PM 07/18/2023    2:06 PM 06/27/2023    3:34 PM  GAD 7 : Generalized Anxiety Score  Nervous, Anxious, on Edge 1 2 0 1  Control/stop worrying 1 2 0 1  Worry too much - different things 2 2 0 1  Trouble relaxing 1 2 0 1  Restless 1 2 0 1  Easily annoyed or irritable 2 2 0 2  Afraid - awful might happen 0 1 0 1  Total GAD 7 Score 8 13 0 8  Anxiety Difficulty Somewhat difficult Somewhat difficult Not difficult at all Not difficult at all    No results found for any visits on 03/08/24.  Last CBC Lab Results  Component Value Date   WBC 5.3 11/16/2023   HGB 13.0 11/16/2023   HCT 38.6 11/16/2023   MCV 91.3 11/16/2023   MCH 30.7 11/16/2023   RDW 12.8 11/16/2023   PLT 346 11/16/2023   Last metabolic panel Lab Results  Component Value Date    GLUCOSE 121 (H) 11/16/2023   NA 136 11/16/2023   K 4.3 11/16/2023   CL 103 11/16/2023   CO2 25 11/16/2023   BUN 14 11/16/2023   CREATININE 0.98 11/16/2023   GFRNONAA >60 11/16/2023  CALCIUM  10.0 11/16/2023   PHOS 4.7 (H) 02/02/2021   PROT 6.2 06/09/2022   ALBUMIN  4.3 06/09/2022   LABGLOB 1.9 06/09/2022   AGRATIO 2.3 (H) 06/09/2022   BILITOT <0.2 06/09/2022   ALKPHOS 72 06/09/2022   AST 16 06/09/2022   ALT 12 06/09/2022   ANIONGAP 8 11/16/2023   Last lipids Lab Results  Component Value Date   CHOL 180 07/18/2023   HDL 75 07/18/2023   LDLCALC 74 07/18/2023   TRIG 188 (H) 07/18/2023   CHOLHDL 2.5 11/02/2018      The 10-year ASCVD risk score (Arnett DK, et al., 2019) is: 3.4%    Assessment & Plan:  Depression, recurrent (HCC) Assessment & Plan: Mild improvement on bupropion  300 mg daily, no adverse effects with this.  PHQ9 down to 12 today. Denies SI, HI, AVD. Continue to have significant home stressors which are unlikely to change. Will continue to monitor mood.    Chronic anxiety Assessment & Plan: GAD improved to 8 with increase in bupropion . Continue buspirone  20 mg twice daily and Wellbutrin  300 mg daily.   Essential hypertension Assessment & Plan: BP well controlled day. No elevation since increase bupropion  or with decreasing metoprolol . She's have some orthostatic hypotension when she wakes at night occasionally but overall this is improved. Likely exacerbated by bradycardia in the mid to upper 50s due to her BB. Discussed discontinuing BB but she reports significant symptoms with palpitations regarding this. Continue lisinopril 25 mg daily and metoprolol  succinate 25 mg daily.    Encounter for immunization -     Flu vaccine trivalent PF, 6mos and older(Flulaval,Afluria,Fluarix,Fluzone)  Palpitations Assessment & Plan: Hx of PVCs, having occasional palpitations with decrease in metoprolol  succinate 25 mg daily. Reports HR increased to 100 when she is late  to take BB and she feels short of breath. No palpitations in office today. Discussed heart monitor, she would like to follow up with cardiology at [facility] first. Return precautions reviewed.        Return in about 2 months (around 05/08/2024) for HLD, HTN .    Harlene Saddler, MD

## 2024-03-08 NOTE — Assessment & Plan Note (Addendum)
 GAD improved to 8 with increase in bupropion . Continue buspirone  20 mg twice daily and Wellbutrin  300 mg daily.

## 2024-03-23 ENCOUNTER — Other Ambulatory Visit: Payer: Self-pay

## 2024-03-23 DIAGNOSIS — I10 Essential (primary) hypertension: Secondary | ICD-10-CM

## 2024-03-23 MED ORDER — LOSARTAN POTASSIUM 25 MG PO TABS: 25.0000 mg | ORAL_TABLET | Freq: Every day | ORAL | 1 refills | Status: AC

## 2024-04-02 ENCOUNTER — Other Ambulatory Visit: Payer: Self-pay

## 2024-04-02 DIAGNOSIS — E782 Mixed hyperlipidemia: Secondary | ICD-10-CM

## 2024-04-02 DIAGNOSIS — E559 Vitamin D deficiency, unspecified: Secondary | ICD-10-CM

## 2024-04-02 DIAGNOSIS — N3941 Urge incontinence: Secondary | ICD-10-CM

## 2024-04-02 DIAGNOSIS — F419 Anxiety disorder, unspecified: Secondary | ICD-10-CM

## 2024-04-02 DIAGNOSIS — K219 Gastro-esophageal reflux disease without esophagitis: Secondary | ICD-10-CM

## 2024-04-02 MED ORDER — BUSPIRONE HCL 30 MG PO TABS: 30.0000 mg | ORAL_TABLET | Freq: Two times a day (BID) | ORAL | 1 refills | Status: DC

## 2024-04-02 MED ORDER — OXYBUTYNIN CHLORIDE ER 5 MG PO TB24: ORAL_TABLET | ORAL | 1 refills | Status: AC

## 2024-04-02 MED ORDER — EZETIMIBE 10 MG PO TABS
10.0000 mg | ORAL_TABLET | Freq: Every day | ORAL | 1 refills | Status: AC
Start: 2024-04-02 — End: ?

## 2024-04-02 MED ORDER — ESOMEPRAZOLE MAGNESIUM 40 MG PO CPDR
40.0000 mg | DELAYED_RELEASE_CAPSULE | Freq: Two times a day (BID) | ORAL | 1 refills | Status: AC
Start: 2024-04-02 — End: ?

## 2024-04-09 ENCOUNTER — Other Ambulatory Visit: Payer: Self-pay

## 2024-04-09 DIAGNOSIS — I10 Essential (primary) hypertension: Secondary | ICD-10-CM

## 2024-04-09 MED ORDER — METOPROLOL SUCCINATE ER 25 MG PO TB24: 25.0000 mg | ORAL_TABLET | Freq: Every day | ORAL | 1 refills | Status: AC

## 2024-05-10 ENCOUNTER — Ambulatory Visit: Admitting: Student

## 2024-05-30 ENCOUNTER — Encounter: Payer: Self-pay | Admitting: Student

## 2024-05-30 ENCOUNTER — Ambulatory Visit: Admitting: Student

## 2024-06-25 ENCOUNTER — Other Ambulatory Visit: Payer: Self-pay

## 2024-06-25 DIAGNOSIS — I10 Essential (primary) hypertension: Secondary | ICD-10-CM

## 2024-06-25 MED ORDER — METOPROLOL SUCCINATE ER 25 MG PO TB24: 25.0000 mg | ORAL_TABLET | Freq: Every day | ORAL | 1 refills | Status: AC

## 2024-07-06 ENCOUNTER — Other Ambulatory Visit: Payer: Self-pay

## 2024-07-06 DIAGNOSIS — F419 Anxiety disorder, unspecified: Secondary | ICD-10-CM

## 2024-07-06 MED ORDER — BUSPIRONE HCL 30 MG PO TABS: 30.0000 mg | ORAL_TABLET | Freq: Two times a day (BID) | ORAL | 1 refills | Status: AC

## 2024-07-10 ENCOUNTER — Other Ambulatory Visit: Payer: Self-pay

## 2024-07-10 DIAGNOSIS — F339 Major depressive disorder, recurrent, unspecified: Secondary | ICD-10-CM

## 2024-07-20 ENCOUNTER — Other Ambulatory Visit: Payer: Self-pay
